# Patient Record
Sex: Male | Born: 1985
Health system: Southern US, Community
[De-identification: ages and names within clinical notes are randomized; demographics above are authoritative.]

## PROBLEM LIST (undated history)

## (undated) DIAGNOSIS — M25552 Pain in left hip: Secondary | ICD-10-CM

## (undated) DIAGNOSIS — G4730 Sleep apnea, unspecified: Secondary | ICD-10-CM

## (undated) DIAGNOSIS — R002 Palpitations: Secondary | ICD-10-CM

## (undated) DIAGNOSIS — E785 Hyperlipidemia, unspecified: Secondary | ICD-10-CM

## (undated) DIAGNOSIS — R079 Chest pain, unspecified: Secondary | ICD-10-CM

## (undated) DIAGNOSIS — R0602 Shortness of breath: Secondary | ICD-10-CM

## (undated) DIAGNOSIS — K219 Gastro-esophageal reflux disease without esophagitis: Secondary | ICD-10-CM

## (undated) DIAGNOSIS — F419 Anxiety disorder, unspecified: Secondary | ICD-10-CM

## (undated) DIAGNOSIS — E538 Deficiency of other specified B group vitamins: Secondary | ICD-10-CM

## (undated) DIAGNOSIS — K760 Fatty (change of) liver, not elsewhere classified: Secondary | ICD-10-CM

## (undated) DIAGNOSIS — T7840XA Allergy, unspecified, initial encounter: Secondary | ICD-10-CM

## (undated) DIAGNOSIS — F329 Major depressive disorder, single episode, unspecified: Secondary | ICD-10-CM

## (undated) DIAGNOSIS — F32A Depression, unspecified: Secondary | ICD-10-CM

## (undated) DIAGNOSIS — E559 Vitamin D deficiency, unspecified: Secondary | ICD-10-CM

## (undated) HISTORY — DX: Pain in left hip: M25.552

## (undated) HISTORY — DX: Hyperlipidemia, unspecified: E78.5

## (undated) HISTORY — DX: Deficiency of other specified B group vitamins: E53.8

## (undated) HISTORY — DX: Palpitations: R00.2

## (undated) HISTORY — DX: Depression, unspecified: F32.A

## (undated) HISTORY — DX: Anxiety disorder, unspecified: F41.9

## (undated) HISTORY — DX: Gastro-esophageal reflux disease without esophagitis: K21.9

## (undated) HISTORY — DX: Fatty (change of) liver, not elsewhere classified: K76.0

## (undated) HISTORY — DX: Allergy, unspecified, initial encounter: T78.40XA

## (undated) HISTORY — DX: Vitamin D deficiency, unspecified: E55.9

## (undated) HISTORY — DX: Sleep apnea, unspecified: G47.30

## (undated) HISTORY — DX: Chest pain, unspecified: R07.9

## (undated) HISTORY — PX: HAIR TRANSPLANT: SHX1719

## (undated) HISTORY — DX: Shortness of breath: R06.02

---
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
## 2017-12-15 DIAGNOSIS — F17220 Nicotine dependence, chewing tobacco, uncomplicated: Secondary | ICD-10-CM

## 2017-12-15 DIAGNOSIS — E669 Obesity, unspecified: Secondary | ICD-10-CM

## 2017-12-15 DIAGNOSIS — G4733 Obstructive sleep apnea (adult) (pediatric): Secondary | ICD-10-CM | POA: Insufficient documentation

## 2017-12-15 HISTORY — DX: Obesity, unspecified: E66.9

## 2017-12-15 HISTORY — DX: Nicotine dependence, chewing tobacco, uncomplicated: F17.220

## 2017-12-15 HISTORY — DX: Obstructive sleep apnea (adult) (pediatric): G47.33

## 2018-02-07 DIAGNOSIS — M898X9 Other specified disorders of bone, unspecified site: Secondary | ICD-10-CM

## 2018-02-07 DIAGNOSIS — M899 Disorder of bone, unspecified: Secondary | ICD-10-CM

## 2018-02-07 HISTORY — DX: Other specified disorders of bone, unspecified site: M89.8X9

## 2018-02-07 HISTORY — DX: Disorder of bone, unspecified: M89.9

## 2018-03-15 DIAGNOSIS — M5432 Sciatica, left side: Secondary | ICD-10-CM

## 2018-03-15 HISTORY — DX: Sciatica, left side: M54.32

## 2019-08-15 ENCOUNTER — Other Ambulatory Visit: Payer: Self-pay

## 2019-08-15 ENCOUNTER — Telehealth: Payer: Self-pay

## 2019-08-15 DIAGNOSIS — Z20822 Contact with and (suspected) exposure to covid-19: Secondary | ICD-10-CM

## 2019-08-15 NOTE — Telephone Encounter (Signed)
Patient called in requesting [place] lab resutls - DOB/Address verified - advised results are still pending - testing was just done today. Reviewed testing protocol and turnaround time with patient, no further questions.

## 2019-08-17 LAB — NOVEL CORONAVIRUS, NAA: SARS-CoV-2, NAA: NOT DETECTED

## 2019-11-12 DIAGNOSIS — G8929 Other chronic pain: Secondary | ICD-10-CM

## 2019-11-12 DIAGNOSIS — M25562 Pain in left knee: Secondary | ICD-10-CM

## 2019-11-12 DIAGNOSIS — M5412 Radiculopathy, cervical region: Secondary | ICD-10-CM | POA: Insufficient documentation

## 2019-11-12 DIAGNOSIS — E669 Obesity, unspecified: Secondary | ICD-10-CM | POA: Insufficient documentation

## 2019-11-12 DIAGNOSIS — M542 Cervicalgia: Secondary | ICD-10-CM

## 2019-11-12 HISTORY — DX: Other chronic pain: G89.29

## 2019-11-12 HISTORY — DX: Pain in left knee: M25.562

## 2019-11-12 HISTORY — DX: Cervicalgia: M54.2

## 2019-11-12 HISTORY — DX: Morbid (severe) obesity due to excess calories: E66.01

## 2019-12-25 DIAGNOSIS — E8881 Metabolic syndrome: Secondary | ICD-10-CM

## 2019-12-25 HISTORY — DX: Metabolic syndrome: E88.81

## 2019-12-25 HISTORY — DX: Metabolic syndrome: E88.810

## 2020-02-15 MED FILL — SAXENDA 18 MG/3 ML PEN: 18 | 30 days supply | Qty: 15 | Fill #0

## 2020-02-15 MED FILL — UNIFINE PENTIPS 32GX5/32: 32G X 4 MM | 90 days supply | Qty: 100 | Fill #0

## 2020-02-21 ENCOUNTER — Encounter (INDEPENDENT_AMBULATORY_CARE_PROVIDER_SITE_OTHER): Payer: Self-pay

## 2020-03-17 ENCOUNTER — Ambulatory Visit (HOSPITAL_BASED_OUTPATIENT_CLINIC_OR_DEPARTMENT_OTHER)
Admission: RE | Admit: 2020-03-17 | Discharge: 2020-03-17 | Disposition: A | Discharge status: Home / Self Care | Payer: No Typology Code available for payment source | Source: Ambulatory Visit | Attending: Medical | Admitting: Medical

## 2020-03-17 ENCOUNTER — Encounter: Payer: Self-pay | Admitting: Medical

## 2020-03-17 ENCOUNTER — Other Ambulatory Visit: Payer: Self-pay

## 2020-03-17 ENCOUNTER — Ambulatory Visit (INDEPENDENT_AMBULATORY_CARE_PROVIDER_SITE_OTHER): Payer: No Typology Code available for payment source | Admitting: Medical

## 2020-03-17 VITALS — BP 121/75 | HR 90 | Temp 98.2°F | Resp 18 | Ht 70.0 in | Wt 246.2 lb

## 2020-03-17 DIAGNOSIS — E785 Hyperlipidemia, unspecified: Secondary | ICD-10-CM

## 2020-03-17 DIAGNOSIS — R0981 Nasal congestion: Secondary | ICD-10-CM

## 2020-03-17 DIAGNOSIS — R0781 Pleurodynia: Secondary | ICD-10-CM | POA: Diagnosis not present

## 2020-03-17 DIAGNOSIS — M542 Cervicalgia: Secondary | ICD-10-CM | POA: Diagnosis not present

## 2020-03-17 DIAGNOSIS — E669 Obesity, unspecified: Secondary | ICD-10-CM

## 2020-03-17 DIAGNOSIS — R0600 Dyspnea, unspecified: Secondary | ICD-10-CM

## 2020-03-17 DIAGNOSIS — M546 Pain in thoracic spine: Secondary | ICD-10-CM

## 2020-03-17 DIAGNOSIS — R0789 Other chest pain: Secondary | ICD-10-CM | POA: Diagnosis not present

## 2020-03-17 DIAGNOSIS — R002 Palpitations: Secondary | ICD-10-CM

## 2020-03-17 MED ORDER — ALBUTEROL SULFATE HFA 108 (90 BASE) MCG/ACT IN AERS: 2.0000 | INHALATION_SPRAY | Freq: Four times a day (QID) | RESPIRATORY_TRACT | 0 refills | Status: DC | PRN

## 2020-03-17 MED ORDER — AZELASTINE HCL 0.1 % NA SOLN: 2.0000 | Freq: Two times a day (BID) | NASAL | 2 refills | Status: DC

## 2020-03-17 NOTE — Addendum Note (Signed)
Addended by: Gwenevere Abbot on: 03/17/2020 10:23 AM   Modules accepted: Orders

## 2020-03-17 NOTE — Patient Instructions (Addendum)
You have history of chronic intermittent atypical chest pain for years per your report with some intermittent palpitations as well.  Your EKG today showed normal sinus rhythm.  Work-up previously done in February with [facility] was negative.  They were in the process of referring you to cardiologist as well as pulmonologist since you note some shortness of breath as well.  I went ahead and placed referral to both cardiologist and pulmonologist today.  Recommend that you avoid any caffeinated beverages or any stimulant type products.  Particularly avoid any Sudafed.  Since you do have recent shortness of breath with occasional wheezing will make albuterol inhaler available to use if needed pending pulmonologist referral.  For thoracic back pain that chronic and intermittent with recent failure of PT, I decided to go ahead and refer you to sports medicine.  For obesity, continue Saxenda and referred you to weight loss management clinic.  For hyperlipidemia, recommend low-cholesterol diet and can continue fish oil.  You mention chronic nasal congestion.  You report being on Nasacort and decided go ahead and prescribe Astelin.  If you have any worsening type chest pain or severe shortness of breath prior to referral to specialist and recommend ED evaluation.  Presently pending both referrals I do not want to do any heavy exercises.  Follow-up in 3 weeks or as needed.

## 2020-03-17 NOTE — Addendum Note (Signed)
Addended by: Gwenevere Abbot on: 03/17/2020 10:19 AM   Modules accepted: Orders

## 2020-03-17 NOTE — Progress Notes (Signed)
Subjective:    Patient ID: Brian Obrien, male    DOB: [birth year], 34 y.o.   MRN: [id number]  HPI  Pt in for first time.   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 Pt does not exercise regularly in past but recently started riding bike 3 times a week. Pt states trying to eat healthy. Admits moderate high carbs. Eats chicken and goat. Likes fish. Single. Pt used to smoke but stopped in 2013. Smoked for about 5 ears. Then chewed tobacco for about 7 years. No alcohol.   Pt born and raised in [place].   Pt has high cholesterol in the past.   Pt in past saw weight loss MD. He was on saxenda.  Pt was with [facility] and he was in process of being referred to cardiologist but pulmonologist but never saw them due to insurance change.  He states he has minimal shortness of breath even with minimal activity(had normal cxr in feb 2021). He does feel some nasal congestion for 2 + years.. Uses nasacort. Pt does occasionally hear him self wheeze. Pt gets random intermittent chest pain that will occur 5-6 seconds then subside. Some pleuritic type description as well if takes deep breath will get pain. Some palpitation. Both atypical chest pain and palpitation since 2013.  February saw pcp.   1. Chest pain, unspecified type XR Chest Pa And Lateral  BNP (B-Type Natruiretic Peptide)  BNP (B-Type Natruiretic Peptide)  CANCELED: D-dimer  2. SOB (shortness of breath) XR Chest Pa And Lateral  BNP (B-Type Natruiretic Peptide)  D-dimer  BNP (B-Type Natruiretic Peptide)  D-dimer  CANCELED: D-dimer  3. Chest pain varying with breathing Troponin T  D-dimer  D-dimer  Troponin T  4. Dizziness CBC, Platelet; No Differential  Comprehensive metabolic panel  Comprehensive metabolic panel  CBC, Platelet; No Differential  5. Tachycardia TSH  TSH   P: Patient urged to go to the ER for further evaluation, however he declined this. He is able to present to the [facility] on Monday AM for an EKG. He was urged to be  evaluated prior should his symptoms worsen or persist Labs obtained and pending as patient declined ER evaluation Patient is aware of the risk of declining urgent evaluation at the ER Will follow as patient allows  I reviewed pt lab d dimer which was negative. bnp was negative. Troponin not done. ekg summar state no st changes. Nsr. But I don't actually see the ekg.    Pt also has back pain and saw PT for 7 weeks and mid thoracic pain not improving.  Pt taking tylenol for pain. Was on muscle relaxer in past and sometime motrin 800 mg.   Review of Systems  Constitutional: Negative for chills, fatigue and fever.  HENT: Negative for congestion, drooling, ear discharge, nosebleeds and sneezing.   Respiratory: Negative for cough, chest tightness, shortness of breath and wheezing.   Cardiovascular: Negative for chest pain and palpitations.  Gastrointestinal: Negative for abdominal pain, blood in stool, diarrhea and rectal pain.  Genitourinary: Negative for flank pain, frequency and urgency.  Musculoskeletal: Negative for back pain.  Skin: Negative for rash.  Neurological: Negative for dizziness, speech difficulty, weakness, numbness and headaches.  Hematological: Negative for adenopathy. Does not bruise/bleed easily.  Psychiatric/Behavioral: Negative for behavioral problems, confusion and suicidal ideas. The patient is not nervous/anxious.     No past medical history on file.   Social History   Socioeconomic History  . Marital status: Single  Spouse name: Not on file  . Number of children: Not on file  . Years of education: Not on file  . Highest education level: Not on file  Occupational History  . Not on file  Tobacco Use  . Smoking status: Not on file  Substance and Sexual Activity  . Alcohol use: Not on file  . Drug use: Not on file  . Sexual activity: Not on file  Other Topics Concern  . Not on file  Social History Narrative  . Not on file   Social Determinants of  Health   Financial Resource Strain:   . Difficulty of Paying Living Expenses:   Food Insecurity:   . Worried About Charity fundraiser in the Last Year:   . Arboriculturist in the Last Year:   Transportation Needs:   . Film/video editor (Medical):   Marland Kitchen Lack of Transportation (Non-Medical):   Physical Activity:   . Days of Exercise per Week:   . Minutes of Exercise per Session:   Stress:   . Feeling of Stress :   Social Connections:   . Frequency of Communication with Friends and Family:   . Frequency of Social Gatherings with Friends and Family:   . Attends Religious Services:   . Active Member of Clubs or Organizations:   . Attends Archivist Meetings:   Marland Kitchen Marital Status:   Intimate Partner Violence:   . Fear of Current or Ex-Partner:   . Emotionally Abused:   Marland Kitchen Physically Abused:   . Sexually Abused:      No family history on file.  Not on File  Current Outpatient Medications on File Prior to Visit  Medication Sig Dispense Refill  . Omega-3 Fatty Acids (FISH OIL) 1000 MG CAPS Take by mouth.    Marland Kitchen SAXENDA 18 MG/3ML SOPN Inject 3 mg into the skin daily.     No current facility-administered medications on file prior to visit.    BP 121/75 (BP Location: Left Arm, Patient Position: Sitting, Cuff Size: Large)   Pulse 90   Temp 98.2 F (36.8 C) (Temporal)   Resp 18   Ht 5\' 10"  (1.778 m)   Wt 246 lb 3.2 oz (111.7 kg)   SpO2 99%   BMI 35.33 kg/m       Objective:   Physical Exam  General Mental Status- Alert. General Appearance- Not in acute distress.   Skin General: Color- Normal Color. Moisture- Normal Moisture.  Neck Carotid Arteries- Normal color. Moisture- Normal Moisture. No carotid bruits. No JVD.  Chest and Lung Exam Auscultation: Breath Sounds:-Normal.  Cardiovascular Auscultation:Rythm- Regular. Murmurs & Other Heart Sounds:Auscultation of the heart reveals- No Murmurs.  Abdomen Inspection:-Inspeection  Normal. Palpation/Percussion:Note:No mass. Palpation and Percussion of the abdomen reveal- Non Tender, Non Distended + BS, no rebound or guarding.   Neurologic Cranial Nerve exam:- CN III-XII intact(No nystagmus), symmetric smile. Strength:- 5/5 equal and symmetric strength both upper and lower extremities.  Lower ext- calfs not swollen. No pedal edema. Negative homans signs.        Assessment & Plan:  You have history of chronic intermittent atypical chest pain for years per your report with some intermittent palpitations as well.  Your EKG today showed normal sinus rhythm.  Work-up previously done in February with [facility] was negative.  They were in the process of referring you to cardiologist as well as pulmonologist since you note some shortness of breath as well.  I went ahead and placed  referral to both cardiologist and pulmonologist today.  Recommend that you avoid any caffeinated beverages or any stimulant type products.  Particularly avoid any Sudafed.  Since you do have recent shortness of breath with occasional wheezing will make albuterol inhaler available to use if needed pending pulmonologist referral.  For thoracic back pain that chronic and intermittent with recent failure of PT, I decided to go ahead and refer you to sports medicine.  For obesity, continue Saxenda and referred you to weight loss management clinic.  For hyperlipidemia, recommend low-cholesterol diet and can continue fish oil.  You mention chronic nasal congestion.  You report being on Nasacort and decided go ahead and prescribe Astelin.  If you have any worsening type chest pain or severe shortness of breath prior to referral to specialist and recommend ED evaluation.  Presently pending both referrals I do not want to do any heavy exercises.  Follow-up in 3 weeks or as needed.  Time spent with new patient today was 45  minutes which consisted of chart rediew, discussing various  diagnosis, referrals,  reviewed ekg, treatments discussed and documentation.

## 2020-03-18 ENCOUNTER — Other Ambulatory Visit: Payer: Self-pay

## 2020-03-19 ENCOUNTER — Encounter: Payer: Self-pay | Admitting: Cardiology

## 2020-03-19 ENCOUNTER — Ambulatory Visit (INDEPENDENT_AMBULATORY_CARE_PROVIDER_SITE_OTHER): Payer: No Typology Code available for payment source | Admitting: Cardiology

## 2020-03-19 ENCOUNTER — Other Ambulatory Visit: Payer: Self-pay

## 2020-03-19 VITALS — BP 100/70 | HR 77 | Ht 70.0 in | Wt 244.0 lb

## 2020-03-19 DIAGNOSIS — R0789 Other chest pain: Secondary | ICD-10-CM

## 2020-03-19 DIAGNOSIS — E669 Obesity, unspecified: Secondary | ICD-10-CM

## 2020-03-19 DIAGNOSIS — R002 Palpitations: Secondary | ICD-10-CM

## 2020-03-19 DIAGNOSIS — E8881 Metabolic syndrome: Secondary | ICD-10-CM | POA: Diagnosis not present

## 2020-03-19 DIAGNOSIS — G4733 Obstructive sleep apnea (adult) (pediatric): Secondary | ICD-10-CM

## 2020-03-19 NOTE — Patient Instructions (Signed)
Medication Instructions:  Your physician recommends that you continue on your current medications as directed. Please refer to the Current Medication list given to you today.  *If you need a refill on your cardiac medications before your next appointment, please call your pharmacy*   Lab Work: Your physician recommends that you return for lab work before next appointment: FASTING bmp, mg, tsh, lipids (no appt needed)   If you have labs (blood work) drawn today and your tests are completely normal, you will receive your results only by: Marland Kitchen MyChart Message (if you have MyChart) OR . A paper copy in the mail If you have any lab test that is abnormal or we need to change your treatment, we will call you to review the results.   Testing/Procedures: A zio monitor was ordered today. It will remain on for 7 days. You will then return monitor and event diary in provided box. It takes 1-2 weeks for report to be downloaded and returned to [place]. We will call you with the results. If monitor falls off or has orange flashing light, please call [employer]io for further instructions.   Your physician has requested that you have an echocardiogram. Echocardiography is a painless test that uses sound waves to create images of your heart. It provides your doctor with information about the size and shape of your heart and how well your heart's chambers and valves are working. This procedure takes approximately one hour. There are no restrictions for this procedure.      Follow-Up: At [facility], you and your health needs are our priority.  As part of our continuing mission to provide you with exceptional heart care, we have created designated Provider Care Teams.  These Care Teams include your primary Cardiologist (physician) and Advanced Practice Providers (APPs -  Physician Assistants and Nurse Practitioners) who all work together to provide you with the care you need, when you need it.  We recommend signing up for  the patient portal called "MyChart".  Sign up information is provided on this After Visit Summary.  MyChart is used to connect with patients for Virtual Visits (Telemedicine).  Patients are able to view lab/test results, encounter notes, upcoming appointments, etc.  Non-urgent messages can be sent to your provider as well.   To learn more about what you can do with MyChart, go to [email or web address].    Your next appointment:   2 month(s)  The format for your next appointment:   In Person  Provider:   Berniece Salines, DO   Other Instructions   Echocardiogram An echocardiogram is a procedure that uses painless sound waves (ultrasound) to produce an image of the heart. Images from an echocardiogram can provide important information about:  Signs of coronary artery disease (CAD).  Aneurysm detection. An aneurysm is a weak or damaged part of an artery wall that bulges out from the normal force of blood pumping through the body.  Heart size and shape. Changes in the size or shape of the heart can be associated with certain conditions, including heart failure, aneurysm, and CAD.  Heart muscle function.  Heart valve function.  Signs of a past heart attack.  Fluid buildup around the heart.  Thickening of the heart muscle.  A tumor or infectious growth around the heart valves. Tell a health care provider about:  Any allergies you have.  All medicines you are taking, including vitamins, herbs, eye drops, creams, and over-the-counter medicines.  Any blood disorders you have.  Any surgeries you  have had.  Any medical conditions you have.  Whether you are pregnant or may be pregnant. What are the risks? Generally, this is a safe procedure. However, problems may occur, including:  Allergic reaction to dye (contrast) that may be used during the procedure. What happens before the procedure? No specific preparation is needed. You may eat and drink normally. What happens during  the procedure?   An IV tube may be inserted into one of your veins.  You may receive contrast through this tube. A contrast is an injection that improves the quality of the pictures from your heart.  A gel will be applied to your chest.  A wand-like tool (transducer) will be moved over your chest. The gel will help to transmit the sound waves from the transducer.  The sound waves will harmlessly bounce off of your heart to allow the heart images to be captured in real-time motion. The images will be recorded on a computer. The procedure may vary among health care providers and hospitals. What happens after the procedure?  You may return to your normal, everyday life, including diet, activities, and medicines, unless your health care provider tells you not to do that. Summary  An echocardiogram is a procedure that uses painless sound waves (ultrasound) to produce an image of the heart.  Images from an echocardiogram can provide important information about the size and shape of your heart, heart muscle function, heart valve function, and fluid buildup around your heart.  You do not need to do anything to prepare before this procedure. You may eat and drink normally.  After the echocardiogram is completed, you may return to your normal, everyday life, unless your health care provider tells you not to do that. This information is not intended to replace advice given to you by your health care provider. Make sure you discuss any questions you have with your health care provider. Document Revised: 03/01/2019 Document Reviewed: 12/11/2016 Elsevier Patient Education  2020 [employer].

## 2020-03-19 NOTE — Progress Notes (Signed)
Cardiology Office Note:    Date:  03/19/2020   ID:  Faysal, Fenoglio 1985/12/02, MRN [id number]  PCP:  Esperanza Richters, PA-C  Cardiologist:  Thomasene Ripple, DO  Electrophysiologist:  None   Referring MD: Marisue Brooklyn   Chief Complaint  Patient presents with  . New Patient (Initial Visit)    History of Present Illness:    Brian Obrien is a 34 y.o. male with a hx of hyperlipidemia, reports many years of intermittent palpitations along with chest pain.  Patient tells me that he has been experiencing intermittent palpitations which he describes inform onset of fast heartbeat which last for few minutes at a time.  Mostly he notes that this is happening at night.  In addition the chest pain he describes as a lateral wall under the breast sharp pain that has been going on since 2015.  He notes that this happens mostly when he is driving it occurs within seconds and resolve.  Sometimes is associated with palpitations but not all.  Was more bothersome is the fact that he notes that he has been experiencing shortness of breath since the fall which has not resolved.  At times he tells me that it can occur at rest and also with exertion.  He tells me he has been more anxious recently as he has just gone through a divorce and things had not been completely settled with him. No other complaints at this time.  Past Medical History:  Diagnosis Date  . Allergy   . 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  . Chronic neck pain 11/12/2019  . Chronic pain of left knee 11/12/2019  . Dependence on nicotine from chewing tobacco 12/15/2017  . Hyperlipidemia   . Left sided sciatica 03/15/2018  . Lytic bone lesions on xray 02/07/2018   Formatting of this note might be different from the original. Of right iliac crest.  Seen on x-ray in 1-19 and CT on 02/05/2018. Unchanged xray in 03/2018. Repeat in 6 months (CT vs Xray)  . Metabolic syndrome 12/25/2019  . Obesity (BMI 30-39.9)  12/15/2017  . OSA (obstructive sleep apnea) 12/15/2017   2015; unable to tolerate CPAP  . Severe obesity (BMI 35.0-39.9) with comorbidity (HCC) 11/12/2019    History reviewed. No pertinent surgical history.  Current Medications: Current Meds  Medication Sig  . albuterol (VENTOLIN HFA) 108 (90 Base) MCG/ACT inhaler Inhale 2 puffs into the lungs every 6 (six) hours as needed.  Marland Kitchen azelastine (ASTELIN) 0.1 % nasal spray Place 2 sprays into both nostrils 2 (two) times daily. Use in each nostril as directed  . Cholecalciferol 125 MCG (5000 UT) capsule Take by mouth.  . cyclobenzaprine (FLEXERIL) 10 MG tablet Take 10 mg by mouth at bedtime.  Marland Kitchen loratadine (CLARITIN) 10 MG tablet Take 10 mg by mouth daily.  . Multiple Vitamin (MULTIVITAMIN) tablet Take 1 tablet by mouth every other day.  . Omega-3 Fatty Acids (FISH OIL) 1000 MG CAPS Take by mouth.  Marland Kitchen SAXENDA 18 MG/3ML SOPN Inject 3 mg into the skin daily.     Allergies:   Patient has no allergy information on record.   Social History   Socioeconomic History  . Marital status: Single    Spouse name: Not on file  . Number of children: Not on file  . Years of education: Not on file  . Highest education level: Not on file  Occupational History  . Occupation: Set designer  Tobacco Use  . Smoking status:  Former Smoker    Packs/day: 0.25    Types: Cigarettes    Quit date: 03/06/2012    Years since quitting: 8.0  . Smokeless tobacco: Former Systems developer    Types: [place] date: 03/17/2016  Substance and Sexual Activity  . Alcohol use: Not Currently  . Drug use: Never  . Sexual activity: Not on file  Other Topics Concern  . Not on file  Social History Narrative  . Not on file   Social Determinants of Health   Financial Resource Strain:   . Difficulty of Paying Living Expenses:   Food Insecurity:   . Worried About Charity fundraiser in the Last Year:   . Arboriculturist in the Last Year:   Transportation Needs:   . Film/video editor  (Medical):   Marland Kitchen Lack of Transportation (Non-Medical):   Physical Activity:   . Days of Exercise per Week:   . Minutes of Exercise per Session:   Stress:   . Feeling of Stress :   Social Connections:   . Frequency of Communication with Friends and Family:   . Frequency of Social Gatherings with Friends and Family:   . Attends Religious Services:   . Active Member of Clubs or Organizations:   . Attends Archivist Meetings:   Marland Kitchen Marital Status:      Family History: The patient's family history is not on file.  ROS:   Review of Systems  Constitution: Negative for decreased appetite, fever and weight gain.  HENT: Negative for congestion, ear discharge, hoarse voice and sore throat.   Eyes: Negative for discharge, redness, vision loss in right eye and visual halos.  Cardiovascular: Negative for chest pain, dyspnea on exertion, leg swelling, orthopnea and palpitations.  Respiratory: Negative for cough, hemoptysis, shortness of breath and snoring.   Endocrine: Negative for heat intolerance and polyphagia.  Hematologic/Lymphatic: Negative for bleeding problem. Does not bruise/bleed easily.  Skin: Negative for flushing, nail changes, rash and suspicious lesions.  Musculoskeletal: Negative for arthritis, joint pain, muscle cramps, myalgias, neck pain and stiffness.  Gastrointestinal: Negative for abdominal pain, bowel incontinence, diarrhea and excessive appetite.  Genitourinary: Negative for decreased libido, genital sores and incomplete emptying.  Neurological: Negative for brief paralysis, focal weakness, headaches and loss of balance.  Psychiatric/Behavioral: Negative for altered mental status, depression and suicidal ideas.  Allergic/Immunologic: Negative for HIV exposure and persistent infections.    EKGs/Labs/Other Studies Reviewed:    The following studies were reviewed today:   EKG:  The ekg ordered today demonstrates   Recent Labs: No results found for requested  labs within last 8760 hours.  Recent Lipid Panel No results found for: CHOL, TRIG, HDL, CHOLHDL, VLDL, LDLCALC, LDLDIRECT  Physical Exam:    VS:  BP 100/70   Pulse 77   Ht 5\' 10"  (1.778 m)   Wt 244 lb (110.7 kg)   SpO2 99%   BMI 35.01 kg/m     Wt Readings from Last 3 Encounters:  03/19/20 244 lb (110.7 kg)  03/17/20 246 lb 3.2 oz (111.7 kg)     GEN: Well nourished, well developed in no acute distress HEENT: Normal NECK: No JVD; No carotid bruits LYMPHATICS: No lymphadenopathy CARDIAC: S1S2 noted,RRR, no murmurs, rubs, gallops RESPIRATORY:  Clear to auscultation without rales, wheezing or rhonchi  ABDOMEN: Soft, non-tender, non-distended, +bowel sounds, no guarding. EXTREMITIES: No edema, No cyanosis, no clubbing MUSCULOSKELETAL:  No deformity  SKIN: Warm and dry NEUROLOGIC:  Alert  and oriented x 3, non-focal PSYCHIATRIC:  Normal affect, good insight  ASSESSMENT:    1. Palpitations   2. Atypical chest pain   3. Metabolic syndrome   4. Obesity (BMI 30-39.9)   5. Severe obesity (BMI 35.0-39.9) with comorbidity (HCC)   6. OSA (obstructive sleep apnea)    PLAN:   1.5 I would like to rule out a cardiovascular etiology of this palpitation, therefore at this time I would like to placed a zio patch for  7 days. In additon a transthoracic echocardiogram will be ordered to assess LV/RV function and any structural abnormalities. Once these testing have been performed amd reviewed further reccomendations will be made. For now, I do reccomend that the patient goes to the nearest ED if  symptoms recur.  His chest pain does sound atypical.  For now we will review the echocardiogram for any wall motion abnormalities.  We will continue to monitor symptoms persist will be assess the need for further evaluation for his atypical chest pain.  Obesity-the patient understands the need to lose weight with diet and exercise. We have discussed specific strategies for this.  OSA-currently not  compliant with CPAP.  Did discuss with the patient importance of using the CPAP.  Lab work will be done today which would include BMP, mag, TSH and lipid profile.  The patient is in agreement with the above plan. The patient left the office in stable condition.  The patient will follow up in 3 months or sooner if needed.   Medication Adjustments/Labs and Tests Ordered: Current medicines are reviewed at length with the patient today.  Concerns regarding medicines are outlined above.  Orders Placed This Encounter  Procedures  . Basic metabolic panel  . Magnesium  . TSH  . Lipid Profile  . LONG TERM MONITOR (3-14 DAYS)  . EKG 12-Lead  . ECHOCARDIOGRAM COMPLETE   No orders of the defined types were placed in this encounter.   Patient Instructions  Medication Instructions:  Your physician recommends that you continue on your current medications as directed. Please refer to the Current Medication list given to you today.  *If you need a refill on your cardiac medications before your next appointment, please call your pharmacy*   Lab Work: Your physician recommends that you return for lab work before next appointment: FASTING bmp, mg, tsh, lipids (no appt needed)   If you have labs (blood work) drawn today and your tests are completely normal, you will receive your results only by: Marland Kitchen. MyChart Message (if you have MyChart) OR . A paper copy in the mail If you have any lab test that is abnormal or we need to change your treatment, we will call you to review the results.   Testing/Procedures: A zio monitor was ordered today. It will remain on for 7 days. You will then return monitor and event diary in provided box. It takes 1-2 weeks for report to be downloaded and returned to us. We will call you with the results. If monitor falls off or has orange flashing light, please call Zio for further instructions.   Your physician has requested that you have an echocardiogram. Echocardiography  is a painless test that uses sound waves to create images of your heart. It provides your doctor with information about the size and shape of your heart and how well your heart's chambers and valves are working. This procedure takes approximately one hour. There are no restrictions for this procedure.      Follow-Up:  At [facility], you and your health needs are our priority.  As part of our continuing mission to provide you with exceptional heart care, we have created designated Provider Care Teams.  These Care Teams include your primary Cardiologist (physician) and Advanced Practice Providers (APPs -  Physician Assistants and Nurse Practitioners) who all work together to provide you with the care you need, when you need it.  We recommend signing up for the patient portal called "MyChart".  Sign up information is provided on this After Visit Summary.  MyChart is used to connect with patients for Virtual Visits (Telemedicine).  Patients are able to view lab/test results, encounter notes, upcoming appointments, etc.  Non-urgent messages can be sent to your provider as well.   To learn more about what you can do with MyChart, go to [email or web address].    Your next appointment:   2 month(s)  The format for your next appointment:   In Person  Provider:   Thomasene Ripple, DO   Other Instructions   Echocardiogram An echocardiogram is a procedure that uses painless sound waves (ultrasound) to produce an image of the heart. Images from an echocardiogram can provide important information about:  Signs of coronary artery disease (CAD).  Aneurysm detection. An aneurysm is a weak or damaged part of an artery wall that bulges out from the normal force of blood pumping through the body.  Heart size and shape. Changes in the size or shape of the heart can be associated with certain conditions, including heart failure, aneurysm, and CAD.  Heart muscle function.  Heart valve function.  Signs  of a past heart attack.  Fluid buildup around the heart.  Thickening of the heart muscle.  A tumor or infectious growth around the heart valves. Tell a health care provider about:  Any allergies you have.  All medicines you are taking, including vitamins, herbs, eye drops, creams, and over-the-counter medicines.  Any blood disorders you have.  Any surgeries you have had.  Any medical conditions you have.  Whether you are pregnant or may be pregnant. What are the risks? Generally, this is a safe procedure. However, problems may occur, including:  Allergic reaction to dye (contrast) that may be used during the procedure. What happens before the procedure? No specific preparation is needed. You may eat and drink normally. What happens during the procedure?   An IV tube may be inserted into one of your veins.  You may receive contrast through this tube. A contrast is an injection that improves the quality of the pictures from your heart.  A gel will be applied to your chest.  A wand-like tool (transducer) will be moved over your chest. The gel will help to transmit the sound waves from the transducer.  The sound waves will harmlessly bounce off of your heart to allow the heart images to be captured in real-time motion. The images will be recorded on a computer. The procedure may vary among health care providers and hospitals. What happens after the procedure?  You may return to your normal, everyday life, including diet, activities, and medicines, unless your health care provider tells you not to do that. Summary  An echocardiogram is a procedure that uses painless sound waves (ultrasound) to produce an image of the heart.  Images from an echocardiogram can provide important information about the size and shape of your heart, heart muscle function, heart valve function, and fluid buildup around your heart.  You do not need to do anything  to prepare before this procedure.  You may eat and drink normally.  After the echocardiogram is completed, you may return to your normal, everyday life, unless your health care provider tells you not to do that. This information is not intended to replace advice given to you by your health care provider. Make sure you discuss any questions you have with your health care provider. Document Revised: 03/01/2019 Document Reviewed: 12/11/2016 Elsevier Patient Education  2020 [employer].      Adopting a Healthy Lifestyle.  Know what a healthy weight is for you (roughly BMI <25) and aim to maintain this   Aim for 7+ servings of fruits and vegetables daily   65-80+ fluid ounces of water or unsweet tea for healthy kidneys   Limit to max 1 drink of alcohol per day; avoid smoking/tobacco   Limit animal fats in diet for cholesterol and heart health - choose grass fed whenever available   Avoid highly processed foods, and foods high in saturated/trans fats   Aim for low stress - take time to unwind and care for your mental health   Aim for 150 min of moderate intensity exercise weekly for heart health, and weights twice weekly for bone health   Aim for 7-9 hours of sleep daily   When it comes to diets, agreement about the perfect plan isnt easy to find, even among the experts. Experts at the [facility] of [employer] developed an idea known as the Healthy Eating Plate. Just imagine a plate divided into logical, healthy portions.   The emphasis is on diet quality:   Load up on vegetables and fruits - one-half of your plate: Aim for color and variety, and remember that potatoes dont count.   Go for whole grains - one-quarter of your plate: Whole wheat, barley, wheat berries, quinoa, oats, brown rice, and foods made with them. If you want pasta, go with whole wheat pasta.   Protein power - one-quarter of your plate: Fish, chicken, beans, and nuts are all healthy, versatile protein sources. Limit red meat.   The  diet, however, does go beyond the plate, offering a few other suggestions.   Use healthy plant oils, such as olive, canola, soy, corn, sunflower and peanut. Check the labels, and avoid partially hydrogenated oil, which have unhealthy trans fats.   If youre thirsty, drink water. Coffee and tea are good in moderation, but skip sugary drinks and limit milk and dairy products to one or two daily servings.   The type of carbohydrate in the diet is more important than the amount. Some sources of carbohydrates, such as vegetables, fruits, whole grains, and beans-are healthier than others.   Finally, stay active  Signed, Thomasene Ripple, DO  03/19/2020 8:04 PM     [facility]

## 2020-03-20 ENCOUNTER — Telehealth: Payer: Self-pay | Admitting: Emergency Medicine

## 2020-03-20 LAB — LIPID PANEL
Chol/HDL Ratio: 4.6 ratio (ref 0.0–5.0)
Cholesterol, Total: 161 mg/dL (ref 100–199)
HDL: 35 mg/dL — ABNORMAL LOW (ref >39)
LDL Chol Calc (NIH): 96 mg/dL (ref 0–99)
Triglycerides: 175 mg/dL — ABNORMAL HIGH (ref 0–149)
VLDL Cholesterol Cal: 30 mg/dL (ref 5–40)

## 2020-03-20 LAB — BASIC METABOLIC PANEL
BUN/Creatinine Ratio: 16 (ref 9–20)
BUN: 14 mg/dL (ref 6–20)
CO2: 26 mmol/L (ref 20–29)
Calcium: 10.1 mg/dL (ref 8.7–10.2)
Chloride: 100 mmol/L (ref 96–106)
Creatinine, Ser: 0.88 mg/dL (ref 0.76–1.27)
GFR calc Af Amer: 132 mL/min/{1.73_m2} (ref >59)
GFR calc non Af Amer: 114 mL/min/{1.73_m2} (ref >59)
Glucose: 77 mg/dL (ref 65–99)
Potassium: 4.1 mmol/L (ref 3.5–5.2)
Sodium: 142 mmol/L (ref 134–144)

## 2020-03-20 LAB — MAGNESIUM: Magnesium: 2.3 mg/dL (ref 1.6–2.3)

## 2020-03-20 LAB — TSH: TSH: 3.22 u[IU]/mL (ref 0.450–4.500)

## 2020-03-20 NOTE — Telephone Encounter (Signed)
Called patient with results of lab work. During the call patient also agreed to have monitor mailed to him and he will wear for a 5 day period at the end eof May 2021 when he will be off work. Will register and have this mailed to him.

## 2020-03-26 ENCOUNTER — Ambulatory Visit (HOSPITAL_BASED_OUTPATIENT_CLINIC_OR_DEPARTMENT_OTHER)
Admission: RE | Admit: 2020-03-26 | Discharge: 2020-03-26 | Disposition: A | Discharge status: Home / Self Care | Payer: No Typology Code available for payment source | Source: Ambulatory Visit | Attending: Cardiology | Admitting: Cardiology

## 2020-03-26 ENCOUNTER — Telehealth: Payer: Self-pay

## 2020-03-26 ENCOUNTER — Other Ambulatory Visit: Payer: Self-pay

## 2020-03-26 DIAGNOSIS — G4733 Obstructive sleep apnea (adult) (pediatric): Secondary | ICD-10-CM | POA: Insufficient documentation

## 2020-03-26 DIAGNOSIS — R002 Palpitations: Secondary | ICD-10-CM | POA: Diagnosis not present

## 2020-03-26 DIAGNOSIS — R0789 Other chest pain: Secondary | ICD-10-CM | POA: Diagnosis not present

## 2020-03-26 NOTE — Telephone Encounter (Signed)
-----   Message from Thomasene Ripple, DO sent at 03/26/2020 11:21 AM EDT ----- Good results-your echo is normal.

## 2020-03-26 NOTE — Progress Notes (Signed)
  Echocardiogram 2D Echocardiogram has been performed.  Sinda Du 03/26/2020, 9:09 AM

## 2020-03-26 NOTE — Telephone Encounter (Signed)
Spoke with patient regarding results.  Patient verbalizes understanding and is agreeable to plan of care. Advised patient to call back with any issues or concerns.  

## 2020-03-31 ENCOUNTER — Encounter: Payer: Self-pay | Admitting: Medical

## 2020-03-31 NOTE — Telephone Encounter (Signed)
Weight loss clinic referral was not dropped last OV

## 2020-04-01 ENCOUNTER — Telehealth: Payer: Self-pay | Admitting: Medical

## 2020-04-02 ENCOUNTER — Telehealth: Payer: Self-pay | Admitting: Medical

## 2020-04-02 DIAGNOSIS — E669 Obesity, unspecified: Secondary | ICD-10-CM

## 2020-04-02 MED ORDER — SAXENDA 18 MG/3ML ~~LOC~~ SOPN: 3.0000 mg | PEN_INJECTOR | Freq: Every day | SUBCUTANEOUS | 1 refills | Status: DC

## 2020-04-02 NOTE — Telephone Encounter (Signed)
Rx saxenda refill sent to pt pharmacy.

## 2020-04-03 ENCOUNTER — Ambulatory Visit: Payer: No Typology Code available for payment source | Admitting: Family Medicine

## 2020-04-03 ENCOUNTER — Other Ambulatory Visit: Payer: Self-pay

## 2020-04-03 VITALS — BP 122/76 | Ht 70.0 in | Wt 245.0 lb

## 2020-04-03 DIAGNOSIS — M542 Cervicalgia: Secondary | ICD-10-CM | POA: Diagnosis not present

## 2020-04-03 DIAGNOSIS — G8929 Other chronic pain: Secondary | ICD-10-CM

## 2020-04-03 DIAGNOSIS — M5442 Lumbago with sciatica, left side: Secondary | ICD-10-CM | POA: Diagnosis not present

## 2020-04-03 DIAGNOSIS — M2559 Pain in other specified joint: Secondary | ICD-10-CM

## 2020-04-03 DIAGNOSIS — M2550 Pain in unspecified joint: Secondary | ICD-10-CM | POA: Insufficient documentation

## 2020-04-03 MED ORDER — GABAPENTIN 300 MG PO CAPS: 300.0000 mg | ORAL_CAPSULE | Freq: Three times a day (TID) | ORAL | 1 refills | Status: DC

## 2020-04-03 NOTE — Assessment & Plan Note (Signed)
Has tried physical therapy and muscle relaxers.  Appears to have loss of his cervical lordosis. -Counseled on home exercise therapy and supportive care. -MRI to evaluate for degenerative changes or nerve impingement.

## 2020-04-03 NOTE — Progress Notes (Signed)
Brian Obrien - 34 y.o. male MRN [id number]  Date of birth: 1986/11/18  SUBJECTIVE:  Including CC & ROS.  No chief complaint on file.   Brian Obrien is a 34 y.o. male that is presenting with acute on chronic neck pain and acute on chronic low back pain.  The pain is been ongoing several years.  He has tried physical therapy and different medications.  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  Denies any radicular symptoms.  The low back pain is occurring mainly on the left side.  It is worse in the morning and he is stiff.  Through the course the day it improves.  He reports a possible hemangioma in one of the lumbar vertebra's.  Has some left-sided sciatica intermittently..  Review of the lumbar spine x-ray from 11/12/2019 shows mild disc narrowing at T12-L1 and L1-L2.  There are mild degenerative changes of the right SI joint. Independent review of the cervical spine x-ray from 4/26 shows loss of the cervical lordosis.  Review of Systems See HPI   HISTORY: Past Medical, Surgical, Social, and Family History Reviewed & Updated per EMR.   Pertinent Historical Findings include:  Past Medical History:  Diagnosis Date  . Allergy   . 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  . Chronic neck pain 11/12/2019  . Chronic pain of left knee 11/12/2019  . Dependence on nicotine from chewing tobacco 12/15/2017  . Hyperlipidemia   . Left sided sciatica 03/15/2018  . Lytic bone lesions on xray 02/07/2018   Formatting of this note might be different from the original. Of right iliac crest.  Seen on x-ray in 1-19 and CT on 02/05/2018. Unchanged xray in 03/2018. Repeat in 6 months (CT vs Xray)  . Metabolic syndrome 12/25/2019  . Obesity (BMI 30-39.9) 12/15/2017  . OSA (obstructive sleep apnea) 12/15/2017   2015; unable to tolerate CPAP  . Severe obesity (BMI 35.0-39.9) with comorbidity (HCC) 11/12/2019    No past surgical  history on file.  No family history on file.  Social History   Socioeconomic History  . Marital status: Single    Spouse name: Not on file  . Number of children: Not on file  . Years of education: Not on file  . Highest education level: Not on file  Occupational History  . Occupation: Set designer  Tobacco Use  . Smoking status: Former Smoker    Packs/day: 0.25    Types: Cigarettes    Quit date: 03/06/2012    Years since quitting: 8.0  . Smokeless tobacco: Former Neurosurgeon    Types: Chew    Quit date: 03/17/2016  Substance and Sexual Activity  . Alcohol use: Not Currently  . Drug use: Never  . Sexual activity: Not on file  Other Topics Concern  . Not on file  Social History Narrative  . Not on file   Social Determinants of Health   Financial Resource Strain:   . Difficulty of Paying Living Expenses:   Food Insecurity:   . Worried About Programme researcher, broadcasting/film/video in the Last Year:   . Barista in the Last Year:   Transportation Needs:   . Freight forwarder (Medical):   Marland Kitchen Lack of Transportation (Non-Medical):   Physical Activity:   . Days of Exercise per Week:   . Minutes of Exercise per Session:   Stress:   . Feeling of Stress :   Social Connections:   .  Frequency of Communication with Friends and Family:   . Frequency of Social Gatherings with Friends and Family:   . Attends Religious Services:   . Active Member of Clubs or Organizations:   . Attends Archivist Meetings:   Marland Kitchen Marital Status:   Intimate Partner Violence:   . Fear of Current or Ex-Partner:   . Emotionally Abused:   Marland Kitchen Physically Abused:   . Sexually Abused:      PHYSICAL EXAM:  VS: BP 122/76   Ht 5\' 10"  (1.778 m)   Wt 245 lb (111.1 kg)   BMI 35.15 kg/m  Physical Exam Gen: NAD, alert, cooperative with exam, well-appearing MSK:  Neck: Normal range of motion in flexion extension. Normal lateral rotation. Near the midline lumbar spine and the L1-L2 region. Tender to palpation  over the trapezius near the lower cervical spine. Normal shoulder range of motion. Normal strength resistance with shrug. Back: Normal flexion and extension. Normal hip flexion. Negative straight leg raise. Neurovascularly intact     ASSESSMENT & PLAN:   Chronic neck pain Has tried physical therapy and muscle relaxers.  Appears to have loss of his cervical lordosis. -Counseled on home exercise therapy and supportive care. -MRI to evaluate for degenerative changes or nerve impingement.  Chronic bilateral low back pain with left-sided sciatica Pain is acute on chronic in nature.  Has been ongoing for several years.  He has tried physical therapy medications. -Counseled on home exercise therapy and supportive care. -Initiate gabapentin. -MRI to evaluate for possible hemangioma and nerve impingement  Joint pain Seems to have different pains through his back and knee.  Has some degenerative changes in the SI joint on imaging. -ANA, sed rate and CRP.

## 2020-04-03 NOTE — Assessment & Plan Note (Signed)
Seems to have different pains through his back and knee.  Has some degenerative changes in the SI joint on imaging. -ANA, sed rate and CRP.

## 2020-04-03 NOTE — Telephone Encounter (Signed)
Rx saxenda sent to pharmacy.

## 2020-04-03 NOTE — Patient Instructions (Signed)
Nice to meet you Please try the exercises  Please try heat  Please try the gabapentin. Start with one pill at night. It may make you drowsy. Then you can increase to 2 pills or three pills as you tolerate.  I will call with the lab results.  Please send me a message in MyChart with any questions or updates.  We will set up a virtual visit once the MRIs are resulted.   --Dr. Jordan Likes

## 2020-04-03 NOTE — Assessment & Plan Note (Signed)
Pain is acute on chronic in nature.  Has been ongoing for several years.  He has tried physical therapy medications. -Counseled on home exercise therapy and supportive care. -Initiate gabapentin. -MRI to evaluate for possible hemangioma and nerve impingement

## 2020-04-03 NOTE — Telephone Encounter (Signed)
Opened to review 

## 2020-04-05 LAB — ANA,IFA RA DIAG PNL W/RFLX TIT/PATN
ANA Titer 1: NEGATIVE
Cyclic Citrullin Peptide Ab: 4 units (ref 0–19)
Rheumatoid fact SerPl-aCnc: <10 IU/mL (ref 0.0–13.9)

## 2020-04-05 LAB — SEDIMENTATION RATE: Sed Rate: 38 mm/hr — ABNORMAL HIGH (ref 0–15)

## 2020-04-05 LAB — C-REACTIVE PROTEIN: CRP: 4 mg/L (ref 0–10)

## 2020-04-07 ENCOUNTER — Telehealth: Payer: Self-pay | Admitting: Family Medicine

## 2020-04-07 NOTE — Telephone Encounter (Signed)
Left VM for patient. If he calls back please have hinm speak with a nurse/CMA and inform that his labs were normal other than his sedimentation rate.  This is a nonspecific inflammatory marker.  It may correlate to something that we see in the MRIs so we will have to wait and see what they show..   If any questions then please take the best time and phone number to call and I will try to call him back.   Myra Rude, MD [facility] 04/07/2020, 10:24 AM

## 2020-04-09 ENCOUNTER — Ambulatory Visit: Payer: No Typology Code available for payment source | Admitting: Medical

## 2020-04-09 DIAGNOSIS — Z0289 Encounter for other administrative examinations: Secondary | ICD-10-CM

## 2020-04-14 ENCOUNTER — Ambulatory Visit (INDEPENDENT_AMBULATORY_CARE_PROVIDER_SITE_OTHER): Payer: No Typology Code available for payment source

## 2020-04-14 DIAGNOSIS — R002 Palpitations: Secondary | ICD-10-CM | POA: Diagnosis not present

## 2020-04-23 ENCOUNTER — Encounter: Payer: Self-pay | Admitting: Cardiology

## 2020-04-25 ENCOUNTER — Ambulatory Visit: Payer: No Typology Code available for payment source | Admitting: Pulmonary Disease

## 2020-04-25 ENCOUNTER — Encounter: Payer: Self-pay | Admitting: Pulmonary Disease

## 2020-04-25 ENCOUNTER — Other Ambulatory Visit: Payer: Self-pay

## 2020-04-25 VITALS — BP 128/82 | HR 115 | Temp 97.6°F | Ht 69.0 in | Wt 245.2 lb

## 2020-04-25 DIAGNOSIS — R0683 Snoring: Secondary | ICD-10-CM

## 2020-04-25 DIAGNOSIS — R0602 Shortness of breath: Secondary | ICD-10-CM

## 2020-04-25 NOTE — Progress Notes (Signed)
Brian Obrien, Critical Care, and Sleep Medicine  Chief Complaint  Patient presents with  . Consult    constant SOB for almost 1 year    Constitutional:  BP 128/82 (BP Location: Left Arm, Cuff Size: Large)   Pulse (!) 115   Temp 97.6 F (36.4 C) (Oral)   Ht 5\' 9"  (1.753 m)   Wt 245 lb 3.2 oz (111.2 kg)   SpO2 95%   BMI 36.21 kg/m   Past Medical History:  HLD, Sciatica, Neck pain, HLD  Summary:  Brian Obrien is a 34 y.o. male former smoker with dyspnea.  Subjective:   His shortness of breath has been getting progressively worse over the past year.  This can happen at rest.  He feels a fullness in his chest that is improved when he leans forward.  He feels worse after he eats a meal.  He is not having cough, wheeze, or sputum.  Has chronic allergies.  No history of pneumonia or TB.  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  Hasn't noticed any problem with muscle strength.  Not having leg swelling.  No skin rashes currently.  Has tried albuterol w/o benefit.  His father has COPD.  He quit smoking several years ago.  He was diagnosed with sleep apnea several years ago and was on CPAP.  He had trouble tolerating the mask and hasn't used in years.  He still snores, and wakes up feeling short of breath.  He does get sleepy during the day.  Chest xray from 01/03/20 was normal.  Labs from 01/03/20 Hb 15.5, BNP 3.2, Creatinine 0.75, CO2 20, LFT normal, TSH 1.48, D dimer < 0.2.  Labs from 04/03/20 ANA and RF negative.  Physical Exam:   Appearance - well kempt  ENMT - no sinus tenderness, no nasal discharge, no oral exudate, Mallampati 4, scalloped tongue  Respiratory - no wheeze, or rales  CV - regular rate and rhythm, no murmurs  GI - soft, non tender  Lymph - no adenopathy noted in neck  Ext - no edema  Skin - no rashes  Neuro - normal strength, oriented x 3  Psych - normal mood and affect  Discussion:  He has persistent shortness of breath with exertion and some at rest.  It is not  clear to me what the cause of this is.  His lab testing, chest xray and echocardiogram have been unrevealing.  He is obese and could have a component of deconditioning.  I did not appreciate any muscle weakness that would indicate a neuromuscular cause of his dyspnea.  He does have snoring, sleep disruption, apnea, and daytime sleepiness.  He has prior history of sleep apnea, but had trouble tolerating CPAP mask.  I am concerned he still has sleep apnea.  Assessment/Plan:   Dyspnea on exertion. - will get Obrien function testing and CT chest with IV contrast - further interventions based on these test results  Snoring with history of obstructive sleep apnea. - will arrange for home sleep study  A total of 47 minutes addressing patient care on the day of the visit.  Follow up:  Patient Instructions  Will schedule Obrien function test, CT chest with IV contrast, and home sleep study  Follow up in 6 weeks   Signature:  04/05/20, MD [facility] Obrien/Critical Care Pager: [phone] 04/25/2020, 5:23 PM  Flow Sheet     Obrien tests:    Sleep tests:    Cardiac tests:  Echo 03/26/20 >> EF 60 to 65%  Medications:   Allergies as of 04/25/2020   No Known Allergies     Medication List       Accurate as of April 25, 2020  5:23 PM. If you have any questions, ask your nurse or doctor.        albuterol 108 (90 Base) MCG/ACT inhaler Commonly known as: VENTOLIN HFA Inhale 2 puffs into the lungs every 6 (six) hours as needed.   azelastine 0.1 % nasal spray Commonly known as: ASTELIN Place 2 sprays into both nostrils 2 (two) times daily. Use in each nostril as directed   Cholecalciferol 125 MCG (5000 UT) capsule Take by mouth.   cyclobenzaprine 10 MG tablet Commonly known as: FLEXERIL Take 10 mg by mouth at bedtime.   Fish Oil 1000 MG Caps Take by mouth.   gabapentin 300 MG capsule Commonly known as: NEURONTIN Take 1 capsule (300 mg total) by mouth 3  (three) times daily.   loratadine 10 MG tablet Commonly known as: CLARITIN Take 10 mg by mouth daily.   multivitamin tablet Take 1 tablet by mouth every other day.   Saxenda 18 MG/3ML Sopn Generic drug: Liraglutide -Weight Management Inject 0.5 mLs (3 mg total) into the skin daily.       Past Surgical History:  He denies prior surgeries.  Family History:  His family history includes COPD in his father.  Social History:  He  reports that he quit smoking about 8 years ago. His smoking use included cigarettes. He smoked 0.25 packs per day. He quit smokeless tobacco use about 4 years ago.  His smokeless tobacco use included chew. He reports previous alcohol use. He reports that he does not use drugs.

## 2020-04-25 NOTE — Patient Instructions (Signed)
Will schedule pulmonary function test, CT chest with IV contrast, and home sleep study  Follow up in 6 weeks

## 2020-04-29 ENCOUNTER — Telehealth: Payer: Self-pay | Admitting: Family Medicine

## 2020-04-29 NOTE — Telephone Encounter (Signed)
Spoke to patient and gave result information as provided by the physician. 

## 2020-04-29 NOTE — Telephone Encounter (Signed)
Patient returning call for lab results. 

## 2020-05-05 ENCOUNTER — Ambulatory Visit
Admission: RE | Admit: 2020-05-05 | Discharge: 2020-05-05 | Disposition: A | Discharge status: Home / Self Care | Payer: No Typology Code available for payment source | Source: Ambulatory Visit | Attending: Family Medicine | Admitting: Family Medicine

## 2020-05-05 ENCOUNTER — Other Ambulatory Visit: Payer: Self-pay

## 2020-05-05 DIAGNOSIS — M5442 Lumbago with sciatica, left side: Secondary | ICD-10-CM

## 2020-05-05 DIAGNOSIS — M542 Cervicalgia: Secondary | ICD-10-CM

## 2020-05-09 ENCOUNTER — Ambulatory Visit (HOSPITAL_BASED_OUTPATIENT_CLINIC_OR_DEPARTMENT_OTHER)
Admission: RE | Admit: 2020-05-09 | Discharge: 2020-05-09 | Disposition: A | Discharge status: Home / Self Care | Payer: No Typology Code available for payment source | Source: Ambulatory Visit | Attending: Pulmonary Disease | Admitting: Pulmonary Disease

## 2020-05-09 ENCOUNTER — Ambulatory Visit (HOSPITAL_BASED_OUTPATIENT_CLINIC_OR_DEPARTMENT_OTHER): Payer: No Typology Code available for payment source

## 2020-05-09 ENCOUNTER — Telehealth (INDEPENDENT_AMBULATORY_CARE_PROVIDER_SITE_OTHER): Payer: No Typology Code available for payment source | Admitting: Family Medicine

## 2020-05-09 ENCOUNTER — Encounter (HOSPITAL_BASED_OUTPATIENT_CLINIC_OR_DEPARTMENT_OTHER): Payer: Self-pay

## 2020-05-09 ENCOUNTER — Other Ambulatory Visit: Payer: Self-pay

## 2020-05-09 DIAGNOSIS — G8929 Other chronic pain: Secondary | ICD-10-CM | POA: Diagnosis not present

## 2020-05-09 DIAGNOSIS — M5442 Lumbago with sciatica, left side: Secondary | ICD-10-CM | POA: Diagnosis not present

## 2020-05-09 DIAGNOSIS — M542 Cervicalgia: Secondary | ICD-10-CM

## 2020-05-09 DIAGNOSIS — R0602 Shortness of breath: Secondary | ICD-10-CM | POA: Diagnosis not present

## 2020-05-09 DIAGNOSIS — M25572 Pain in left ankle and joints of left foot: Secondary | ICD-10-CM | POA: Insufficient documentation

## 2020-05-09 MED ORDER — IOHEXOL 300 MG/ML  SOLN
75.0000 mL | Freq: Once | INTRAMUSCULAR | Status: AC | PRN
  Administered 2020-05-09: 75 mL via INTRAVENOUS

## 2020-05-09 NOTE — Assessment & Plan Note (Signed)
He has gotten improvement of his lower back pain with the gabapentin. -Counseled on home exercise therapy and supportive care. -Referral to physical therapy.

## 2020-05-09 NOTE — Assessment & Plan Note (Addendum)
Pain has gotten worse as of late.  He is having pain over each sternocleidomastoid.  Seems almost torticollis in nature.  MRI was demonstrating a small protrusion of the disc at C6/7.  Unclear if this is the source of his pain.  Has not gotten any improvement with the gabapentin -Counseled on home exercise therapy and supportive care. -Referral to physical therapy. -Could consider epidural injection if no improvement.

## 2020-05-09 NOTE — Progress Notes (Signed)
Virtual Visit via Video Note  I connected with Brian Obrien on 05/09/20 at  8:10 AM EDT by a video enabled telemedicine application and verified that I am speaking with the correct person using two identifiers.   I discussed the limitations of evaluation and management by telemedicine and the availability of in person appointments. The patient expressed understanding and agreed to proceed.  History of Present Illness:  Brian Obrien is a 34 year old male that is following up for his neck pain and low back pain.  MRI of his cervical spine was showing a shallow disc protrusion at C6/7.  He seems to have worsening of his neck pain.  It is posterior nature as well as over the sternocleidomastoid muscles bilaterally.  He has not had any improvement with the gabapentin.  The MRI of his lumbar spine loss of disc height at T12-L1 and mild changes of the disc at L5-S1 but no suggestion of compression or foraminal narrowing.  He did get improvement of the low back pain with the gabapentin.  He is also having some toe pain recently.  His sedimentation rate was elevated but other labs were normal.   Observations/Objective:  Gen: NAD, alert, cooperative with exam, well-appearing  Assessment and Plan:  Pain of the joint of the left foot or ankle. He recently has started having pain in his toes.  He did have a recent elevation of the sedimentation rate.  Potential for gout to be related. -Uric acid.  Chronic bilateral low back pain with left-sided sciatica: He has gotten improvement of his lower back pain with the gabapentin. -Counseled on home exercise therapy and supportive care. -Referral to physical therapy.  Chronic neck pain Pain has gotten worse as of late.  He is having pain over each sternocleidomastoid.  Seems almost torticollis in nature.  MRI was demonstrating a small protrusion of the disc at C6/7.  Unclear if this is the source of his pain.  Has not gotten any improvement with the  gabapentin -Counseled on home exercise therapy and supportive care. -Referral to physical therapy. -Could consider epidural injection if no improvement.  Follow Up Instructions:    I discussed the assessment and treatment plan with the patient. The patient was provided an opportunity to ask questions and all were answered. The patient agreed with the plan and demonstrated an understanding of the instructions.   The patient was advised to call back or seek an in-person evaluation if the symptoms worsen or if the condition fails to improve as anticipated.   Clare Gandy, MD

## 2020-05-09 NOTE — Assessment & Plan Note (Signed)
He recently has started having pain in his toes.  He did have a recent elevation of the sedimentation rate.  Potential for gout to be related. -Uric acid.

## 2020-05-10 LAB — URIC ACID: Uric Acid: 5.6 mg/dL (ref 3.8–8.4)

## 2020-05-12 ENCOUNTER — Telehealth: Payer: Self-pay | Admitting: Family Medicine

## 2020-05-12 NOTE — Telephone Encounter (Signed)
Left VM for patient. If he calls back please have him speak with a nurse/CMA and inform that his uric acid is normal.   If any questions then please take the best time and phone number to call and I will try to call her back.   Myra Rude, MD [facility] 05/12/2020, 8:35 AM

## 2020-05-12 NOTE — Telephone Encounter (Signed)
Patient returning call for results 

## 2020-05-12 NOTE — Telephone Encounter (Signed)
Spoke to patient and gave result information as provided by physician. 

## 2020-05-21 ENCOUNTER — Telehealth: Payer: Self-pay | Admitting: Pulmonary Disease

## 2020-05-21 ENCOUNTER — Other Ambulatory Visit: Payer: Self-pay

## 2020-05-21 ENCOUNTER — Encounter: Payer: Self-pay | Admitting: Physical Therapy

## 2020-05-21 ENCOUNTER — Ambulatory Visit: Payer: No Typology Code available for payment source | Attending: Family Medicine | Admitting: Physical Therapy

## 2020-05-21 DIAGNOSIS — G8929 Other chronic pain: Secondary | ICD-10-CM | POA: Diagnosis present

## 2020-05-21 DIAGNOSIS — M542 Cervicalgia: Secondary | ICD-10-CM | POA: Diagnosis not present

## 2020-05-21 DIAGNOSIS — M5442 Lumbago with sciatica, left side: Secondary | ICD-10-CM | POA: Insufficient documentation

## 2020-05-21 DIAGNOSIS — R293 Abnormal posture: Secondary | ICD-10-CM | POA: Diagnosis present

## 2020-05-21 DIAGNOSIS — R29898 Other symptoms and signs involving the musculoskeletal system: Secondary | ICD-10-CM | POA: Insufficient documentation

## 2020-05-21 NOTE — Telephone Encounter (Signed)
CT chest 05/09/20 >> no acute or chronic findings.   Please let him know his CT chest was normal.

## 2020-05-21 NOTE — Telephone Encounter (Signed)
Called and left message for patient to return call.  

## 2020-05-21 NOTE — Therapy (Signed)
[facility] [address]  [address] [place], [place], [address] Phone: [phone]   Fax:  [phone]  Physical Therapy Evaluation  Patient Details  Name: Brian Obrien MRN: [id number] Date of Birth: 01-02-1986 Referring Provider (PT): Clare Gandy, MD   Encounter Date: 05/21/2020   PT End of Session - 05/21/20 1637    Visit Number 1    Number of Visits 7    Date for PT Re-Evaluation 07/02/20    Authorization Type [facility]    PT Start Time 1358    PT Stop Time 1438    PT Time Calculation (min) 40 min    Activity Tolerance Patient tolerated treatment well    Behavior During Therapy [facility] for tasks assessed/performed           Past Medical History:  Diagnosis Date  . Allergy   . 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  . Chronic neck pain 11/12/2019  . Chronic pain of left knee 11/12/2019  . Dependence on nicotine from chewing tobacco 12/15/2017  . Hyperlipidemia   . Left sided sciatica 03/15/2018  . Lytic bone lesions on xray 02/07/2018   Formatting of this note might be different from the original. Of right iliac crest.  Seen on x-ray in 1-19 and CT on 02/05/2018. Unchanged xray in 03/2018. Repeat in 6 months (CT vs Xray)  . Metabolic syndrome 12/25/2019  . Obesity (BMI 30-39.9) 12/15/2017  . OSA (obstructive sleep apnea) 12/15/2017   2015; unable to tolerate CPAP  . Severe obesity (BMI 35.0-39.9) with comorbidity (HCC) 11/12/2019    History reviewed. No pertinent surgical history.  There were no vitals filed for this visit.    Subjective Assessment - 05/21/20 1359    Subjective Patient reports LBP started in 2012 after lifting a box, but has gotten worse in the last year. Pain starts in the central midback and radiates to the LB and to the L buttock. Denies N/T, radiation, or B&B changes. Worse in the AM, better as the day goes on. Neck pain has gotten worse in the past 7-8 months.  Worse  with head to rotation and feels stiff in the AM. Notices some pain in B lateral neck when swallowing but denies dysphagia. Notes that MD is aware and thyroid labs were normal. Denies radiation or N/T down the UEs.    Pertinent History metabolic syndrome, L sided sciatica, HLD, chronic L knee pain, chronic neck pain    Limitations Sitting;Reading;Lifting;Standing;Walking;House hold activities    Diagnostic tests 05/05/20 cervical MRI: C6-7: Shallow chronic appearing disc herniation with slight caudal down turning in the midline behind C7. At C2-3 and C3-4, there are probably small central disc bulges; 05/05/20 lumbar MRI: T12-L1: Mild chronic disc degeneration with slight loss of disc height. L5-S1: Mild desiccation of the disc but without evidence of bulge or herniation.    Patient Stated Goals "get rid of pain"    Currently in Pain? Yes    Pain Score 1     Pain Location Neck    Pain Orientation Left;Posterior;Right    Pain Descriptors / Indicators Sharp    Pain Type Chronic pain    Multiple Pain Sites Yes    Pain Score 0    Pain Location Back    Pain Orientation Mid;Lower    Pain Descriptors / Indicators Dull    Pain Type Chronic pain    Pain Radiating Towards L  buttock  [facility] PT Assessment - 05/21/20 1405      Assessment   Medical Diagnosis Chronic B LBP with L sided sciatica, Chronic neck pain    Referring Provider (PT) Clare Gandy, MD    Onset Date/Surgical Date --   chronic since 2012   Hand Dominance Right    Prior Therapy yes      Precautions   Precautions None      Balance Screen   Has the patient fallen in the past 6 months No    Has the patient had a decrease in activity level because of a fear of falling?  No    Is the patient reluctant to leave their home because of a fear of falling?  No      Home Environment   Living Environment Private residence    Living Arrangements Other relatives   sister   Available Help at Discharge Friend(s);Family     Type of Home House    Home Access Stairs to enter    Entrance Stairs-Number of Steps 6    Entrance Stairs-Rails Right;Left    Home Layout Two level    Alternate Level Stairs-Number of Steps 9    Alternate Level Stairs-Rails Right      Prior Function   Level of Independence Independent    Vocation Full time employment    Vocation Requirements MRI tech- transferring patients, sitting    Leisure none      Cognition   Overall Cognitive Status Within Functional Limits for tasks assessed      Sensation   Light Touch Appears Intact      Coordination   Gross Motor Movements are Fluid and Coordinated Yes      Posture/Postural Control   Posture/Postural Control Postural limitations    Postural Limitations Rounded Shoulders;Forward head      ROM / Strength   AROM / PROM / Strength AROM;Strength      AROM   AROM Assessment Site Cervical;Lumbar    Cervical Flexion 33    Cervical Extension 57   midrange discomfort   Cervical - Right Side Bend 41    Cervical - Left Side Bend 40    Cervical - Right Rotation 53   mild pain on L   Cervical - Left Rotation 60   mild pain on R   Lumbar Flexion mid shin    Lumbar Extension WFL    Lumbar - Right Side Bend distal thigh    Lumbar - Left Side Bend distal thigh    Lumbar - Right Rotation mildly limited    Lumbar - Left Rotation mildly limited      Strength   Strength Assessment Site Shoulder;Hip;Knee;Ankle    Right/Left Shoulder Right;Left    Right Shoulder Flexion 4+/5    Right Shoulder ABduction 4+/5    Right Shoulder Internal Rotation 4+/5    Right Shoulder External Rotation 4+/5    Left Shoulder Flexion 4+/5    Left Shoulder ABduction 4+/5    Left Shoulder Internal Rotation 4+/5   nonpainful pop in shoulder   Left Shoulder External Rotation 4+/5    Right/Left Hip Right;Left    Right Hip Flexion 4+/5    Right Hip ABduction 4+/5    Right Hip ADduction 4+/5    Left Hip ABduction 4+/5    Left Hip ADduction 4+/5    Right/Left Knee  Right;Left    Right Knee Flexion 4+/5    Right Knee Extension 4+/5    Left Knee Flexion 4/5  Left Knee Extension 4+/5    Right/Left Ankle Right;Left    Right Ankle Dorsiflexion 5/5    Right Ankle Plantar Flexion 4+/5    Left Ankle Dorsiflexion 5/5    Left Ankle Plantar Flexion 4+/5      Flexibility   Soft Tissue Assessment /Muscle Length yes    Hamstrings B severely tight, L>R    Quadriceps L mildly tight, R WFL    Piriformis B moderately tight in fig 4 and piriformis      Palpation   Palpation comment TTP over B scalenes and R proximal biceps tendon; increased soft tissue restriction throughout the neck and shoulders; TTP over midline of T12-L2, increased soft tissue restriction in B QLs      Ambulation/Gait   Assistive device None    Gait Pattern Within Functional Limits    Ambulation Surface Level;Indoor    Gait velocity WNL                      Objective measurements completed on examination: See above findings.               PT Education - 05/21/20 1636    Education Details prognosis, POC, HEP- Access Code: [email or web address] .    Person(s) Educated Patient    Methods Explanation;Demonstration;Tactile cues;Verbal cues;Handout    Comprehension Verbalized understanding;Returned demonstration            PT Short Term Goals - 05/21/20 1644      PT SHORT TERM GOAL #1   Title Patient to be independent with initial HEP.    Time 3    Period Weeks    Status New    Target Date 06/11/20             PT Long Term Goals - 05/21/20 1644      PT LONG TERM GOAL #1   Title Patient to be independent with advanced HEP.    Time 6    Period Weeks    Status New    Target Date 07/02/20      PT LONG TERM GOAL #2   Title Patient to demonstrate cervical AROM WNL and without pain limiting.    Time 6    Period Weeks    Status New    Target Date 07/02/20      PT LONG TERM GOAL #3   Title Patient to demonstrate lumbar AROM WNL and without pain limiting.     Time 6    Period Weeks    Status New    Target Date 07/02/20      PT LONG TERM GOAL #4   Title Patient to demonstrate self-correction of posture at rest and activity to improve postural awareness.    Time 6    Period Weeks    Status New    Target Date 07/02/20      PT LONG TERM GOAL #5   Title Patient to report 75% improvement in pain in the AM.    Time 6    Period Weeks    Status New    Target Date 07/02/20                  Plan - 05/21/20 1639    Clinical Impression Statement Patient is a 34y/o M presenting to OPPT with c/o chronic neck and LBP with recent worsening in the past year. LBP begins in the central midback and radiates to the LB and to the L buttock. Worse in  the AM, better as the day goes on and does not associate it with specific movements. Denies N/T, radiation, or B&B changes. Neck pain occurs over B lateral and posterior aspects of the neck. Worse in AM and when rotating the head or swallowing. Notes that MD is aware and thyroid labs were normal. Denies dysphagia, radiation, or N/T down the UEs. Patient today presenting with considerably rounded shoulders and slight forward head posture, limited cervical and lumbar ROM, L HS weakness, considerable tightness in B LEs, L>R, TTP over B scalenes and R proximal biceps tendon, and TTP over midline of T12-L2. Patient was educated on postural correction and mobility HEP- patient reported understanding. Would benefit from skilled PT services 1x/week for 6 weeks to address aforementioned impairments.    Personal Factors and Comorbidities Age;Sex;Comorbidity 3+;Fitness;Past/Current Experience;Profession;Time since onset of injury/illness/exacerbation    Comorbidities metabolic syndrome, L sided sciatica, HLD, chronic L knee pain, chronic neck pain    Examination-Activity Limitations Sit;Carry;Stand;Lift;Locomotion Level    Examination-Participation Restrictions Church;Cleaning;Shop;Community Activity;Driving;Yard  Work;Laundry;Meal Prep    Stability/Clinical Decision Making Stable/Uncomplicated    Clinical Decision Making Low    Rehab Potential Good    PT Frequency 1x / week    PT Duration 6 weeks    PT Treatment/Interventions ADLs/Self Care Home Management;Cryotherapy;Electrical Stimulation;Moist Heat;Traction;Therapeutic exercise;Therapeutic activities;Functional mobility training;Stair training;Gait training;Ultrasound;Neuromuscular re-education;Patient/family education;Manual techniques;Taping;Energy conservation;Dry needling;Passive range of motion    PT Next Visit Plan reassess HEP; assess jt mobility, postural correction exercises    Consulted and Agree with Plan of Care Patient           Patient will benefit from skilled therapeutic intervention in order to improve the following deficits and impairments:  Hypomobility, Decreased activity tolerance, Decreased strength, Pain, Increased fascial restricitons, Increased muscle spasms, Decreased range of motion, Postural dysfunction, Impaired flexibility  Visit Diagnosis: Cervicalgia  Chronic midline low back pain with left-sided sciatica  Abnormal posture  Other symptoms and signs involving the musculoskeletal system     Problem List Patient Active Problem List   Diagnosis Date Noted  . Pain of joint of left ankle and foot 05/09/2020  . Joint pain 04/03/2020  . Metabolic syndrome 12/25/2019  . 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  . Chronic neck pain 11/12/2019  . Chronic pain of left knee 11/12/2019  . Severe obesity (BMI 35.0-39.9) with comorbidity (HCC) 11/12/2019  . Left sided sciatica 03/15/2018  . Lytic bone lesions on xray 02/07/2018  . Dependence on nicotine from chewing tobacco 12/15/2017  . Obesity (BMI 30-39.9) 12/15/2017  . OSA (obstructive sleep apnea) 12/15/2017     Anette Guarneri, PT, DPT 05/21/20 4:47 PM   [facility] [address]  [address] [place], [place], [address] Phone: [phone]   Fax:  [phone]  Name: Jovante Hammitt MRN: [id number] Date of Birth: 1986/02/18

## 2020-05-22 ENCOUNTER — Ambulatory Visit: Payer: No Typology Code available for payment source | Admitting: Cardiology

## 2020-05-27 ENCOUNTER — Telehealth: Payer: Self-pay

## 2020-05-27 NOTE — Telephone Encounter (Signed)
Spoke with patient regarding results and recommendation.  Patient verbalizes understanding and is agreeable to plan of care. Advised patient to call back with any issues or concerns.  

## 2020-05-27 NOTE — Telephone Encounter (Signed)
-----   Message from Thomasene Ripple, DO sent at 05/23/2020 11:31 PM EDT ----- Normal study.

## 2020-06-02 ENCOUNTER — Other Ambulatory Visit: Payer: Self-pay

## 2020-06-02 ENCOUNTER — Ambulatory Visit: Payer: No Typology Code available for payment source | Attending: Family Medicine

## 2020-06-02 DIAGNOSIS — M542 Cervicalgia: Secondary | ICD-10-CM | POA: Insufficient documentation

## 2020-06-02 DIAGNOSIS — R29898 Other symptoms and signs involving the musculoskeletal system: Secondary | ICD-10-CM | POA: Insufficient documentation

## 2020-06-02 DIAGNOSIS — M5442 Lumbago with sciatica, left side: Secondary | ICD-10-CM | POA: Insufficient documentation

## 2020-06-02 DIAGNOSIS — G8929 Other chronic pain: Secondary | ICD-10-CM | POA: Diagnosis present

## 2020-06-02 DIAGNOSIS — R293 Abnormal posture: Secondary | ICD-10-CM | POA: Diagnosis present

## 2020-06-02 NOTE — Therapy (Signed)
[facility] [address]  [address] [place], [place], [address] Phone: [phone]   Fax:  [phone]  Physical Therapy Treatment  Patient Details  Name: Brian Obrien MRN: [id number] Date of Birth: 1986/05/12 Referring Provider (PT): Clare Gandy, MD   Encounter Date: 06/02/2020   PT End of Session - 06/02/20 1542    Visit Number 2    Number of Visits 7    Date for PT Re-Evaluation 07/02/20    Authorization Type [facility]    PT Start Time 1536    PT Stop Time 1615    PT Time Calculation (min) 39 min    Activity Tolerance Patient tolerated treatment well    Behavior During Therapy [facility] for tasks assessed/performed           Past Medical History:  Diagnosis Date  . Allergy   . 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  . Chronic neck pain 11/12/2019  . Chronic pain of left knee 11/12/2019  . Dependence on nicotine from chewing tobacco 12/15/2017  . Hyperlipidemia   . Left sided sciatica 03/15/2018  . Lytic bone lesions on xray 02/07/2018   Formatting of this note might be different from the original. Of right iliac crest.  Seen on x-ray in 1-19 and CT on 02/05/2018. Unchanged xray in 03/2018. Repeat in 6 months (CT vs Xray)  . Metabolic syndrome 12/25/2019  . Obesity (BMI 30-39.9) 12/15/2017  . OSA (obstructive sleep apnea) 12/15/2017   2015; unable to tolerate CPAP  . Severe obesity (BMI 35.0-39.9) with comorbidity (HCC) 11/12/2019    No past surgical history on file.  There were no vitals filed for this visit.   Subjective Assessment - 06/02/20 1539    Subjective Pt. reporting his L hip pain is bothering him the most today.    Pertinent History metabolic syndrome, L sided sciatica, HLD, chronic L knee pain, chronic neck pain    Diagnostic tests 05/05/20 cervical MRI: C6-7: Shallow chronic appearing disc herniation with slight caudal down turning in the midline behind C7. At C2-3 and C3-4,  there are probably small central disc bulges; 05/05/20 lumbar MRI: T12-L1: Mild chronic disc degeneration with slight loss of disc height. L5-S1: Mild desiccation of the disc but without evidence of bulge or herniation.    Currently in Pain? Yes    Pain Score 5     Pain Location Neck    Pain Orientation Left;Posterior    Pain Descriptors / Indicators --   "stiff"   Pain Type Chronic pain    Aggravating Factors  turning head both ways    Pain Relieving Factors stretches, and home exercises, massage    Multiple Pain Sites Yes    Pain Score 4    Pain Location Back    Pain Orientation Lower;Left    Pain Descriptors / Indicators Dull    Pain Type Chronic pain    Pain Radiating Towards into L buttocks    Pain Onset More than a month ago    Pain Frequency Constant                             [facility] Adult PT Treatment/Exercise - 06/02/20 0001      Lumbar Exercises: Stretches   Passive Hamstring Stretch Right;Left;2 reps;30 seconds    Passive Hamstring Stretch Limitations supine with strap     Single Knee to Chest Stretch Right;Left;1 rep;30 seconds  Press Ups 10 reps;5 seconds    Press Ups Limitations nearly full ROM - pulling in back     Piriformis Stretch Right;Left;1 rep;30 seconds    Piriformis Stretch Limitations sitting     Figure 4 Stretch 1 rep;30 seconds    Figure 4 Stretch Limitations B supine       Lumbar Exercises: Aerobic   Nustep Lvl 4, 6 min (UE/LE)      Lumbar Exercises: Machines for Strengthening   Other Lumbar Machine Exercise low row 15# x 15        Lumbar Exercises: Standing   Row Both;10 reps;Theraband;Strengthening    Theraband Level (Row) Level 2 (Red)    Shoulder Extension Both;10 reps;Theraband;Strengthening    Theraband Level (Shoulder Extension) Level 2 (Red)    Other Standing Lumbar Exercises standing B ER with red TB leaning on doorseal x 10       Lumbar Exercises: Supine   Bridge 10 reps;3 seconds    Bridge Limitations cues for  full ROM       Lumbar Exercises: Quadruped   Madcat/Old Horse 10 reps    Madcat/Old Horse Limitations Min cueing required     Straight Leg Raise 10 reps;3 seconds              Patient Education: HEP update; glute and rhomboids self-ball release on wall  Patient verbalized and demonstrated understanding and issued handout       PT Short Term Goals - 06/02/20 1542      PT SHORT TERM GOAL #1   Title Patient to be independent with initial HEP.    Time 3    Period Weeks    Status Achieved     Target Date 06/11/20             PT Long Term Goals - 06/02/20 1542      PT LONG TERM GOAL #1   Title Patient to be independent with advanced HEP.    Time 6    Period Weeks    Status On-going      PT LONG TERM GOAL #2   Title Patient to demonstrate cervical AROM WNL and without pain limiting.    Time 6    Period Weeks    Status On-going      PT LONG TERM GOAL #3   Title Patient to demonstrate lumbar AROM WNL and without pain limiting.    Time 6    Period Weeks    Status On-going      PT LONG TERM GOAL #4   Title Patient to demonstrate self-correction of posture at rest and activity to improve postural awareness.    Time 6    Period Weeks    Status On-going      PT LONG TERM GOAL #5   Title Patient to report 75% improvement in pain in the AM.    Time 6    Period Weeks    Status On-going                 Plan - 06/02/20 1543    Clinical Impression Statement Patient reporting L hip/lower back pain is primary concern today thus session focused on HEP review to address lower back and scapular strengthening.  Pt. tolerated all activities in session well today without increased pain.  Does verbalized good tolerance and adherence to HEP.  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  Tolerated all posterior chain  strengthening activities today well.  Ended visit with modalities deferred as pt. pain  free.     Comorbidities metabolic syndrome, L sided sciatica, HLD, chronic L knee pain, chronic neck pain    Rehab Potential Good    PT Frequency 1x / week    PT Treatment/Interventions ADLs/Self Care Home Management;Cryotherapy;Electrical Stimulation;Moist Heat;Traction;Therapeutic exercise;Therapeutic activities;Functional mobility training;Stair training;Gait training;Ultrasound;Neuromuscular re-education;Patient/family education;Manual techniques;Taping;Energy conservation;Dry needling;Passive range of motion    PT Next Visit Plan Postural correction exercises    Consulted and Agree with Plan of Care Patient           Patient will benefit from skilled therapeutic intervention in order to improve the following deficits and impairments:  Hypomobility, Decreased activity tolerance, Decreased strength, Pain, Increased fascial restricitons, Increased muscle spasms, Decreased range of motion, Postural dysfunction, Impaired flexibility  Visit Diagnosis: Cervicalgia  Chronic midline low back pain with left-sided sciatica  Abnormal posture  Other symptoms and signs involving the musculoskeletal system     Problem List Patient Active Problem List   Diagnosis Date Noted  . Pain of joint of left ankle and foot 05/09/2020  . Joint pain 04/03/2020  . Metabolic syndrome 12/25/2019  . 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  . Chronic neck pain 11/12/2019  . Chronic pain of left knee 11/12/2019  . Severe obesity (BMI 35.0-39.9) with comorbidity (HCC) 11/12/2019  . Left sided sciatica 03/15/2018  . Lytic bone lesions on xray 02/07/2018  . Dependence on nicotine from chewing tobacco 12/15/2017  . Obesity (BMI 30-39.9) 12/15/2017  . OSA (obstructive sleep apnea) 12/15/2017    Kermit Balo, PTA 06/02/20 6:23 PM   [facility] [address]  [address] [place], [place], [address] Phone: [phone]   Fax:   [phone]  Name: Gerald Kuehl MRN: [id number] Date of Birth: [birth year]

## 2020-06-09 ENCOUNTER — Ambulatory Visit: Payer: No Typology Code available for payment source

## 2020-06-09 ENCOUNTER — Other Ambulatory Visit: Payer: Self-pay

## 2020-06-09 DIAGNOSIS — G4733 Obstructive sleep apnea (adult) (pediatric): Secondary | ICD-10-CM

## 2020-06-09 DIAGNOSIS — M542 Cervicalgia: Secondary | ICD-10-CM

## 2020-06-09 DIAGNOSIS — G8929 Other chronic pain: Secondary | ICD-10-CM

## 2020-06-09 DIAGNOSIS — R29898 Other symptoms and signs involving the musculoskeletal system: Secondary | ICD-10-CM

## 2020-06-09 DIAGNOSIS — R0683 Snoring: Secondary | ICD-10-CM

## 2020-06-09 DIAGNOSIS — R293 Abnormal posture: Secondary | ICD-10-CM

## 2020-06-09 NOTE — Therapy (Signed)
[facility] [address]  [address] [place], [place], [address] Phone: [phone]   Fax:  [phone]  Physical Therapy Treatment  Patient Details  Name: Brian Obrien MRN: [id number] Date of Birth: 1986/02/28 Referring Provider (PT): Clare Gandy, MD   Encounter Date: 06/09/2020   PT End of Session - 06/09/20 1623    Visit Number 3    Number of Visits 7    Date for PT Re-Evaluation 07/02/20    Authorization Type [facility]    PT Start Time 1619    PT Stop Time 1657    PT Time Calculation (min) 38 min    Activity Tolerance Patient tolerated treatment well    Behavior During Therapy [facility] for tasks assessed/performed           Past Medical History:  Diagnosis Date  . Allergy   . 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  . Chronic neck pain 11/12/2019  . Chronic pain of left knee 11/12/2019  . Dependence on nicotine from chewing tobacco 12/15/2017  . Hyperlipidemia   . Left sided sciatica 03/15/2018  . Lytic bone lesions on xray 02/07/2018   Formatting of this note might be different from the original. Of right iliac crest.  Seen on x-ray in 1-19 and CT on 02/05/2018. Unchanged xray in 03/2018. Repeat in 6 months (CT vs Xray)  . Metabolic syndrome 12/25/2019  . Obesity (BMI 30-39.9) 12/15/2017  . OSA (obstructive sleep apnea) 12/15/2017   2015; unable to tolerate CPAP  . Severe obesity (BMI 35.0-39.9) with comorbidity (HCC) 11/12/2019    No past surgical history on file.  There were no vitals filed for this visit.   Subjective Assessment - 06/09/20 1627    Subjective Pt. noting continued LBP at work.    Pertinent History metabolic syndrome, L sided sciatica, HLD, chronic L knee pain, chronic neck pain    Diagnostic tests 05/05/20 cervical MRI: C6-7: Shallow chronic appearing disc herniation with slight caudal down turning in the midline behind C7. At C2-3 and C3-4, there are probably small central  disc bulges; 05/05/20 lumbar MRI: T12-L1: Mild chronic disc degeneration with slight loss of disc height. L5-S1: Mild desiccation of the disc but without evidence of bulge or herniation.    Patient Stated Goals "get rid of pain"    Currently in Pain? No/denies    Pain Score 0-No pain    Pain Location Neck    Pain Orientation Left;Posterior    Pain Descriptors / Indicators Dull    Pain Type Chronic pain    Aggravating Factors  turning head both ways    Multiple Pain Sites Yes    Pain Score 0   back pain up to a 4/10   Pain Location Back    Pain Orientation Lower;Left    Pain Descriptors / Indicators Dull    Pain Type Chronic pain    Pain Radiating Towards into L buttocks    Pain Onset More than a month ago                             [facility] Adult PT Treatment/Exercise - 06/09/20 0001      Lumbar Exercises: Stretches   Lower Trunk Rotation Limitations 5" x 10 reps     Quadruped Mid Back Stretch 3 reps;20 seconds    Quadruped Mid Back Stretch Limitations 3-way prone childs pse stretch     Piriformis  Stretch Right;Left;1 rep;30 seconds    Piriformis Stretch Limitations sitting     Figure 4 Stretch 1 rep;30 seconds    Figure 4 Stretch Limitations B supine       Lumbar Exercises: Aerobic   Nustep Lvl 4, 7 min (UE/LE)      Lumbar Exercises: Standing   Functional Squats 10 reps;3 seconds    Functional Squats Limitations TRX      Lumbar Exercises: Supine   Clam 10 reps;3 seconds    Clam Limitations Alternating clam shell green     Bridge 10 reps;5 seconds    Bridge Limitations cues for full ROM     Bridge with clamshell 10 reps;5 seconds    Bridge with [employer] Limitations + B hip isometrics into green TB at knees       Lumbar Exercises: Quadruped   Opposite Arm/Leg Raise 5 reps;Right arm/Left leg;Left arm/Right leg;3 seconds    Opposite Arm/Leg Raise Limitations cues for positioning       Neck Exercises: Stretches   Levator Stretch Right;Left;30  seconds;2 reps    Levator Stretch Limitations seated                   PT Education - 06/09/20 1703    Education Details HEP update; levator scap. stretch, bridge with green TB at knees    Person(s) Educated Patient    Methods Explanation;Demonstration;Verbal cues;Handout    Comprehension Returned demonstration;Verbalized understanding;Verbal cues required            PT Short Term Goals - 06/02/20 1542      PT SHORT TERM GOAL #1   Title Patient to be independent with initial HEP.    Time 3    Period Weeks    Status Achieved    Target Date 06/11/20             PT Long Term Goals - 06/02/20 1542      PT LONG TERM GOAL #1   Title Patient to be independent with advanced HEP.    Time 6    Period Weeks    Status On-going      PT LONG TERM GOAL #2   Title Patient to demonstrate cervical AROM WNL and without pain limiting.    Time 6    Period Weeks    Status On-going      PT LONG TERM GOAL #3   Title Patient to demonstrate lumbar AROM WNL and without pain limiting.    Time 6    Period Weeks    Status On-going      PT LONG TERM GOAL #4   Title Patient to demonstrate self-correction of posture at rest and activity to improve postural awareness.    Time 6    Period Weeks    Status On-going      PT LONG TERM GOAL #5   Title Patient to report 75% improvement in pain in the AM.    Time 6    Period Weeks    Status On-going                 Plan - 06/09/20 1623    Clinical Impression Statement Pt. 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  Noted HEP stretches have provided him some relief.  Does feel that his pain levels are improving with therapy.  Has not yet attempted tennis ball self-glute massage on wall.  Encouraged  pt. to use tennis ball for self-glute massage at home as pt. with complaint of L muscular buttocks pain.  Progressed hip strengthening with HEP updated accordingly as pt.  tolerated all activities in session well.  Pt. pain free to end session thus modalities deferred.    Comorbidities metabolic syndrome, L sided sciatica, HLD, chronic L knee pain, chronic neck pain    Rehab Potential Good    PT Frequency 1x / week    PT Treatment/Interventions ADLs/Self Care Home Management;Cryotherapy;Electrical Stimulation;Moist Heat;Traction;Therapeutic exercise;Therapeutic activities;Functional mobility training;Stair training;Gait training;Ultrasound;Neuromuscular re-education;Patient/family education;Manual techniques;Taping;Energy conservation;Dry needling;Passive range of motion    PT Next Visit Plan Postural correction exercises    Consulted and Agree with Plan of Care Patient           Patient will benefit from skilled therapeutic intervention in order to improve the following deficits and impairments:  Hypomobility, Decreased activity tolerance, Decreased strength, Pain, Increased fascial restricitons, Increased muscle spasms, Decreased range of motion, Postural dysfunction, Impaired flexibility  Visit Diagnosis: Cervicalgia  Chronic midline low back pain with left-sided sciatica  Abnormal posture  Other symptoms and signs involving the musculoskeletal system     Problem List Patient Active Problem List   Diagnosis Date Noted  . Pain of joint of left ankle and foot 05/09/2020  . Joint pain 04/03/2020  . Metabolic syndrome 12/25/2019  . 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  . Chronic neck pain 11/12/2019  . Chronic pain of left knee 11/12/2019  . Severe obesity (BMI 35.0-39.9) with comorbidity (HCC) 11/12/2019  . Left sided sciatica 03/15/2018  . Lytic bone lesions on xray 02/07/2018  . Dependence on nicotine from chewing tobacco 12/15/2017  . Obesity (BMI 30-39.9) 12/15/2017  . OSA (obstructive sleep apnea) 12/15/2017    Kermit Balo, PTA 06/09/20 5:51 PM   [facility] [address]  [address] [place], [place], [address] Phone: [phone]   Fax:  [phone]  Name: Brian Obrien MRN: [id number] Date of Birth: 08/13/1986

## 2020-06-10 ENCOUNTER — Ambulatory Visit (INDEPENDENT_AMBULATORY_CARE_PROVIDER_SITE_OTHER): Payer: No Typology Code available for payment source | Admitting: Pulmonary Disease

## 2020-06-10 ENCOUNTER — Other Ambulatory Visit: Payer: Self-pay

## 2020-06-10 DIAGNOSIS — R0602 Shortness of breath: Secondary | ICD-10-CM | POA: Diagnosis not present

## 2020-06-10 LAB — PULMONARY FUNCTION TEST
DL/VA % pred: 102 %
DL/VA: 4.71 ml/min/mmHg/L
DLCO cor % pred: 103 %
DLCO cor: 31.4 ml/min/mmHg
DLCO unc % pred: 103 %
DLCO unc: 31.4 ml/min/mmHg
FEF 25-75 Post: 3.44 L/sec
FEF 25-75 Pre: 3.16 L/sec
FEF2575-%Change-Post: 8 %
FEF2575-%Pred-Post: 84 %
FEF2575-%Pred-Pre: 77 %
FEV1-%Change-Post: 2 %
FEV1-%Pred-Post: 96 %
FEV1-%Pred-Pre: 93 %
FEV1-Post: 3.79 L
FEV1-Pre: 3.7 L
FEV1FVC-%Change-Post: 0 %
FEV1FVC-%Pred-Pre: 92 %
FEV6-%Change-Post: 1 %
FEV6-Post: 4.86 L
FEV6-Pre: 4.77 L
FVC-%Change-Post: 1 %
FVC-%Pred-Post: 102 %
FVC-%Pred-Pre: 100 %
FVC-Post: 4.86 L
FVC-Pre: 4.77 L
Post FEV1/FVC ratio: 78 %
Post FEV6/FVC ratio: 100 %
Pre FEV1/FVC ratio: 77 %
Pre FEV6/FVC Ratio: 100 %
RV % pred: 116 %
RV: 1.85 L
TLC % pred: 108 %
TLC: 7.13 L

## 2020-06-10 NOTE — Progress Notes (Signed)
Full PFT performed today. °

## 2020-06-11 ENCOUNTER — Encounter (INDEPENDENT_AMBULATORY_CARE_PROVIDER_SITE_OTHER): Payer: No Typology Code available for payment source | Admitting: Bariatrics

## 2020-06-11 ENCOUNTER — Telehealth: Payer: Self-pay | Admitting: Pulmonary Disease

## 2020-06-11 ENCOUNTER — Encounter (INDEPENDENT_AMBULATORY_CARE_PROVIDER_SITE_OTHER): Payer: Self-pay | Admitting: Bariatrics

## 2020-06-11 ENCOUNTER — Ambulatory Visit: Payer: No Typology Code available for payment source | Admitting: Cardiology

## 2020-06-11 ENCOUNTER — Encounter (INDEPENDENT_AMBULATORY_CARE_PROVIDER_SITE_OTHER): Payer: Self-pay

## 2020-06-11 DIAGNOSIS — G4733 Obstructive sleep apnea (adult) (pediatric): Secondary | ICD-10-CM | POA: Diagnosis not present

## 2020-06-11 NOTE — Telephone Encounter (Signed)
HST 06/10/20 >> AHI 6.1, SpO2 low 82%   Please inform him that his sleep study shows mild obstructive sleep apnea.  Please arrange for ROV with me or NP to discuss treatment options.  Please also tell him his PFT was normal.

## 2020-06-11 NOTE — Telephone Encounter (Signed)
Patient aware of results of : HST/PFT. Patient already had f/u scheduled with [facility] in Sept. Offered earlier with NP patient declined Nothing further needed.

## 2020-06-12 NOTE — Progress Notes (Signed)
This encounter was created in error - please disregard.  This encounter was created in error - please disregard.  This encounter was created in error - please disregard.  This encounter was created in error - please disregard.  This encounter was created in error - please disregard.  This encounter was created in error - please disregard.  This encounter was created in error - please disregard.  This encounter was created in error - please disregard.

## 2020-06-13 ENCOUNTER — Ambulatory Visit: Payer: No Typology Code available for payment source | Admitting: Pulmonary Disease

## 2020-06-13 ENCOUNTER — Ambulatory Visit: Payer: No Typology Code available for payment source | Admitting: Cardiology

## 2020-06-16 ENCOUNTER — Other Ambulatory Visit: Payer: Self-pay

## 2020-06-16 ENCOUNTER — Ambulatory Visit: Payer: No Typology Code available for payment source | Admitting: Physical Therapy

## 2020-06-16 ENCOUNTER — Encounter: Payer: Self-pay | Admitting: Physical Therapy

## 2020-06-16 DIAGNOSIS — R293 Abnormal posture: Secondary | ICD-10-CM

## 2020-06-16 DIAGNOSIS — M5442 Lumbago with sciatica, left side: Secondary | ICD-10-CM

## 2020-06-16 DIAGNOSIS — M542 Cervicalgia: Secondary | ICD-10-CM | POA: Diagnosis not present

## 2020-06-16 DIAGNOSIS — R29898 Other symptoms and signs involving the musculoskeletal system: Secondary | ICD-10-CM

## 2020-06-16 NOTE — Therapy (Signed)
[facility] [address]  [address] [place], [place], [address] Phone: [phone]   Fax:  [phone]  Physical Therapy Treatment  Patient Details  Name: Brian Obrien MRN: [id number] Date of Birth: Feb 12, 1986 Referring Provider (PT): Clare Gandy, MD   Encounter Date: 06/16/2020   PT End of Session - 06/16/20 1659    Visit Number 4    Number of Visits 7    Date for PT Re-Evaluation 07/02/20    Authorization Type [facility]    PT Start Time 1620    PT Stop Time 1658    PT Time Calculation (min) 38 min    Activity Tolerance Patient tolerated treatment well    Behavior During Therapy [facility] for tasks assessed/performed           Past Medical History:  Diagnosis Date  . Allergy   . 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  . Chronic neck pain 11/12/2019  . Chronic pain of left knee 11/12/2019  . Dependence on nicotine from chewing tobacco 12/15/2017  . Hyperlipidemia   . Left sided sciatica 03/15/2018  . Lytic bone lesions on xray 02/07/2018   Formatting of this note might be different from the original. Of right iliac crest.  Seen on x-ray in 1-19 and CT on 02/05/2018. Unchanged xray in 03/2018. Repeat in 6 months (CT vs Xray)  . Metabolic syndrome 12/25/2019  . Obesity (BMI 30-39.9) 12/15/2017  . OSA (obstructive sleep apnea) 12/15/2017   2015; unable to tolerate CPAP  . Severe obesity (BMI 35.0-39.9) with comorbidity (HCC) 11/12/2019    History reviewed. No pertinent surgical history.  There were no vitals filed for this visit.   Subjective Assessment - 06/16/20 1621    Subjective Has been feeling better. Denies questions on HEP.    Pertinent History metabolic syndrome, L sided sciatica, HLD, chronic L knee pain, chronic neck pain    Diagnostic tests 05/05/20 cervical MRI: C6-7: Shallow chronic appearing disc herniation with slight caudal down turning in the midline behind C7. At C2-3 and C3-4,  there are probably small central disc bulges; 05/05/20 lumbar MRI: T12-L1: Mild chronic disc degeneration with slight loss of disc height. L5-S1: Mild desiccation of the disc but without evidence of bulge or herniation.    Patient Stated Goals "get rid of pain"    Currently in Pain? No/denies                             [facility] Adult PT Treatment/Exercise - 06/16/20 0001      Exercises   Exercises Neck      Neck Exercises: Seated   Cervical Rotation Right;Left;10 reps    Cervical Rotation Limitations SNAG to tolerance    Other Seated Exercise cervical extension SNAG to tolerance x10      Lumbar Exercises: Aerobic   UBE (Upper Arm Bike) L1.7 x forward/3 min back      Manual Therapy   Manual Therapy Joint mobilization;Soft tissue mobilization;Myofascial release;Manual Traction;Passive ROM    Manual therapy comments supine    Joint Mobilization cervical central PAs grade III/IV, side glides grade III to tolerance    Myofascial Release B suboccipital release    Passive ROM R and L cervical rotation and sidebending PROM to tolerance 30" each    Manual Traction to tolerance with towel assist 3x30"  PT Education - 06/16/20 1659    Education Details update to HEP; edu on [name] home traction unit    Person(s) Educated Patient    Methods Explanation;Demonstration;Tactile cues;Verbal cues;Handout    Comprehension Verbalized understanding;Returned demonstration            PT Short Term Goals - 06/02/20 1542      PT SHORT TERM GOAL #1   Title Patient to be independent with initial HEP.    Time 3    Period Weeks    Status Achieved    Target Date 06/11/20             PT Long Term Goals - 06/02/20 1542      PT LONG TERM GOAL #1   Title Patient to be independent with advanced HEP.    Time 6    Period Weeks    Status On-going      PT LONG TERM GOAL #2   Title Patient to demonstrate cervical AROM WNL and without pain limiting.     Time 6    Period Weeks    Status On-going      PT LONG TERM GOAL #3   Title Patient to demonstrate lumbar AROM WNL and without pain limiting.    Time 6    Period Weeks    Status On-going      PT LONG TERM GOAL #4   Title Patient to demonstrate self-correction of posture at rest and activity to improve postural awareness.    Time 6    Period Weeks    Status On-going      PT LONG TERM GOAL #5   Title Patient to report 75% improvement in pain in the AM.    Time 6    Period Weeks    Status On-going                 Plan - 06/16/20 1700    Clinical Impression Statement Patient reporting improvement in pain levels and notes compliance with HEP. Denies questions on any of his exercises. Began session working on manual traction, suboccipital release, and joint mobilizations for improvement in cervical ROM. Patient reported improvement in cervical tension after MT, particularly noting relief with suboccipital release and manual traction. Educated patient on use of [employer] home traction unit and use of tennis ball release for self-STM to the suboccipitals for similar relief at home. Followed MT with cervical SNAGs for improvement in ROM. Updated these exercises into HEP as they were well-tolerated today. Patient reported understanding and without complaints at end of session.    Comorbidities metabolic syndrome, L sided sciatica, HLD, chronic L knee pain, chronic neck pain    Rehab Potential Good    PT Frequency 1x / week    PT Treatment/Interventions ADLs/Self Care Home Management;Cryotherapy;Electrical Stimulation;Moist Heat;Traction;Therapeutic exercise;Therapeutic activities;Functional mobility training;Stair training;Gait training;Ultrasound;Neuromuscular re-education;Patient/family education;Manual techniques;Taping;Energy conservation;Dry needling;Passive range of motion    PT Next Visit Plan trial mechanical cervical traction to assess potential benefit of [employer] unit;   Postural correction exercises    Consulted and Agree with Plan of Care Patient           Patient will benefit from skilled therapeutic intervention in order to improve the following deficits and impairments:  Hypomobility, Decreased activity tolerance, Decreased strength, Pain, Increased fascial restricitons, Increased muscle spasms, Decreased range of motion, Postural dysfunction, Impaired flexibility  Visit Diagnosis: Cervicalgia  Chronic midline low back pain with left-sided sciatica  Abnormal posture  Other symptoms and signs involving  the musculoskeletal system     Problem List Patient Active Problem List   Diagnosis Date Noted  . Pain of joint of left ankle and foot 05/09/2020  . Joint pain 04/03/2020  . Metabolic syndrome 12/25/2019  . 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  . Chronic neck pain 11/12/2019  . Chronic pain of left knee 11/12/2019  . Severe obesity (BMI 35.0-39.9) with comorbidity (HCC) 11/12/2019  . Left sided sciatica 03/15/2018  . Lytic bone lesions on xray 02/07/2018  . Dependence on nicotine from chewing tobacco 12/15/2017  . Obesity (BMI 30-39.9) 12/15/2017  . OSA (obstructive sleep apnea) 12/15/2017     Anette Guarneri, PT, DPT 06/16/20 6:03 PM   [facility] [address]  [address] [place], [place], [address] Phone: [phone]   Fax:  [phone]  Name: Brian Obrien MRN: [id number] Date of Birth: 05/07/1986

## 2020-06-23 ENCOUNTER — Other Ambulatory Visit: Payer: Self-pay

## 2020-06-23 ENCOUNTER — Ambulatory Visit: Payer: No Typology Code available for payment source | Attending: Family Medicine

## 2020-06-23 DIAGNOSIS — M542 Cervicalgia: Secondary | ICD-10-CM | POA: Insufficient documentation

## 2020-06-23 DIAGNOSIS — G8929 Other chronic pain: Secondary | ICD-10-CM | POA: Diagnosis present

## 2020-06-23 DIAGNOSIS — M5442 Lumbago with sciatica, left side: Secondary | ICD-10-CM | POA: Insufficient documentation

## 2020-06-23 DIAGNOSIS — R29898 Other symptoms and signs involving the musculoskeletal system: Secondary | ICD-10-CM | POA: Insufficient documentation

## 2020-06-23 DIAGNOSIS — R293 Abnormal posture: Secondary | ICD-10-CM | POA: Diagnosis present

## 2020-06-23 NOTE — Therapy (Addendum)
[facility] [address]  [address] [place], [place], [address] Phone: [phone]   Fax:  [phone]  Physical Therapy Treatment  Patient Details  Name: Brian Obrien MRN: [id number] Date of Birth: [birth year] Referring Provider (PT): Clare Gandy, MD   Encounter Date: 06/23/2020   PT End of Session - 06/23/20 1648    Visit Number 5    Number of Visits 7    Date for PT Re-Evaluation 07/02/20    Authorization Type [facility]    PT Start Time 1625    PT Stop Time 1715    PT Time Calculation (min) 50 min    Activity Tolerance Patient tolerated treatment well    Behavior During Therapy [facility] for tasks assessed/performed           Past Medical History:  Diagnosis Date   Allergy    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   Chronic neck pain 11/12/2019   Chronic pain of left knee 11/12/2019   Dependence on nicotine from chewing tobacco 12/15/2017   Hyperlipidemia    Left sided sciatica 03/15/2018   Lytic bone lesions on xray 02/07/2018   Formatting of this note might be different from the original. Of right iliac crest.  Seen on x-ray in 1-19 and CT on 02/05/2018. Unchanged xray in 03/2018. Repeat in 6 months (CT vs Xray)   Metabolic syndrome 12/25/2019   Obesity (BMI 30-39.9) 12/15/2017   OSA (obstructive sleep apnea) 12/15/2017   2015; unable to tolerate CPAP   Severe obesity (BMI 35.0-39.9) with comorbidity (HCC) 11/12/2019    No past surgical history on file.  There were no vitals filed for this visit.   Subjective Assessment - 06/23/20 1637    Subjective Pt. noting neck pain is his main conern today.    Pertinent History metabolic syndrome, L sided sciatica, HLD, chronic L knee pain, chronic neck pain    Diagnostic tests 05/05/20 cervical MRI: C6-7: Shallow chronic appearing disc herniation with slight caudal down turning in the midline behind C7. At C2-3 and C3-4, there are probably  small central disc bulges; 05/05/20 lumbar MRI: T12-L1: Mild chronic disc degeneration with slight loss of disc height. L5-S1: Mild desiccation of the disc but without evidence of bulge or herniation.    Patient Stated Goals "get rid of pain"    Currently in Pain? Yes    Pain Score 4     Pain Location Neck    Pain Orientation Left;Posterior    Pain Descriptors / Indicators Dull    Pain Type Chronic pain    Pain Frequency Intermittent    Aggravating Factors  turning head, looking down, mornings                     Modalities: Cervical traction: Neutral, hooklying, 20dg cervical flexion pull; 20#/25#, 10 min           [facility] Adult PT Treatment/Exercise - 06/23/20 0001      Self-Care   Self-Care Other Self-Care Comments    Other Self-Care Comments  discussion of mechanical cervical traction along with benefit from home cervical traction units; patients questions answered       Lumbar Exercises: Aerobic   Nustep Lvl 4, 7 min (UE/LE)      Manual Therapy   Manual Therapy Soft tissue mobilization;Myofascial release    Manual therapy comments sitting     Soft tissue mobilization STM to B UT, LS, cervical paraspinals, rhomboids  Myofascial Release TPR to L LS      Neck Exercises: Stretches   Upper Trapezius Stretch Right;Left;2 reps;30 seconds    Upper Trapezius Stretch Limitations seated     Levator Stretch Right;Left;30 seconds;2 reps    Levator Stretch Limitations seated                     PT Short Term Goals - 06/02/20 1542      PT SHORT TERM GOAL #1   Title Patient to be independent with initial HEP.    Time 3    Period Weeks    Status Achieved    Target Date 06/11/20             PT Long Term Goals - 06/02/20 1542      PT LONG TERM GOAL #1   Title Patient to be independent with advanced HEP.    Time 6    Period Weeks    Status On-going      PT LONG TERM GOAL #2   Title Patient to demonstrate cervical AROM WNL and without pain  limiting.    Time 6    Period Weeks    Status On-going      PT LONG TERM GOAL #3   Title Patient to demonstrate lumbar AROM WNL and without pain limiting.    Time 6    Period Weeks    Status On-going      PT LONG TERM GOAL #4   Title Patient to demonstrate self-correction of posture at rest and activity to improve postural awareness.    Time 6    Period Weeks    Status On-going      PT LONG TERM GOAL #5   Title Patient to report 75% improvement in pain in the AM.    Time 6    Period Weeks    Status On-going                 Plan - 06/23/20 1649    Clinical Impression Statement Patient with primary complaint of L-sided neck pain to start session which was reproduced with cervical flexion and R side bending.  performed gentle cervical ROM and shoulder/cervical stretching along with instruction in benefits from cervical mechanical traction due to positive response to manual traction last session.  Trialed cervical mechanical traction at 20dg flexion pull 25#/20# today with good response.  Pt. reporting he may be interested in purchase of [employer] home cervical traction unit however wishes to wait till next session to decide.  pt. requesting to return to therapy at 1x/2 wk frequency.    Comorbidities metabolic syndrome, L sided sciatica, HLD, chronic L knee pain, chronic neck pain    Rehab Potential Good    PT Frequency 1x / week    PT Treatment/Interventions ADLs/Self Care Home Management;Cryotherapy;Electrical Stimulation;Moist Heat;Traction;Therapeutic exercise;Therapeutic activities;Functional mobility training;Stair training;Gait training;Ultrasound;Neuromuscular re-education;Patient/family education;Manual techniques;Taping;Energy conservation;Dry needling;Passive range of motion    PT Next Visit Plan Monitor response of mechanical cervical traction to assess potential benefit of Saunder's unit;  Postural correction exercises    Consulted and Agree with Plan of Care Patient            Patient will benefit from skilled therapeutic intervention in order to improve the following deficits and impairments:  Hypomobility, Decreased activity tolerance, Decreased strength, Pain, Increased fascial restricitons, Increased muscle spasms, Decreased range of motion, Postural dysfunction, Impaired flexibility  Visit Diagnosis: Cervicalgia  Chronic midline low back pain with left-sided sciatica  Abnormal posture  Other symptoms and signs involving the musculoskeletal system     Problem List Patient Active Problem List   Diagnosis Date Noted   Pain of joint of left ankle and foot 05/09/2020   Joint pain 04/03/2020   Metabolic syndrome 12/25/2019   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   Chronic neck pain 11/12/2019   Chronic pain of left knee 11/12/2019   Severe obesity (BMI 35.0-39.9) with comorbidity (HCC) 11/12/2019   Left sided sciatica 03/15/2018   Lytic bone lesions on xray 02/07/2018   Dependence on nicotine from chewing tobacco 12/15/2017   Obesity (BMI 30-39.9) 12/15/2017   OSA (obstructive sleep apnea) 12/15/2017    Kermit Balo, PTA 06/23/20 5:38 PM   [facility] [address]  [address] [place], [place], [address] Phone: [phone]   Fax:  [phone]  Name: Brian Obrien MRN: [id number] Date of Birth: 16-Mar-1986

## 2020-06-25 ENCOUNTER — Ambulatory Visit (INDEPENDENT_AMBULATORY_CARE_PROVIDER_SITE_OTHER): Payer: No Typology Code available for payment source | Admitting: Bariatrics

## 2020-06-25 ENCOUNTER — Other Ambulatory Visit: Payer: Self-pay

## 2020-06-25 ENCOUNTER — Encounter (INDEPENDENT_AMBULATORY_CARE_PROVIDER_SITE_OTHER): Payer: Self-pay | Admitting: Bariatrics

## 2020-06-25 VITALS — BP 120/80 | HR 67 | Temp 98.1°F | Ht 69.0 in | Wt 247.0 lb

## 2020-06-25 DIAGNOSIS — F3289 Other specified depressive episodes: Secondary | ICD-10-CM

## 2020-06-25 DIAGNOSIS — R0602 Shortness of breath: Secondary | ICD-10-CM | POA: Diagnosis not present

## 2020-06-25 DIAGNOSIS — Z6836 Body mass index (BMI) 36.0-36.9, adult: Secondary | ICD-10-CM

## 2020-06-25 DIAGNOSIS — E559 Vitamin D deficiency, unspecified: Secondary | ICD-10-CM | POA: Diagnosis not present

## 2020-06-25 DIAGNOSIS — E8881 Metabolic syndrome: Secondary | ICD-10-CM

## 2020-06-25 DIAGNOSIS — M5442 Lumbago with sciatica, left side: Secondary | ICD-10-CM | POA: Diagnosis not present

## 2020-06-25 DIAGNOSIS — R5383 Other fatigue: Secondary | ICD-10-CM

## 2020-06-25 DIAGNOSIS — G8929 Other chronic pain: Secondary | ICD-10-CM

## 2020-06-25 DIAGNOSIS — Z0289 Encounter for other administrative examinations: Secondary | ICD-10-CM

## 2020-06-25 DIAGNOSIS — G4733 Obstructive sleep apnea (adult) (pediatric): Secondary | ICD-10-CM | POA: Diagnosis not present

## 2020-06-25 DIAGNOSIS — Z9189 Other specified personal risk factors, not elsewhere classified: Secondary | ICD-10-CM | POA: Diagnosis not present

## 2020-06-26 ENCOUNTER — Encounter (INDEPENDENT_AMBULATORY_CARE_PROVIDER_SITE_OTHER): Payer: Self-pay | Admitting: Bariatrics

## 2020-06-26 LAB — VITAMIN D 25 HYDROXY (VIT D DEFICIENCY, FRACTURES): Vit D, 25-Hydroxy: 31.1 ng/mL (ref 30.0–100.0)

## 2020-06-26 LAB — HEMOGLOBIN A1C
Est. average glucose Bld gHb Est-mCnc: 108 mg/dL
Hgb A1c MFr Bld: 5.4 % (ref 4.8–5.6)

## 2020-06-26 LAB — INSULIN, RANDOM: INSULIN: 29.3 u[IU]/mL — ABNORMAL HIGH (ref 2.6–24.9)

## 2020-06-26 LAB — VITAMIN B12: Vitamin B-12: 459 pg/mL (ref 232–1245)

## 2020-06-26 NOTE — Progress Notes (Signed)
Dear Brian Richters, PA-C,   Thank you for referring Brian Obrien to our clinic. The following note includes my evaluation and treatment recommendations.  Chief Complaint:   OBESITY Brian Obrien (MR# [id number]) is a 34 y.o. male who presents for evaluation and treatment of obesity and related comorbidities. Current BMI is Body mass index is 36.48 kg/m.Marland Kitchen Brian Obrien has been struggling with his weight for many years and has been unsuccessful in either losing weight, maintaining weight loss, or reaching his healthy weight goal.  Brian Obrien is currently in the action stage of change and ready to dedicate time achieving and maintaining a healthier weight. Brian Obrien is interested in becoming our patient and working on intensive lifestyle modifications including (but not limited to) diet and exercise for weight loss.  Brian Obrien likes to cook sometimes and states that he craves sweets and fried food. He had been on Saxenda in the past, but is not currently.  Brian Obrien's habits were reviewed today and are as follows: His family eats meals together, his desired weight loss is 42-52 lbs, he has been heavy most of his life, he started gaining weight at the age of 62, he craves fried food and sweets, he snacks frequently in the evenings, he frequently makes poor food choices, he has problems with excessive hunger, he frequently eats larger portions than normal, he has binge eating behaviors and he struggles with emotional eating.  Depression Screen Brian Obrien's Food and Mood (modified PHQ-9) score was 14.  Depression screen PHQ 2/9 06/25/2020  Decreased Interest 2  Down, Depressed, Hopeless 3  PHQ - 2 Score 5  Altered sleeping 1  Tired, decreased energy 3  Change in appetite 2  Feeling bad or failure about yourself  1  Trouble concentrating 1  Moving slowly or fidgety/restless 1  Suicidal thoughts 0  PHQ-9 Score 14  Difficult doing work/chores Not difficult at all   Subjective:   Other fatigue.  Brian Obrien denies daytime somnolence and admits to waking up still tired. Patent has a history of symptoms of morning headache. Brian Obrien generally gets 6 hours of sleep per night, and states that he has generally restful sleep. Snoring is present. Apneic episodes are not present. Epworth Sleepiness Score is 5.  Shortness of breath on exertion. Brian Obrien notes increasing shortness of breath with certain activities and seems to be worsening over time with weight gain. He notes getting out of breath sooner with activity than he used to. This has gotten worse recently. Brian Obrien denies shortness of breath at rest or orthopnea.  OSA (obstructive sleep apnea). Brian Obrien is not using CPAP. He is considering a dental appliance.  Chronic bilateral low back pain with left-sided sciatica. Brian Obrien reports intermittent pain with certain activities. He is seeing PT.  Metabolic syndrome. Brian Obrien reports a normal appetite.  Vitamin D deficiency. Brian Obrien is taking Vitamin D 5,000 IU.  Other depression, with emotional eating. Brian Obrien is struggling with emotional eating and using food for comfort to the extent that it is negatively impacting his health. He has been working on behavior modification techniques to help reduce his emotional eating and has been somewhat successful. He shows no sign of suicidal or homicidal ideations. Brian Obrien endorses stress and emotional eating.  At risk for activity intolerance. Brian Obrien is at risk of exercise intolerance due to back pain.  Assessment/Plan:   Other fatigue. Brian Obrien does feel that his weight is causing his energy to be lower than it should be. Fatigue may be related to obesity, depression or many other causes.  Labs will be ordered, and in the meanwhile, Brian Obrien will focus on self care including making healthy food choices, increasing physical activity and focusing on stress reduction. EKG 12-Lead performed today.  Shortness of breath on exertion. Brian Obrien does feel that he gets out  of breath more easily that he used to when he exercises. Brian Obrien's shortness of breath appears to be obesity related and exercise induced. He has agreed to work on weight loss and gradually increase exercise to treat his exercise induced shortness of breath. Will continue to monitor closely.  OSA (obstructive sleep apnea). Intensive lifestyle modifications are the first line treatment for this issue. We discussed several lifestyle modifications today and he will continue to work on diet, exercise and weight loss efforts. We will continue to monitor. Orders and follow up as documented in patient record. Brian Obrien will check on dental appliance for sleep apnea. He has an appointment with a pulmonologist.  Counseling  Sleep apnea is a condition in which breathing pauses or becomes shallow during sleep. This happens over and over during the night. This disrupts your sleep and keeps your body from getting the rest that it needs, which can cause tiredness and lack of energy (fatigue) during the day.  Sleep apnea treatment: If you were given a device to open your airway while you sleep, USE IT!  Sleep hygiene:   Limit or avoid alcohol, caffeinated beverages, and cigarettes, especially close to bedtime.   Do not eat a large meal or eat spicy foods right before bedtime. This can lead to digestive discomfort that can make it hard for you to sleep.  Keep a sleep diary to help you and your health care provider figure out what could be causing your insomnia.  . Make your bedroom a dark, comfortable place where it is easy to fall asleep. ? Put up shades or blackout curtains to block light from outside. ? Use a white noise machine to block noise. ? Keep the temperature cool. . Limit screen use before bedtime. This includes: ? Watching TV. ? Using your smartphone, tablet, or computer. . Stick to a routine that includes going to bed and waking up at the same times every day and night. This can help you fall  asleep faster. Consider making a quiet activity, such as reading, part of your nighttime routine. . Try to avoid taking naps during the day so that you sleep better at night. . Get out of bed if you are still awake after 15 minutes of trying to sleep. Keep the lights down, but try reading or doing a quiet activity. When you feel sleepy, go back to bed.  Chronic bilateral low back pain with left-sided sciatica. [place] will continue to see PT as scheduled and as directed.   Metabolic syndrome. Hemoglobin A1c, Insulin, random levels will be checked today.  Vitamin D deficiency. Low Vitamin D level contributes to fatigue and are associated with obesity, breast, and colon cancer. He agrees to continue to take Vitamin D @ 5,000 IU as directed and VITAMIN D 25 Hydroxy (Vit-D Deficiency, Fractures) level will be checked today.  Other depression, with emotional eating. Behavior modification techniques were discussed today to help Traeger deal with his emotional/non-hunger eating behaviors.  Orders and follow up as documented in patient record. He will be referred to Dr. Dewaine Conger, our bariatric psychologist, for evaluation.  At risk for activity intolerance. Yusuke was given approximately 15 minutes of exercise intolerance counseling today. He is 34 y.o. male and has risk factors  exercise intolerance including obesity. We discussed intensive lifestyle modifications today with an emphasis on specific weight loss instructions and strategies. Manual will slowly increase activity as tolerated.  Repetitive spaced learning was employed today to elicit superior memory formation and behavioral change.  Class 2 severe obesity with serious comorbidity and body mass index (BMI) of 36.0 to 36.9 in adult, unspecified obesity type (HCC).  Corran is currently in the action stage of change and his goal is to continue with weight loss efforts. I recommend Rachid begin the structured treatment plan as follows:  He has  agreed to the Category 3 Plan.  He will work on meal planning and will stop all sugary drinks.  We reviewed with the patient labs from 03/19/2020 including CMP, lipids, glucose, and TSH.  Exercise goals: All adults should avoid inactivity. Some physical activity is better than none, and adults who participate in any amount of physical activity gain some health benefits.   Behavioral modification strategies: increasing lean protein intake, decreasing simple carbohydrates, increasing vegetables, increasing water intake, decreasing eating out, no skipping meals, meal planning and cooking strategies, keeping healthy foods in the home and planning for success.  He was informed of the importance of frequent follow-up visits to maximize his success with intensive lifestyle modifications for his multiple health conditions. He was informed we would discuss his lab results at his next visit unless there is a critical issue that needs to be addressed sooner. Dickey agreed to keep his next visit at the agreed upon time to discuss these results.  Objective:   Blood pressure 120/80, pulse 67, temperature 98.1 F (36.7 C), height 5\' 9"  (1.753 m), weight 247 lb (112 kg), SpO2 98 %. Body mass index is 36.48 kg/m.  EKG: Normal sinus rhythm, rate 74. Within normal limits.  Indirect Calorimeter completed today shows a VO2 of 276 and a REE of 1919.  His calculated basal metabolic rate is thus his basal metabolic rate is worse than expected.  General: Cooperative, alert, well developed, in no acute distress. HEENT: Conjunctivae and lids unremarkable. Cardiovascular: Regular rhythm.  Lungs: Normal work of breathing. Neurologic: No focal deficits.   Lab Results  Component Value Date   CREATININE 0.88 03/19/2020   BUN 14 03/19/2020   NA 142 03/19/2020   K 4.1 03/19/2020   CL 100 03/19/2020   CO2 26 03/19/2020   No results found for: ALT, AST, GGT, ALKPHOS, BILITOT Lab Results  Component Value  Date   HGBA1C 5.4 06/25/2020   Lab Results  Component Value Date   INSULIN 29.3 (H) 06/25/2020   Lab Results  Component Value Date   TSH 3.220 03/19/2020   Lab Results  Component Value Date   CHOL 161 03/19/2020   HDL 35 (L) 03/19/2020   LDLCALC 96 03/19/2020   TRIG 175 (H) 03/19/2020   CHOLHDL 4.6 03/19/2020   No results found for: WBC, HGB, HCT, MCV, PLT No results found for: IRON, TIBC, FERRITIN  Attestation Statements:   Reviewed by clinician on day of visit: allergies, medications, problem list, medical history, surgical history, family history, social history, and previous encounter notes.  03/21/2020, am acting as Fernanda Drum for Energy manager, DO   I have reviewed the above documentation for accuracy and completeness, and I agree with the above. [employer], DO
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
## 2020-07-07 ENCOUNTER — Ambulatory Visit: Payer: No Typology Code available for payment source

## 2020-07-07 ENCOUNTER — Encounter (INDEPENDENT_AMBULATORY_CARE_PROVIDER_SITE_OTHER): Payer: Self-pay | Admitting: Bariatrics

## 2020-07-07 DIAGNOSIS — E8881 Metabolic syndrome: Secondary | ICD-10-CM | POA: Insufficient documentation

## 2020-07-08 ENCOUNTER — Other Ambulatory Visit: Payer: Self-pay

## 2020-07-08 ENCOUNTER — Ambulatory Visit: Payer: No Typology Code available for payment source

## 2020-07-08 DIAGNOSIS — R29898 Other symptoms and signs involving the musculoskeletal system: Secondary | ICD-10-CM

## 2020-07-08 DIAGNOSIS — M542 Cervicalgia: Secondary | ICD-10-CM

## 2020-07-08 DIAGNOSIS — G8929 Other chronic pain: Secondary | ICD-10-CM

## 2020-07-08 DIAGNOSIS — M5442 Lumbago with sciatica, left side: Secondary | ICD-10-CM

## 2020-07-08 DIAGNOSIS — R293 Abnormal posture: Secondary | ICD-10-CM

## 2020-07-08 NOTE — Therapy (Signed)
[facility] [address]  [place], [place], [address] Phone: [phone]   Fax:  [phone]  Physical Therapy Treatment  Patient Details  Name: Brian Obrien MRN: [id number] Date of Birth: 18-Mar-1986 Referring Provider (PT): Clearance Coots, MD   Encounter Date: 07/08/2020   PT End of Session - 07/08/20 1708    Visit Number 6    Number of Visits 7    Date for PT Re-Evaluation 07/02/20    Authorization Type [facility]    PT Start Time 1704    PT Stop Time 1755    PT Time Calculation (min) 51 min    Activity Tolerance Patient tolerated treatment well    Behavior During Therapy [facility] for tasks assessed/performed           Past Medical History:  Diagnosis Date   Allergy    Anxiety    Chest pain    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   Chronic neck pain 11/12/2019   Chronic pain of left knee 11/12/2019   Dependence on nicotine from chewing tobacco 12/15/2017   Depression    Fatty liver    GERD (gastroesophageal reflux disease)    Hyperlipidemia    Left hip pain    Left sided sciatica 03/15/2018   Lytic bone lesions on xray 02/07/2018   Formatting of this note might be different from the original. Of right iliac crest.  Seen on x-ray in 1-19 and CT on 02/05/2018. Unchanged xray in 03/2018. Repeat in 6 months (CT vs Xray)   Metabolic syndrome 04/28/2093   Obesity (BMI 30-39.9) 12/15/2017   OSA (obstructive sleep apnea) 12/15/2017   2015; unable to tolerate CPAP   Palpitations    Severe obesity (BMI 35.0-39.9) with comorbidity ([place]) 11/12/2019   Sleep apnea    SOB (shortness of breath)    Vitamin B 12 deficiency    Vitamin D deficiency     Past Surgical History:  Procedure Laterality Date   HAIR TRANSPLANT      There were no vitals filed for this visit.   Subjective Assessment - 07/08/20 1712    Subjective Doing better.  notes 75% improvement in pain since  starting therapy.    Pertinent History metabolic syndrome, L sided sciatica, HLD, chronic L knee pain, chronic neck pain    Diagnostic tests 05/05/20 cervical MRI: C6-7: Shallow chronic appearing disc herniation with slight caudal down turning in the midline behind C7. At C2-3 and C3-4, there are probably small central disc bulges; 05/05/20 lumbar MRI: T12-L1: Mild chronic disc degeneration with slight loss of disc height. L5-S1: Mild desiccation of the disc but without evidence of bulge or herniation.    Patient Stated Goals "get rid of pain"    Currently in Pain? No/denies    Pain Score 0-No pain    Multiple Pain Sites No              [facility] PT Assessment - 07/08/20 0001      AROM   AROM Assessment Site Cervical    Cervical Flexion 42    Cervical Extension 60    Cervical - Right Side Bend 35    Cervical - Left Side Bend 34    Cervical - Right Rotation 57    Cervical - Left Rotation 75    Lumbar Flexion mid shin    Lumbar Extension WFL    Lumbar - Right Side Bend knee joint    Lumbar -  Left Side Bend knee joint    Lumbar - Right Rotation WFL    Lumbar - Left Rotation [facility]                         [facility] Adult PT Treatment/Exercise - 07/08/20 1728      Lumbar Exercises: Aerobic   Nustep Lvl 4, 7 min (UE/LE)      Lumbar Exercises: Supine   Bridge 15 reps;5 seconds    Bridge Limitations cues for proper hold time       Lumbar Exercises: Sidelying   Other Sidelying Lumbar Exercises B open book stretch x 10      Traction   Type of Traction Cervical    Min (lbs) 25    Max (lbs) 30    Hold Time 60    Rest Time 20    Time 10      Neck Exercises: Stretches   Upper Trapezius Stretch Right;Left;2 reps;30 seconds    Upper Trapezius Stretch Limitations seated     Levator Stretch Right;Left;30 seconds;2 reps    Levator Stretch Limitations seated                     PT Short Term Goals - 06/02/20 1542      PT SHORT TERM GOAL #1   Title Patient to be  independent with initial HEP.    Time 3    Period Weeks    Status Achieved    Target Date 06/11/20             PT Long Term Goals - 07/08/20 1709      PT LONG TERM GOAL #1   Title Patient to be independent with advanced HEP.    Time 6    Period Weeks    Status On-going      PT LONG TERM GOAL #2   Title Patient to demonstrate cervical AROM WNL and without pain limiting.    Time 6    Period Weeks    Status Partially Met   07/08/20: met for L rotation, cervical flexion, extension; limited with B side bending and R rotation     PT LONG TERM GOAL #3   Title Patient to demonstrate lumbar AROM WNL and without pain limiting.    Time 6    Period Weeks    Status Partially Met   07/08/20: met for all except lumbar AROM flexion - likely due to HS tightness     PT LONG TERM GOAL #4   Title Patient to demonstrate self-correction of posture at rest and activity to improve postural awareness.    Time 6    Period Weeks    Status On-going      PT LONG TERM GOAL #5   Title Patient to report 75% improvement in pain in the AM.    Time 6    Period Weeks    Status Achieved   07/08/20                Plan - 07/08/20 1710    Clinical Impression Statement Pt. reporting unsure of benefit from mechanical cervical traction last visit however would like to progress today.  Pt. reporting he has seen 75% overall improvement in his pain since starting therapy.  LTG #5 met.  Able to partially achieve cervical AROM goal demonstrating improved cervical flexion, extension, L rotation.  LTG #2 partially achieved.  Able to nearly meet LTG #3 demonstrating improved  lumbar AROM rotation with some remaining limitation with lumbar flexion ROM likely due to ongoing HS tightness.  Pt. reporting he feels he will be ready to transition to the home program after next session.    Comorbidities metabolic syndrome, L sided sciatica, HLD, chronic L knee pain, chronic neck pain    Rehab Potential Good    PT  Frequency 1x / week    PT Duration 6 weeks    PT Treatment/Interventions ADLs/Self Care Home Management;Cryotherapy;Electrical Stimulation;Moist Heat;Traction;Therapeutic exercise;Therapeutic activities;Functional mobility training;Stair training;Gait training;Ultrasound;Neuromuscular re-education;Patient/family education;Manual techniques;Taping;Energy conservation;Dry needling;Passive range of motion    PT Next Visit Plan Expect hold after next session; Monitor response of mechanical cervical traction to assess potential benefit of Saunder's unit;  Postural correction exercises    Consulted and Agree with Plan of Care Patient           Patient will benefit from skilled therapeutic intervention in order to improve the following deficits and impairments:  Hypomobility, Decreased activity tolerance, Decreased strength, Pain, Increased fascial restricitons, Increased muscle spasms, Decreased range of motion, Postural dysfunction, Impaired flexibility  Visit Diagnosis: Cervicalgia  Chronic midline low back pain with left-sided sciatica  Abnormal posture  Other symptoms and signs involving the musculoskeletal system     Problem List Patient Active Problem List   Diagnosis Date Noted   Insulin resistance 07/07/2020   Pain of joint of left ankle and foot 05/09/2020   Joint pain 35/45/6256   Metabolic syndrome 38/93/7342   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   Chronic neck pain 11/12/2019   Chronic pain of left knee 11/12/2019   Severe obesity (BMI 35.0-39.9) with comorbidity ([place]) 11/12/2019   Left sided sciatica 03/15/2018   Lytic bone lesions on xray 02/07/2018   Dependence on nicotine from chewing tobacco 12/15/2017   Obesity (BMI 30-39.9) 12/15/2017   OSA (obstructive sleep apnea) 12/15/2017    Bess Harvest, PTA 07/08/20 5:55 PM   [facility] [address]  [place], [place], [address] Phone: [phone]   Fax:  [phone]  Name: Idrissa Beville MRN: [id number] Date of Birth: 22-Aug-1986

## 2020-07-09 ENCOUNTER — Other Ambulatory Visit: Payer: Self-pay

## 2020-07-09 ENCOUNTER — Encounter (INDEPENDENT_AMBULATORY_CARE_PROVIDER_SITE_OTHER): Payer: Self-pay | Admitting: Bariatrics

## 2020-07-09 ENCOUNTER — Ambulatory Visit (INDEPENDENT_AMBULATORY_CARE_PROVIDER_SITE_OTHER): Payer: No Typology Code available for payment source | Admitting: Bariatrics

## 2020-07-09 VITALS — BP 120/80 | HR 82 | Temp 98.3°F | Ht 69.0 in | Wt 247.0 lb

## 2020-07-09 DIAGNOSIS — E8881 Metabolic syndrome: Secondary | ICD-10-CM | POA: Diagnosis not present

## 2020-07-09 DIAGNOSIS — Z9189 Other specified personal risk factors, not elsewhere classified: Secondary | ICD-10-CM

## 2020-07-09 DIAGNOSIS — Z6836 Body mass index (BMI) 36.0-36.9, adult: Secondary | ICD-10-CM

## 2020-07-09 DIAGNOSIS — E559 Vitamin D deficiency, unspecified: Secondary | ICD-10-CM

## 2020-07-09 MED ORDER — METFORMIN HCL 500 MG PO TABS: 500.0000 mg | ORAL_TABLET | Freq: Every day | ORAL | 0 refills | Status: DC

## 2020-07-09 MED ORDER — VITAMIN D3 1.25 MG (50000 UT) PO CAPS: 50000.0000 [IU] | ORAL_CAPSULE | ORAL | 0 refills | Status: DC

## 2020-07-10 NOTE — Progress Notes (Signed)
Chief Complaint:   OBESITY Brian Obrien is here to discuss his progress with his obesity treatment plan along with follow-up of his obesity related diagnoses. Brian Obrien is on the Category 3 Plan and states he is following his eating plan approximately 60% of the time. Brian Obrien states he is walking 3 miles 4-5 times per week and cycling 30 minutes 2 times per week.  Today's visit was #: 2 Starting weight: 247 lbs Starting date: 06/25/2020 Today's weight: 247 lbs Today's date: 07/09/2020 Total lbs lost to date: 0 Total lbs lost since last in-office visit: 0  Interim History: Brian Obrien's weight remains the same. He followed the plan about 60%, but he gets hungry a lot. He works 7 p.m. to 7 a.m. and is snacking more. He reports he is good with his water intake.  Subjective:   Vitamin D deficiency. [name] is on an OTC supplement.   Ref. Range 06/25/2020 13:06  Vitamin D, 25-Hydroxy Latest Ref Range: 30.0 - 100.0 ng/mL 31.1   Insulin resistance. Brian Obrien has a diagnosis of insulin resistance based on his elevated fasting insulin level >5. He continues to work on diet and exercise to decrease his risk of diabetes. Brian Obrien is on metformin and denies absolute and relative contraindications.  Lab Results  Component Value Date   INSULIN 29.3 (H) 06/25/2020   Lab Results  Component Value Date   HGBA1C 5.4 06/25/2020   At risk for diabetes mellitus. [name] is at higher than average risk for developing diabetes due to insulin resistance.  Assessment/Plan:   Vitamin D deficiency. Low Vitamin D level contributes to fatigue and are associated with obesity, breast, and colon cancer. He was given a prescription for Cholecalciferol (VITAMIN D3) 1.25 MG (50000 UT) CAPS every week #4 with 0 refills and will follow-up for routine testing of Vitamin D, at least 2-3 times per year to avoid over-replacement.   Insulin resistance. Brian Obrien will continue to work on weight loss, exercise, and decreasing  simple carbohydrates to help decrease the risk of diabetes. Brian Obrien agreed to follow-up with [place] as directed to closely monitor his progress. Prescription was given for metFORMIN (GLUCOPHAGE) 500 MG tablet 1 in the a.m. with food #30 with 0 refills.  At risk for diabetes mellitus. Brian Obrien was given approximately 15 minutes of diabetes education and counseling today. We discussed intensive lifestyle modifications today with an emphasis on weight loss as well as increasing exercise and decreasing simple carbohydrates in his diet. We also reviewed medication options with an emphasis on risk versus benefit of those discussed.   Repetitive spaced learning was employed today to elicit superior memory formation and behavioral change.  Class 2 severe obesity with serious comorbidity and body mass index (BMI) of 36.0 to 36.9 in adult, unspecified obesity type (HCC).  Brian Obrien is currently in the action stage of change. As such, his goal is to continue with weight loss efforts. He has agreed to the Category 3 Plan.   He will work on meal planning and intentional eating.   We reviewed with the patient labs from 06/25/2020 including Vitamin D, B12, A1c, and insulin.  Exercise goals: Brian Obrien will continue walking 3-4 times a week for exercise.  Behavioral modification strategies: increasing lean protein intake, decreasing simple carbohydrates, increasing vegetables, increasing water intake, decreasing eating out, no skipping meals, meal planning and cooking strategies, keeping healthy foods in the home and planning for success.  Brian Obrien has agreed to follow-up with our clinic in 2 weeks. He was informed of  the importance of frequent follow-up visits to maximize his success with intensive lifestyle modifications for his multiple health conditions.   Objective:   Blood pressure 120/80, pulse 82, temperature 98.3 F (36.8 C), height 5\' 9"  (1.753 m), weight 247 lb (112 kg), SpO2 96 %. Body mass index is 36.48  kg/m.  General: Cooperative, alert, well developed, in no acute distress. HEENT: Conjunctivae and lids unremarkable. Cardiovascular: Regular rhythm.  Lungs: Normal work of breathing. Neurologic: No focal deficits.   Lab Results  Component Value Date   CREATININE 0.88 03/19/2020   BUN 14 03/19/2020   NA 142 03/19/2020   K 4.1 03/19/2020   CL 100 03/19/2020   CO2 26 03/19/2020   No results found for: ALT, AST, GGT, ALKPHOS, BILITOT Lab Results  Component Value Date   HGBA1C 5.4 06/25/2020   Lab Results  Component Value Date   INSULIN 29.3 (H) 06/25/2020   Lab Results  Component Value Date   TSH 3.220 03/19/2020   Lab Results  Component Value Date   CHOL 161 03/19/2020   HDL 35 (L) 03/19/2020   LDLCALC 96 03/19/2020   TRIG 175 (H) 03/19/2020   CHOLHDL 4.6 03/19/2020   No results found for: WBC, HGB, HCT, MCV, PLT No results found for: IRON, TIBC, FERRITIN  Attestation Statements:   Reviewed by clinician on day of visit: allergies, medications, problem list, medical history, surgical history, family history, social history, and previous encounter notes.  03/21/2020, am acting as Fernanda Drum for [employer] manager, DO   I have reviewed the above documentation for accuracy and completeness, and I agree with the above. [employer], DO

## 2020-07-14 ENCOUNTER — Encounter (INDEPENDENT_AMBULATORY_CARE_PROVIDER_SITE_OTHER): Payer: Self-pay | Admitting: Bariatrics

## 2020-07-15 ENCOUNTER — Ambulatory Visit (INDEPENDENT_AMBULATORY_CARE_PROVIDER_SITE_OTHER): Payer: No Typology Code available for payment source | Admitting: Psychology

## 2020-07-15 ENCOUNTER — Encounter (INDEPENDENT_AMBULATORY_CARE_PROVIDER_SITE_OTHER): Payer: Self-pay
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
## 2020-07-17 ENCOUNTER — Telehealth (INDEPENDENT_AMBULATORY_CARE_PROVIDER_SITE_OTHER): Payer: Self-pay | Admitting: Psychology

## 2020-07-17 ENCOUNTER — Telehealth (INDEPENDENT_AMBULATORY_CARE_PROVIDER_SITE_OTHER): Payer: No Typology Code available for payment source | Admitting: Psychology

## 2020-07-17 NOTE — Progress Notes (Signed)
Office: [phone]  /  Fax: [phone]    Date: July 21, 2020   Appointment Start Time: 3:01pm Duration: 29 minutes Provider: Lawerance CruelGaytri Elisheva Fallas, Psy.D. Type of Session: Intake for Individual Therapy  Location of Patient: Home Location of Provider: [facility] Type of Contact: Telepsychological Visit via MyChart Video Visit  Informed Consent: Prior to proceeding with today's appointment, two pieces of identifying information were obtained. In addition, Brian Obrien's physical location at the time of this appointment was obtained as well a phone number he could be reached at in the event of technical difficulties. MVHQIONTayyiab and this provider participated in today's telepsychological service.   The provider's role was explained to [employer]ge. The provider reviewed and discussed issues of confidentiality, privacy, and limits therein (e.g., reporting obligations). In addition to verbal informed consent, written informed consent for psychological services was obtained prior to the initial appointment. Since the clinic is not a 24/7 crisis center, mental health emergency resources were shared and this  provider explained MyChart, e-mail, voicemail, and/or other messaging systems should be utilized only for non-emergency reasons. This provider also explained that information obtained during appointments will be placed in Finley's medical record and relevant information will be shared with other providers at [facility] for coordination of care. Moreover, Brian Obrien agreed information may be shared with other [facility] providers as needed for coordination of care. By signing the service agreement document, Brian Obrien provided written consent for coordination of care. Prior to initiating telepsychological services, Brian Obrien completed an informed consent document, which included the development of a safety plan (i.e., an emergency contact, nearest emergency room,  and emergency resources) in the event of an emergency/crisis. Brian Obrien expressed understanding of the rationale of the safety plan. Brian Obrien verbally acknowledged understanding he is ultimately responsible for understanding his insurance benefits for telepsychological and in-person services. This provider also reviewed confidentiality, as it relates to telepsychological services, as well as the rationale for telepsychological services (i.e., to reduce exposure risk to COVID-19). Brian Obrien  acknowledged understanding that appointments cannot be recorded without both party consent and he is aware he is responsible for securing confidentiality on his end of the session. Brian Obrien verbally consented to proceed.  Chief Complaint/HPI: Brian Obrien was referred by Dr. Corinna CapraAngel Brown due to other depression, with emotional eating. Per the note for the initial visit with Dr. Corinna CapraAngel Brown on June 25, 2020, "Brian Obrien is struggling with emotional eating and using food for comfort to the extent that it is negatively impacting his health. He has been working on behavior modification techniques to help reduce his emotional eating and has been somewhat successful. He shows no sign of suicidal or homicidal ideations.  Brian Obrien endorses stress and emotional eating." The note for the initial appointment with Dr. Corinna CapraAngel Brown indicated the following: "Brian Obrien's habits were reviewed today and are as follows: His family eats meals together, his desired weight loss is 42-52 lbs, he has been heavy most of his life, he started gaining weight at the age of 34, he craves fried food and sweets, he snacks frequently in the evenings, he frequently makes poor food choices, he has problems with excessive hunger, he frequently eats larger portions than normal, he has binge eating behaviors and he struggles with emotional eating." Brian Obrien's Food and Mood (modified PHQ-9) score on June 25, 2020 was 14.  During today's appointment, Brian Obrien was verbally  administered a questionnaire assessing various behaviors related to emotional eating. Brian Obrien endorsed the following: overeat when you are  celebrating, experience food cravings on a regular basis, eat certain foods when you are anxious, stressed, depressed, or your feelings are hurt, use food to help you cope with emotional situations, find food is comforting to you, overeat when you are worried about something, overeat frequently when you are bored or lonely, not worry about what you eat when you are in a good mood, eat to help you stay awake and eat as a reward. He shared he craves "mostly fried foods" and anything that is "easy to make." Graceson believes the onset of emotional eating was likely last year when he divorced and described the current frequency of emotional eating as "a few times a month for a couple days." In addition, Brian Obrien endorsed a history of engaging in binge eating behaviors. Brian Obrien reported sometimes he will fast for a couple days to lose weight and then eat whatever he can, noting the frequency of the aforementioned as 2-3 times a year. Brian Obrien denied a history of engagement in compensatory strategies for weight loss. He denied a history of treatment of emotional/binge eating treatment and diagnos(es). Currently, Brian Obrien indicated stress triggers emotional/binge eating, whereas giving himself time makes emotional/binge eating better. Furthermore, HQPRFFM denied other problems of concern.    Mental Status Examination:  Appearance: well groomed and appropriate hygiene  Behavior: appropriate to circumstances Mood: euthymic Affect: mood congruent Speech: normal in rate, volume, and tone Eye Contact: appropriate Psychomotor Activity: appropriate Gait: unable to assess Thought Process: linear, logical, and goal directed  Thought Content/Perception: denies suicidal and homicidal ideation, plan, and intent and no hallucinations, delusions, bizarre thinking or behavior reported or  observed Orientation: time, person, place, and purpose of appointment Memory/Concentration: memory, attention, language, and fund of knowledge intact  Insight/Judgment: good  Family & Psychosocial History: Conrad reported he divorced last year and he does not have any children. 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 He shared he also works for another company resulting in changes in schedule. Additionally, Shermar shared his highest level of education obtained is an associate's degree. Currently, Irving's social support system consists of his siblings and parents. Moreover, Locke stated he resides with his sister.   Medical History:  Past Medical History:  Diagnosis Date  . Allergy   . Anxiety   . Chest pain   . 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  . Chronic neck pain 11/12/2019  . Chronic pain of left knee 11/12/2019  . Dependence on nicotine from chewing tobacco 12/15/2017  . Depression   . Fatty liver   . GERD (gastroesophageal reflux disease)   . Hyperlipidemia   . Left hip pain   . Left sided sciatica 03/15/2018  . Lytic bone lesions on xray 02/07/2018   Formatting of this note might be different from the original. Of right iliac crest.  Seen on x-ray in 1-19 and CT on 02/05/2018. Unchanged xray in 03/2018. Repeat in 6 months (CT vs Xray)  . Metabolic syndrome 12/25/2019  . Obesity (BMI 30-39.9) 12/15/2017  . OSA (obstructive sleep apnea) 12/15/2017   2015; unable to tolerate CPAP  . Palpitations   . Severe obesity (BMI 35.0-39.9) with comorbidity (HCC) 11/12/2019  . Sleep apnea   . SOB (shortness of breath)   . Vitamin B 12 deficiency   . Vitamin D deficiency    Past Surgical History:  Procedure Laterality Date  . HAIR TRANSPLANT     Current Outpatient Medications  on File Prior to Visit  Medication Sig Dispense Refill  . albuterol (VENTOLIN HFA) 108 (90 Base) MCG/ACT inhaler Inhale  2 puffs into the lungs every 6 (six) hours as needed. 18 g 0  . azelastine (ASTELIN) 0.1 % nasal spray Place 2 sprays into both nostrils 2 (two) times daily. Use in each nostril as directed 30 mL 2  . Biotin 54650 MCG TABS Take by mouth.    . Cholecalciferol (VITAMIN D3) 1.25 MG (50000 UT) CAPS Take 50,000 Int'l Units by mouth once a week. 4 capsule 0  . cyclobenzaprine (FLEXERIL) 10 MG tablet Take 10 mg by mouth at bedtime.    . gabapentin (NEURONTIN) 300 MG capsule Take 1 capsule (300 mg total) by mouth 3 (three) times daily. 90 capsule 1  . loratadine (CLARITIN) 10 MG tablet Take 10 mg by mouth daily.    . metFORMIN (GLUCOPHAGE) 500 MG tablet Take 1 tablet (500 mg total) by mouth daily with breakfast. 30 tablet 0  . Multiple Vitamin (MULTIVITAMIN) tablet Take 1 tablet by mouth every other day.    . Omega-3 Fatty Acids (FISH OIL) 1000 MG CAPS Take by mouth.     No current facility-administered medications on file prior to visit.  Adelfo denied a history of head injuries and loss of consciousness.    Mental Health History: Alandis denied a history of therapeutic and psychiatric services. Taysen reported there is no history of hospitalizations for psychiatric concerns. Jadrian reported his maternal uncle is diagnosed with depression. Demorio reported there is no history of trauma including psychological, physical  and sexual abuse, as well as neglect.   Bairon described his typical mood lately as "okay," noting there are "days" where he is "not too happy." He explained some days he may feel down due to the recent divorce resulting in a change of productivity. He stated "it goes back to normal in a few days." Aside from concerns noted above and endorsed on the PHQ-9 and GAD-7, Holdyn reported experiencing worry thoughts about his future (e.g., feeling as though he is "not settled"). Jahiem denied a history of alcohol use. He denied current tobacco use. He denied illicit/recreational substance  use. Regarding caffeine intake, Micajah reported consuming chai and coffee daily. Furthermore, PTWSFKC indicated he is not experiencing the following: hallucinations and delusions, paranoia, symptoms of mania , social withdrawal, crying spells and panic attacks. He also denied history of and current suicidal ideation, plan, and intent; history of and current homicidal ideation, plan, and intent; and history of and current engagement in self-harm.  The following strength was reported by Shlomo: good family support. The following strengths were observed by this provider: ability to express thoughts and feelings during the therapeutic session, ability to establish and benefit from a therapeutic relationship, willingness to work toward established goal(s) with the clinic and ability to engage in reciprocal conversation.   Legal History: Keita reported there is no history of legal involvement.   Structured Assessments Results: The Patient Health Questionnaire-9 (PHQ-9) is a self-report measure that assesses symptoms and severity of depression over the course of the last two weeks. LEXNTZG obtained a score of 6 suggesting mild depression. Suhaib finds the endorsed symptoms to be not difficult at all. [0= Not at all; 1= Several days; 2= More than half the days; 3= Nearly every day] Little interest or pleasure in doing things 1  Feeling down, depressed, or hopeless 1  Trouble falling or staying asleep, or sleeping too much 1  Feeling tired or  having little energy 3  Poor appetite or overeating 0  Feeling bad about yourself --- or that you are a failure or have let yourself or your family down 0  Trouble concentrating on things, such as reading the newspaper or watching television 0  Moving or speaking so slowly that other people could have noticed? Or the opposite --- being so fidgety or restless that you have been moving around a lot more than usual 0  Thoughts that you would be better off dead or  hurting yourself in some way 0  PHQ-9 Score 6    The Generalized Anxiety Disorder-7 (GAD-7) is a brief self-report measure that assesses symptoms of anxiety over the course of the last two weeks. YTKPTWS obtained a score of 2 suggesting minimal anxiety. Reon finds the endorsed symptoms to be not difficult at all. [0= Not at all; 1= Several days; 2= Over half the days; 3= Nearly every day] Feeling nervous, anxious, on edge 0  Not being able to stop or control worrying 0  Worrying too much about different things 1  Trouble relaxing 0  Being so restless that it's hard to sit still 0  Becoming easily annoyed or irritable 1  Feeling afraid as if something awful might happen 0  GAD-7 Score 2   Interventions:  Conducted a chart review Focused on rapport building Verbally administered PHQ-9 and GAD-7 for symptom monitoring Verbally administered Food & Mood questionnaire to assess various behaviors related to emotional eating Provided emphatic reflections and validation Collaborated with patient on a treatment goal  Psychoeducation provided regarding physical versus emotional hunger  Recommended longer-term therapeutic services  Provisional DSM-5 Diagnosis(es): 311 (F32.8) Other Specified Depressive Disorder, Emotional Eating Behaviors  Plan: Stevens appears able and willing to participate as evidenced by collaboration on a treatment goal, engagement in reciprocal conversation, and asking questions as needed for clarification. Kacey was receptive to a referral for longer-term therapeutic services with [facility] to address ongoing stressors, including his recent divorce. He was also receptive to meeting with this provider to address eating concerns. As such, the next appointment will be scheduled in 2-3 weeks, which will be via MyChart Video Visit. The following treatment goal was established: increase coping skills. This provider will regularly review the treatment plan and  medical chart to keep informed of status changes. Arda expressed understanding and agreement with the initial treatment plan of care. Rondy will be sent a handout via e-mail to utilize between now and the next appointment to increase awareness of hunger patterns and subsequent eating. FKCLEXN provided verbal consent during today's appointment for this provider to send the handout via e-mail.

## 2020-07-17 NOTE — Telephone Encounter (Signed)
°  Office: [phone]  /  Fax: [phone]  Date of Call: July 17, 2020  Time of Call: 3:02pm Duration of Call: ~1.5 minutes Provider: Lawerance Cruel, PsyD  CONTENT: This provider called Brian Obrien to check-in as he did not present for today's MyChart Video Visit appointment at 3:00pm. Brian Obrien appeared to have forgotten about today's appointment, noting he was driving. He was unsure when he would be able to pull over and join today's appointment. As such, it was recommended he reschedule. He agreed. Brian Obrien denied experiencing suicidal and homicidal ideation, plan, or intent. He did not endorse any concerns.   PLAN: Research officer, trade union contacted Brian Obrien and he was scheduled for an appointment on July 21, 2020 at 3:00pm via MyChart Video Visit.

## 2020-07-21 ENCOUNTER — Telehealth (INDEPENDENT_AMBULATORY_CARE_PROVIDER_SITE_OTHER): Payer: No Typology Code available for payment source | Admitting: Psychology

## 2020-07-21 DIAGNOSIS — F3289 Other specified depressive episodes: Secondary | ICD-10-CM

## 2020-07-23 ENCOUNTER — Ambulatory Visit: Payer: No Typology Code available for payment source | Attending: Family Medicine | Admitting: Physical Therapy

## 2020-07-23 DIAGNOSIS — G8929 Other chronic pain: Secondary | ICD-10-CM | POA: Insufficient documentation

## 2020-07-23 DIAGNOSIS — R29898 Other symptoms and signs involving the musculoskeletal system: Secondary | ICD-10-CM | POA: Insufficient documentation

## 2020-07-23 DIAGNOSIS — M542 Cervicalgia: Secondary | ICD-10-CM | POA: Insufficient documentation

## 2020-07-23 DIAGNOSIS — M5442 Lumbago with sciatica, left side: Secondary | ICD-10-CM | POA: Insufficient documentation

## 2020-07-23 DIAGNOSIS — R293 Abnormal posture: Secondary | ICD-10-CM | POA: Insufficient documentation

## 2020-07-29 NOTE — Progress Notes (Signed)
  Office: [phone]  /  Fax: [phone]    Date: August 07, 2020   Appointment Start Time: 1:57pm Duration: 31 minutes Provider: Lawerance Cruel, Psy.D. Type of Session: Individual Therapy  Location of Patient: Home Location of Provider: Provider's Home Type of Contact: Telepsychological Visit via MyChart Video Visit  Session Content: Brian Obrien is a 34 y.o. male presenting for a follow-up appointment to address the previously established treatment goal of increasing coping skills. Today's appointment was a telepsychological visit due to COVID-19. Brian Obrien provided verbal consent for today's telepsychological appointment and he is aware he is responsible for securing confidentiality on his end of the session. Prior to proceeding with today's appointment, Brian Obrien's physical location at the time of this appointment was obtained as well a phone number he could be reached at in the event of technical difficulties. Brian Obrien and this provider participated in today's telepsychological service. Of note, today's appointment was switched to a regular telephone call at 2:00pm with Brian Obrien's verbal consent due to technical issues.   This provider conducted a brief check-in. Brian Obrien discussed his routine has not changed. Reviewed emotional and physical hunger. He believes he engages in emotional eating "20% of the time" and some times eats out of convenience. His eating habits were explored. Brian Obrien reported he typically eats breakfast on plan; however, lunch and dinner are not always on plan. He was engaged in problem solving to help eat more congruent to the plan (e.g., planning ahead of time; doubling recipes). Psychoeducation regarding triggers for emotional eating was provided. Brian Obrien was provided a handout, and encouraged to utilize the handout between now and the next appointment to increase awareness of triggers and frequency. Brian Obrien agreed. This provider also discussed behavioral strategies for  specific triggers, such as placing the utensil down when conversing to avoid mindless eating. Brian Obrien provided verbal consent during today's appointment for this provider to send a handout about triggers via e-mail. Brian Obrien was receptive to today's appointment as evidenced by openness to sharing, responsiveness to feedback, and willingness to explore triggers for emotional eating.  Mental Status Examination:  Appearance: well groomed and appropriate hygiene  Behavior: appropriate to circumstances Mood: euthymic Affect: mood congruent Speech: normal in rate, volume, and tone Eye Contact: appropriate Psychomotor Activity: appropriate Gait: unable to assess Thought Process: linear, logical, and goal directed  Thought Content/Perception: no hallucinations, delusions, bizarre thinking or behavior reported or observed and no evidence of suicidal and homicidal ideation, plan, and intent Orientation: time, person, place, and purpose of appointment Memory/Concentration: memory, attention, language, and fund of knowledge intact  Insight/Judgment: fair  Interventions:  Conducted a brief chart review Provided empathic reflections and validation Reviewed content from the previous session Employed supportive psychotherapy interventions to facilitate reduced distress and to improve coping skills with identified stressors Psychoeducation provided regarding triggers for emotional eating  DSM-5 Diagnosis(es): 311 (F32.8) Other Specified Depressive Disorder, Emotional Eating Behaviors  Treatment Goal & Progress: During the initial appointment with this provider, the following treatment goal was established: increase coping skills. Brian Obrien has demonstrated progress in his goal as evidenced by increased awareness of hunger patterns.   Plan: The next appointment will be scheduled in 2-3 weeks, which will be via MyChart Video Visit. The next session will focus on working towards the established treatment goal.  Additionally, Brian Obrien stated he will call [facility] again to schedule an initial appointment per the referral placed.

## 2020-07-30 ENCOUNTER — Encounter (INDEPENDENT_AMBULATORY_CARE_PROVIDER_SITE_OTHER): Payer: Self-pay | Admitting: Bariatrics

## 2020-07-30 ENCOUNTER — Other Ambulatory Visit: Payer: Self-pay

## 2020-07-30 ENCOUNTER — Ambulatory Visit (INDEPENDENT_AMBULATORY_CARE_PROVIDER_SITE_OTHER): Payer: No Typology Code available for payment source | Admitting: Bariatrics

## 2020-07-30 VITALS — BP 113/78 | HR 84 | Temp 97.9°F | Ht 69.0 in | Wt 247.0 lb

## 2020-07-30 DIAGNOSIS — Z6836 Body mass index (BMI) 36.0-36.9, adult: Secondary | ICD-10-CM

## 2020-07-30 DIAGNOSIS — E8881 Metabolic syndrome: Secondary | ICD-10-CM

## 2020-07-30 DIAGNOSIS — Z9189 Other specified personal risk factors, not elsewhere classified: Secondary | ICD-10-CM | POA: Diagnosis not present

## 2020-07-30 DIAGNOSIS — E559 Vitamin D deficiency, unspecified: Secondary | ICD-10-CM | POA: Diagnosis not present

## 2020-07-30 MED ORDER — METFORMIN HCL 500 MG PO TABS: 500.0000 mg | ORAL_TABLET | Freq: Two times a day (BID) | ORAL | 0 refills | Status: DC

## 2020-07-30 MED ORDER — VITAMIN D3 1.25 MG (50000 UT) PO CAPS: 50000.0000 [IU] | ORAL_CAPSULE | ORAL | 0 refills | Status: DC

## 2020-08-01 ENCOUNTER — Encounter: Payer: Self-pay | Admitting: Cardiology

## 2020-08-01 ENCOUNTER — Ambulatory Visit: Payer: No Typology Code available for payment source | Admitting: Cardiology

## 2020-08-01 ENCOUNTER — Other Ambulatory Visit: Payer: Self-pay

## 2020-08-01 VITALS — BP 119/78 | HR 88 | Ht 69.0 in | Wt 252.0 lb

## 2020-08-01 DIAGNOSIS — E8881 Metabolic syndrome: Secondary | ICD-10-CM | POA: Diagnosis not present

## 2020-08-01 DIAGNOSIS — R002 Palpitations: Secondary | ICD-10-CM | POA: Diagnosis not present

## 2020-08-01 DIAGNOSIS — G4733 Obstructive sleep apnea (adult) (pediatric): Secondary | ICD-10-CM | POA: Diagnosis not present

## 2020-08-01 DIAGNOSIS — E669 Obesity, unspecified: Secondary | ICD-10-CM

## 2020-08-01 NOTE — Patient Instructions (Signed)
Medication Instructions:  Your physician recommends that you continue on your current medications as directed. Please refer to the Current Medication list given to you today.  *If you need a refill on your cardiac medications before your next appointment, please call your pharmacy*   Lab Work: None ordered  If you have labs (blood work) drawn today and your tests are completely normal, you will receive your results only by: . MyChart Message (if you have MyChart) OR . A paper copy in the mail If you have any lab test that is abnormal or we need to change your treatment, we will call you to review the results.   Testing/Procedures: None ordered   Follow-Up: At [facility], you and your health needs are our priority.  As part of our continuing mission to provide you with exceptional heart care, we have created designated Provider Care Teams.  These Care Teams include your primary Cardiologist (physician) and Advanced Practice Providers (APPs -  Physician Assistants and Nurse Practitioners) who all work together to provide you with the care you need, when you need it.  We recommend signing up for the patient portal called "MyChart".  Sign up information is provided on this After Visit Summary.  MyChart is used to connect with patients for Virtual Visits (Telemedicine).  Patients are able to view lab/test results, encounter notes, upcoming appointments, etc.  Non-urgent messages can be sent to your provider as well.   To learn more about what you can do with MyChart, go to [email or web address].    Your next appointment:   12 month(s)  The format for your next appointment:   In Person  Provider:   Kardie Tobb, DO   Other Instructions  

## 2020-08-01 NOTE — Progress Notes (Signed)
Cardiology Office Note:    Date:  08/01/2020   ID:  Brian Obrien, DOB September 29, 1986, MRN [id number]  PCP:  Esperanza RichtersSaguier, Edward, PA-C  Cardiologist:  Thomasene RippleKardie Jeralynn Vaquera, DO  Electrophysiologist:  None   Referring MD: Esperanza RichtersSaguier, Edward, PA-C   " I am doing fine*  History of Present Illness:    Brian Larssonayyiab Caissie is a 34 y.o. male with a hx of hyperlipidemia, obesity, mild sleep apnea, presented in April 21 to be evaluated for palpitations and atypical chest pain.  At that time an echocardiogram as well as a ZIO monitor was ordered.  The patient was able to wear his ZIO monitor as well as get his echocardiogram does test were reported to be normal.   In the interim he has been doing well from a cardiovascular standpoint.  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  He noticed that today he had not had anything to eat or drink.  After that episode he got some orange juice and he felt a lot better.  He had not had any recurrent lightheadedness.  Today he denies any chest pain, shortness of breath.  [facility] they have been helping him with options for weight loss.  Past Medical History:  Diagnosis Date  . Allergy   . Anxiety   . Chest pain   . 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  . Chronic neck pain 11/12/2019  . Chronic pain of left knee 11/12/2019  . Dependence on nicotine from chewing tobacco 12/15/2017  . Depression   . Fatty liver   . GERD (gastroesophageal reflux disease)   . Hyperlipidemia   . Left hip pain   . Left sided sciatica 03/15/2018  . Lytic bone lesions on xray 02/07/2018   Formatting of this note might be different from the original. Of right iliac crest.  Seen on x-ray in 1-19 and CT on 02/05/2018. Unchanged xray in 03/2018. Repeat in 6 months (CT vs Xray)  . Metabolic syndrome 12/25/2019  . Obesity (BMI  30-39.9) 12/15/2017  . OSA (obstructive sleep apnea) 12/15/2017   2015; unable to tolerate CPAP  . Palpitations   . Severe obesity (BMI 35.0-39.9) with comorbidity (HCC) 11/12/2019  . Sleep apnea   . SOB (shortness of breath)   . Vitamin B 12 deficiency   . Vitamin D deficiency     Past Surgical History:  Procedure Laterality Date  . HAIR TRANSPLANT      Current Medications: Current Meds  Medication Sig  . albuterol (VENTOLIN HFA) 108 (90 Base) MCG/ACT inhaler Inhale 2 puffs into the lungs every 6 (six) hours as needed.  Marland Kitchen. azelastine (ASTELIN) 0.1 % nasal spray Place 2 sprays into both nostrils 2 (two) times daily. Use in each nostril as directed  . Biotin 3086510000 MCG TABS Take by mouth.  . Cholecalciferol (VITAMIN D3) 1.25 MG (50000 UT) CAPS Take 50,000 Int'l Units by mouth once a week.  . cyclobenzaprine (FLEXERIL) 10 MG tablet Take 10 mg by mouth at bedtime.  . gabapentin (NEURONTIN) 300 MG capsule Take 1 capsule (300 mg total) by mouth 3 (three) times daily.  Marland Kitchen. loratadine (CLARITIN) 10 MG tablet Take 10 mg by mouth daily.  . metFORMIN (GLUCOPHAGE) 500 MG tablet Take 1 tablet (500 mg total) by mouth 2 (two) times daily with a  meal.  . Multiple Vitamin (MULTIVITAMIN) tablet Take 1 tablet by mouth every other day.  . Omega-3 Fatty Acids (FISH OIL) 1000 MG CAPS Take by mouth.     Allergies:   Patient has no known allergies.   Social History   Socioeconomic History  . Marital status: Single    Spouse name: Not on file  . Number of children: Not on file  . Years of education: Not on file  . Highest education level: Not on file  Occupational History  . Occupation: Set designer  Tobacco Use  . Smoking status: Former Smoker    Packs/day: 0.25    Types: Cigarettes    Quit date: 03/06/2012    Years since quitting: 8.4  . Smokeless tobacco: Former Neurosurgeon    Types: Chew    Quit date: 03/17/2016  Substance and Sexual Activity  . Alcohol use: Not Currently  . Drug use: Never  . Sexual  activity: Not on file  Other Topics Concern  . Not on file  Social History Narrative  . Not on file   Social Determinants of Health   Financial Resource Strain:   . Difficulty of Paying Living Expenses: Not on file  Food Insecurity:   . Worried About Programme researcher, broadcasting/film/video in the Last Year: Not on file  . Ran Out of Food in the Last Year: Not on file  Transportation Needs:   . Lack of Transportation (Medical): Not on file  . Lack of Transportation (Non-Medical): Not on file  Physical Activity:   . Days of Exercise per Week: Not on file  . Minutes of Exercise per Session: Not on file  Stress:   . Feeling of Stress : Not on file  Social Connections:   . Frequency of Communication with Friends and Family: Not on file  . Frequency of Social Gatherings with Friends and Family: Not on file  . Attends Religious Services: Not on file  . Active Member of Clubs or Organizations: Not on file  . Attends Banker Meetings: Not on file  . Marital Status: Not on file     Family History: The patient's family history includes COPD in his father; Depression in his father; Diabetes in his mother; Heart disease in his father; High Cholesterol in his mother; High blood pressure in his mother; Kidney disease in his mother; Liver disease in his father; Obesity in his mother; Sleep apnea in his mother; Thyroid disease in his mother.  ROS:   Review of Systems  Constitution: Negative for decreased appetite, fever and weight gain.  HENT: Negative for congestion, ear discharge, hoarse voice and sore throat.   Eyes: Negative for discharge, redness, vision loss in right eye and visual halos.  Cardiovascular: Negative for chest pain, dyspnea on exertion, leg swelling, orthopnea and palpitations.  Respiratory: Negative for cough, hemoptysis, shortness of breath and snoring.   Endocrine: Negative for heat intolerance and polyphagia.  Hematologic/Lymphatic: Negative for bleeding problem. Does not  bruise/bleed easily.  Skin: Negative for flushing, nail changes, rash and suspicious lesions.  Musculoskeletal: Negative for arthritis, joint pain, muscle cramps, myalgias, neck pain and stiffness.  Gastrointestinal: Negative for abdominal pain, bowel incontinence, diarrhea and excessive appetite.  Genitourinary: Negative for decreased libido, genital sores and incomplete emptying.  Neurological: Negative for brief paralysis, focal weakness, headaches and loss of balance.  Psychiatric/Behavioral: Negative for altered mental status, depression and suicidal ideas.  Allergic/Immunologic: Negative for HIV exposure and persistent infections.    EKGs/Labs/Other Studies Reviewed:  The following studies were reviewed today:   EKG: None today, however EKG done June 22, 2020 showed normal sinus rhythm  Echo IMPRESSIONS  1. Left ventricular ejection fraction, by estimation, is 60 to 65%. The left ventricle has normal function. The left ventricle has no regional wall motion abnormalities. Left ventricular diastolic parameters were normal.  2. Right ventricular systolic function is normal. The right ventricular size is normal.  3. The mitral valve is normal in structure. No evidence of mitral valve regurgitation. No evidence of mitral stenosis.  4. The aortic valve is normal in structure. Aortic valve regurgitation is not visualized. No aortic stenosis is present.  5. The inferior vena cava is normal in size with greater than 50% respiratory variability, suggesting right atrial pressure of 3 mmHg.   Zio monitor  The patient wore the monitor for 4 days 5 hours starting 04/14/2020. Indication: Palpitations The minimum heart rate was xx  bpm, maximum heart rate was xxx  bpm, and average heart rate was xx  bpm. Predominant underlying rhythm was Sinus Rhythm.  Premature atrial complexes were rare (<1.0%). Premature Ventricular complexes were rare (<1.0%).  No ventricular tachycardia, no  pauses, No AV block, no supraventricular tachycardia and no atrial fibrillation present. 15 triggered events and 8 diary events all associated with sinus rhythm. Conclusion: Normal/unremarkable study.   Recent Labs: 03/19/2020: BUN 14; Creatinine, Ser 0.88; Magnesium 2.3; Potassium 4.1; Sodium 142; TSH 3.220  Recent Lipid Panel    Component Value Date/Time   CHOL 161 03/19/2020 1608   TRIG 175 (H) 03/19/2020 1608   HDL 35 (L) 03/19/2020 1608   CHOLHDL 4.6 03/19/2020 1608   LDLCALC 96 03/19/2020 1608    Physical Exam:    VS:  BP 119/78 (BP Location: Right Arm, Patient Position: Standing)   Pulse 88   Ht 5\' 9"  (1.753 m)   Wt 252 lb (114.3 kg)   SpO2 98%   BMI 37.21 kg/m     Wt Readings from Last 3 Encounters:  08/01/20 252 lb (114.3 kg)  07/30/20 247 lb (112 kg)  07/09/20 247 lb (112 kg)     GEN: Well nourished, well developed in no acute distress HEENT: Normal NECK: No JVD; No carotid bruits LYMPHATICS: No lymphadenopathy CARDIAC: S1S2 noted,RRR, no murmurs, rubs, gallops RESPIRATORY:  Clear to auscultation without rales, wheezing or rhonchi  ABDOMEN: Soft, non-tender, non-distended, +bowel sounds, no guarding. EXTREMITIES: No edema, No cyanosis, no clubbing MUSCULOSKELETAL:  No deformity  SKIN: Warm and dry NEUROLOGIC:  Alert and oriented x 3, non-focal PSYCHIATRIC:  Normal affect, good insight  ASSESSMENT:    1. OSA (obstructive sleep apnea)   2. Insulin resistance   3. Obesity (BMI 30-39.9)   4. Palpitations    PLAN:     1.  From a cardiovascular standpoint he is doing well.  We did orthostatic vitals in the office today this was negative.  He is continuing to adopt a healthy lifestyle and he is getting great help from the wellness center.  2.  I was again able to review his testing results with the patient in the office today. 3.  The patient understands the need to lose weight with diet and exercise. We have discussed specific strategies for this. 4.   OSA he has not been able to tolerate CPAP. 5.  Insulin resistance/metabolic syndrome he is on Metformin does  The patient is in agreement with the above plan. The patient left the office in stable condition.  The patient will  follow up in a year as needed.   Medication Adjustments/Labs and Tests Ordered: Current medicines are reviewed at length with the patient today.  Concerns regarding medicines are outlined above.  No orders of the defined types were placed in this encounter.  No orders of the defined types were placed in this encounter.   Patient Instructions  Medication Instructions:  Your physician recommends that you continue on your current medications as directed. Please refer to the Current Medication list given to you today.  *If you need a refill on your cardiac medications before your next appointment, please call your pharmacy*   Lab Work: None ordered   If you have labs (blood work) drawn today and your tests are completely normal, you will receive your results only by: Marland Kitchen MyChart Message (if you have MyChart) OR . A paper copy in the mail If you have any lab test that is abnormal or we need to change your treatment, we will call you to review the results.   Testing/Procedures: None ordered   Follow-Up: At [facility], you and your health needs are our priority.  As part of our continuing mission to provide you with exceptional heart care, we have created designated Provider Care Teams.  These Care Teams include your primary Cardiologist (physician) and Advanced Practice Providers (APPs -  Physician Assistants and Nurse Practitioners) who all work together to provide you with the care you need, when you need it.  We recommend signing up for the patient portal called "MyChart".  Sign up information is provided on this After Visit Summary.  MyChart is used to connect with patients for Virtual Visits (Telemedicine).  Patients are able to view lab/test results,  encounter notes, upcoming appointments, etc.  Non-urgent messages can be sent to your provider as well.   To learn more about what you can do with MyChart, go to [email or web address].    Your next appointment:   12 month(s)  The format for your next appointment:   In Person  Provider:   Thomasene Ripple, DO   Other Instructions      Adopting a Healthy Lifestyle.  Know what a healthy weight is for you (roughly BMI <25) and aim to maintain this   Aim for 7+ servings of fruits and vegetables daily   65-80+ fluid ounces of water or unsweet tea for healthy kidneys   Limit to max 1 drink of alcohol per day; avoid smoking/tobacco   Limit animal fats in diet for cholesterol and heart health - choose grass fed whenever available   Avoid highly processed foods, and foods high in saturated/trans fats   Aim for low stress - take time to unwind and care for your mental health   Aim for 150 min of moderate intensity exercise weekly for heart health, and weights twice weekly for bone health   Aim for 7-9 hours of sleep daily   When it comes to diets, agreement about the perfect plan isnt easy to find, even among the experts. Experts at the [facility] of [employer] developed an idea known as the Healthy Eating Plate. Just imagine a plate divided into logical, healthy portions.   The emphasis is on diet quality:   Load up on vegetables and fruits - one-half of your plate: Aim for color and variety, and remember that potatoes dont count.   Go for whole grains - one-quarter of your plate: Whole wheat, barley, wheat berries, quinoa, oats, brown rice, and foods made with them. If you  want pasta, go with whole wheat pasta.   Protein power - one-quarter of your plate: Fish, chicken, beans, and nuts are all healthy, versatile protein sources. Limit red meat.   The diet, however, does go beyond the plate, offering a few other suggestions.   Use healthy plant oils, such as olive,  canola, soy, corn, sunflower and peanut. Check the labels, and avoid partially hydrogenated oil, which have unhealthy trans fats.   If youre thirsty, drink water. Coffee and tea are good in moderation, but skip sugary drinks and limit milk and dairy products to one or two daily servings.   The type of carbohydrate in the diet is more important than the amount. Some sources of carbohydrates, such as vegetables, fruits, whole grains, and beans-are healthier than others.   Finally, stay active  Signed, Thomasene Ripple, DO  08/01/2020 3:35 PM    [facility]

## 2020-08-04 ENCOUNTER — Ambulatory Visit: Payer: No Typology Code available for payment source | Admitting: Physical Therapy

## 2020-08-04 ENCOUNTER — Other Ambulatory Visit: Payer: Self-pay

## 2020-08-04 ENCOUNTER — Encounter: Payer: Self-pay | Admitting: Physical Therapy

## 2020-08-04 DIAGNOSIS — R293 Abnormal posture: Secondary | ICD-10-CM | POA: Diagnosis present

## 2020-08-04 DIAGNOSIS — G8929 Other chronic pain: Secondary | ICD-10-CM | POA: Diagnosis present

## 2020-08-04 DIAGNOSIS — R29898 Other symptoms and signs involving the musculoskeletal system: Secondary | ICD-10-CM

## 2020-08-04 DIAGNOSIS — M5442 Lumbago with sciatica, left side: Secondary | ICD-10-CM | POA: Diagnosis present

## 2020-08-04 DIAGNOSIS — M542 Cervicalgia: Secondary | ICD-10-CM | POA: Diagnosis not present

## 2020-08-04 NOTE — Progress Notes (Signed)
Chief Complaint:   OBESITY Brian Obrien is here to discuss his progress with his obesity treatment plan along with follow-up of his obesity related diagnoses. Brian Obrien is on the Category 3 Plan and states he is following his eating plan approximately 75% of the time. Brian Obrien states he is getting 10,000 steps 3 times per week.  Today's visit was #: 3 Starting weight: 247 lbs Starting date: 06/25/2020 Today's weight: 247 lbs Today's date: 07/30/2020   Total lbs lost to date: 0 Total lbs lost since last in-office visit: 0  Interim History: Brian Obrien works the night shift.  He says he "cheats" at times.  He saw the psychologist and does not think that he is doing any emotional eating.   Subjective:   1. Insulin resistance Brian Obrien has a diagnosis of insulin resistance based on his elevated fasting insulin level >5. He continues to work on diet and exercise to decrease his risk of diabetes.    Lab Results  Component Value Date   INSULIN 29.3 (H) 06/25/2020   Lab Results  Component Value Date   HGBA1C 5.4 06/25/2020   2. Vitamin D deficiency Brian Obrien's Vitamin D level was 31.1 on 06/25/2020. He is currently taking prescription vitamin D 50,000 IU each week. He denies nausea, vomiting or muscle weakness.  3. At risk for hypoglycemia Brian Obrien is at increased risk for hypoglycemia due to changes in diet, diagnosis of IR, and/or insulin use. Brian Obrien is not currently taking insulin.     Assessment/Plan:   1. Insulin resistance Brian Obrien will continue to work on weight loss, exercise, and decreasing simple carbohydrates to help decrease the risk of diabetes. Brian Obrien agreed to follow-up with [place] as directed to closely monitor his progress.  -Increase metFORMIN (GLUCOPHAGE) 500 MG tablet; Take 1 tablet (500 mg total) by mouth 2 (two) times daily with a meal.  Dispense: 60 tablet; Refill: 0  2. Vitamin D deficiency Low Vitamin D level contributes to fatigue and are associated with obesity, breast, and  colon cancer. He agrees to continue to take prescription Vitamin D @50 ,000 IU every week and will follow-up for routine testing of Vitamin D, at least 2-3 times per year to avoid over-replacement.  -Refill Cholecalciferol (VITAMIN D3) 1.25 MG (50000 UT) CAPS; Take 50,000 Int'l Units by mouth once a week.  Dispense: 4 capsule; Refill: 0  3. At risk for hypoglycemia Brian Obrien was given approximately 15 minutes of counseling today regarding prevention of hypoglycemia. He was advised of symptoms of hypoglycemia. Brian Obrien was instructed to avoid skipping meals, eat regular protein rich meals and schedule low calorie snacks as needed.   Repetitive spaced learning was employed today to elicit superior memory formation and behavioral change  4. Class 2 severe obesity with serious comorbidity and body mass index (BMI) of 36.0 to 36.9 in adult, unspecified obesity type (HCC) Brian Obrien is currently in the action stage of change. As such, his goal is to continue with weight loss efforts. He has agreed to following a lower carbohydrate, vegetable and lean protein rich diet plan.   He will work on meal planning.  Exercise goals: Walking 10,000 steps per day.  Behavioral modification strategies: increasing lean protein intake, decreasing simple carbohydrates, increasing vegetables, increasing water intake, decreasing eating out, no skipping meals, meal planning and cooking strategies, keeping healthy foods in the home and planning for success.  Brian Obrien has agreed to follow-up with our clinic in 2 weeks with [facility] or [place]. He was informed of the importance of frequent follow-up  visits to maximize his success with intensive lifestyle modifications for his multiple health conditions.   Objective:   Blood pressure 113/78, pulse 84, temperature 97.9 F (36.6 C), height 5\' 9"  (1.753 m), weight 247 lb (112 kg), SpO2 98 %. Body mass index is 36.48 kg/m.  General: Cooperative, alert, well developed, in no acute  distress. HEENT: Conjunctivae and lids unremarkable. Cardiovascular: Regular rhythm.  Lungs: Normal work of breathing. Neurologic: No focal deficits.   Lab Results  Component Value Date   CREATININE 0.88 03/19/2020   BUN 14 03/19/2020   NA 142 03/19/2020   K 4.1 03/19/2020   CL 100 03/19/2020   CO2 26 03/19/2020   Lab Results  Component Value Date   HGBA1C 5.4 06/25/2020   Lab Results  Component Value Date   INSULIN 29.3 (H) 06/25/2020   Lab Results  Component Value Date   TSH 3.220 03/19/2020   Lab Results  Component Value Date   CHOL 161 03/19/2020   HDL 35 (L) 03/19/2020   LDLCALC 96 03/19/2020   TRIG 175 (H) 03/19/2020   CHOLHDL 4.6 03/19/2020   Attestation Statements:   Reviewed by clinician on day of visit: allergies, medications, problem list, medical history, surgical history, family history, social history, and previous encounter notes.  I, 03/21/2020, CMA, am acting as Insurance claims handler for Energy manager, DO  I have reviewed the above documentation for accuracy and completeness, and I agree with the above. [employer], DO

## 2020-08-04 NOTE — Therapy (Addendum)
[facility] [address]  [place], [place], [address] Phone: [phone]   Fax:  [phone]  Physical Therapy Discharge Summary  Patient Details  Name: Brian Obrien MRN: [id number] Date of Birth: 1985/12/23 Referring Provider (PT): Clearance Coots, MD   Encounter Date: 08/04/2020   PT End of Session - 08/04/20 1657    Visit Number 7    Number of Visits 7    Date for PT Re-Evaluation 07/02/20    Authorization Type [facility]    PT Start Time 6962    PT Stop Time 1650    PT Time Calculation (min) 33 min    Activity Tolerance Patient tolerated treatment well    Behavior During Therapy [facility] for tasks assessed/performed           Past Medical History:  Diagnosis Date  . Allergy   . Anxiety   . Chest pain   . 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  . Chronic neck pain 11/12/2019  . Chronic pain of left knee 11/12/2019  . Dependence on nicotine from chewing tobacco 12/15/2017  . Depression   . Fatty liver   . GERD (gastroesophageal reflux disease)   . Hyperlipidemia   . Left hip pain   . Left sided sciatica 03/15/2018  . Lytic bone lesions on xray 02/07/2018   Formatting of this note might be different from the original. Of right iliac crest.  Seen on x-ray in 1-19 and CT on 02/05/2018. Unchanged xray in 03/2018. Repeat in 6 months (CT vs Xray)  . Metabolic syndrome 07/27/2840  . Obesity (BMI 30-39.9) 12/15/2017  . OSA (obstructive sleep apnea) 12/15/2017   2015; unable to tolerate CPAP  . Palpitations   . Severe obesity (BMI 35.0-39.9) with comorbidity ([place]) 11/12/2019  . Sleep apnea   . SOB (shortness of breath)   . Vitamin B 12 deficiency   . Vitamin D deficiency     Past Surgical History:  Procedure Laterality Date  . HAIR TRANSPLANT      There were no vitals filed for this visit.   Subjective Assessment - 08/04/20 1618    Subjective Neck and back are feeling much better. B  hips still bother him once in a while but improved with stretching. Interested in a personal traction unit for neck pain as he notes good benefit from mechanical traction. Reports 80-85% improvement since initial eval.    Pertinent History metabolic syndrome, L sided sciatica, HLD, chronic L knee pain, chronic neck pain    Diagnostic tests 05/05/20 cervical MRI: C6-7: Shallow chronic appearing disc herniation with slight caudal down turning in the midline behind C7. At C2-3 and C3-4, there are probably small central disc bulges; 05/05/20 lumbar MRI: T12-L1: Mild chronic disc degeneration with slight loss of disc height. L5-S1: Mild desiccation of the disc but without evidence of bulge or herniation.    Patient Stated Goals "get rid of pain"    Currently in Pain? No/denies              [facility] PT Assessment - 08/04/20 0001      AROM   Cervical Flexion 54    Cervical Extension 52    Cervical - Right Side Bend 45    Cervical - Left Side Bend 48    Cervical - Right Rotation 60    Cervical - Left Rotation 65    Lumbar Flexion distal shin    Lumbar Extension [facility]  Lumbar - Right Side Bend jt line    Lumbar - Left Side Bend jt line    Lumbar - Right Rotation WFL    Lumbar - Left Rotation [facility]                         [facility] Adult PT Treatment/Exercise - 08/04/20 0001      Neck Exercises: Machines for Strengthening   UBE (Upper Arm Bike) L2 x 3 min forward/57mn back                  PT Education - 08/04/20 1656    Education Details update/consolidation of HEP; discussion on objective progress and remaining impairments    Person(s) Educated Patient    Methods Explanation;Demonstration;Tactile cues;Verbal cues;Handout    Comprehension Verbalized understanding;Returned demonstration            PT Short Term Goals - 08/04/20 1625      PT SHORT TERM GOAL #1   Title Patient to be independent with initial HEP.    Time 3    Period Weeks    Status Achieved     Target Date 06/11/20             PT Long Term Goals - 08/04/20 1625      PT LONG TERM GOAL #1   Title Patient to be independent with advanced HEP.    Time 6    Period Weeks    Status Achieved      PT LONG TERM GOAL #2   Title Patient to demonstrate cervical AROM WNL and without pain limiting.    Time 6    Period Weeks    Status Achieved      PT LONG TERM GOAL #3   Title Patient to demonstrate lumbar AROM WNL and without pain limiting.    Time 6    Period Weeks    Status Achieved      PT LONG TERM GOAL #4   Title Patient to demonstrate self-correction of posture at rest and activity to improve postural awareness.    Time 6    Period Weeks    Status Partially Met   still demonstrating considerably rounded posture but reporting improved posture while at home- admits to poor postural awareness at work     PT LNewton#5   Title Patient to report 75% improvement in pain in the AM.    Time 6    Period Weeks    Status Achieved   07/08/20                Plan - 08/04/20 1702    Clinical Impression Statement Patient reporting 80-85% improvement in neck and LB since initial eval. Reporting that his neck and back are feeling much better. B hips still bother him once in a while but improved with stretching. Patient reports that he is interested in a personal traction unit for neck pain as he notes good benefit from mechanical traction. Patient has demonstrated good improvement in cervical flexion, B sidebending, and R rotation AROM and now pain-free with cervical ROM. Lumbar AROM has improved in flexion and now also pain-free. Patient today still demonstrated a considerably rounded posture at rest but reporting improved posture while at home. Does admit to poor postural awareness at work d/t being busy. Updated and consolidated HEP for max benefit- patient reported understanding. Patient has now met or partially met all goals and is independent with HEP.  Patient now ready  for DC with transition to home program. Do recommend home traction unit for continued pain management at home d/t patient's hx of neck pain, stiffness, and poor posture.    Comorbidities metabolic syndrome, L sided sciatica, HLD, chronic L knee pain, chronic neck pain    Rehab Potential Good    PT Frequency 1x / week    PT Duration 6 weeks    PT Treatment/Interventions ADLs/Self Care Home Management;Cryotherapy;Electrical Stimulation;Moist Heat;Traction;Therapeutic exercise;Therapeutic activities;Functional mobility training;Stair training;Gait training;Ultrasound;Neuromuscular re-education;Patient/family education;Manual techniques;Taping;Energy conservation;Dry needling;Passive range of motion    PT Next Visit Plan DC at this time    Consulted and Agree with Plan of Care Patient           Patient will benefit from skilled therapeutic intervention in order to improve the following deficits and impairments:  Hypomobility, Decreased activity tolerance, Decreased strength, Pain, Increased fascial restricitons, Increased muscle spasms, Decreased range of motion, Postural dysfunction, Impaired flexibility  Visit Diagnosis: Cervicalgia  Chronic midline low back pain with left-sided sciatica  Abnormal posture  Other symptoms and signs involving the musculoskeletal system     Problem List Patient Active Problem List   Diagnosis Date Noted  . Insulin resistance 07/07/2020  . Pain of joint of left ankle and foot 05/09/2020  . Joint pain 04/03/2020  . Metabolic syndrome 14/43/1540  . 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  . Chronic neck pain 11/12/2019  . Chronic pain of left knee 11/12/2019  . Severe obesity (BMI 35.0-39.9) with comorbidity ([place]) 11/12/2019  . Left sided sciatica 03/15/2018  . Lytic bone lesions on xray 02/07/2018  . Dependence on nicotine from chewing tobacco 12/15/2017  . Obesity (BMI 30-39.9) 12/15/2017  . OSA (obstructive sleep  apnea) 12/15/2017    PHYSICAL THERAPY DISCHARGE SUMMARY  Visits from Start of Care: 7  Current functional level related to goals / functional outcomes: See above clinical impression   Remaining deficits: Decreased postural awareness   Education / Equipment: HEP  Plan: Patient agrees to discharge.  Patient goals were partially met. Patient is being discharged due to meeting the stated rehab goals.  ?????     Janene Harvey, PT, DPT 08/04/20 6:13 PM    [facility] [address]  [place], [place], [address] Phone: [phone]   Fax:  [phone]  Name: Brian Obrien MRN: [id number] Date of Birth: 05-Sep-1986

## 2020-08-05 ENCOUNTER — Encounter (INDEPENDENT_AMBULATORY_CARE_PROVIDER_SITE_OTHER): Payer: Self-pay | Admitting: Bariatrics

## 2020-08-07 ENCOUNTER — Telehealth (INDEPENDENT_AMBULATORY_CARE_PROVIDER_SITE_OTHER): Payer: No Typology Code available for payment source | Admitting: Psychology

## 2020-08-07 DIAGNOSIS — F3289 Other specified depressive episodes: Secondary | ICD-10-CM

## 2020-08-07 MED FILL — METFORMIN HCL 500 MG TABS: 500 | 30 days supply | Qty: 60 | Fill #0

## 2020-08-11 ENCOUNTER — Other Ambulatory Visit: Payer: Self-pay | Admitting: Acute Care

## 2020-08-11 ENCOUNTER — Ambulatory Visit: Payer: No Typology Code available for payment source | Admitting: Pulmonary Disease

## 2020-08-11 ENCOUNTER — Encounter: Payer: Self-pay | Admitting: Acute Care

## 2020-08-11 ENCOUNTER — Other Ambulatory Visit: Payer: Self-pay

## 2020-08-11 ENCOUNTER — Ambulatory Visit: Payer: No Typology Code available for payment source | Admitting: Acute Care

## 2020-08-11 VITALS — BP 122/74 | HR 82 | Temp 97.6°F | Ht 70.0 in | Wt 249.6 lb

## 2020-08-11 DIAGNOSIS — G4733 Obstructive sleep apnea (adult) (pediatric): Secondary | ICD-10-CM | POA: Diagnosis not present

## 2020-08-11 DIAGNOSIS — R0981 Nasal congestion: Secondary | ICD-10-CM | POA: Diagnosis not present

## 2020-08-11 DIAGNOSIS — Z23 Encounter for immunization: Secondary | ICD-10-CM | POA: Diagnosis not present

## 2020-08-11 MED ORDER — AZELASTINE HCL 0.1 % NA SOLN: 2.0000 | Freq: Two times a day (BID) | NASAL | 2 refills | Status: DC

## 2020-08-11 NOTE — Progress Notes (Signed)
History of Present Illness Brian Obrien is a 34 y.o. male former smoker with dyspnea, hyperlipidemia, obesity and Mild OSA ( Unable to tolerate CPAP therapy in the past.)  . He is followed by Dr. Craige Cotta.    08/11/2020  Pt. Presents for follow up. He was seen by Dr. Craige Cotta 04/25/2020 for complaints of dyspnea x 1 year. He states this  can happen at rest.  He feels a fullness in his chest that is improved when he leans forward.  He feels worse after he eats a meal.  He is not having cough, wheeze, or sputum.  Has chronic allergies.  No history of pneumonia or TB.  Works as an Set designer.  Hasn't noticed any problem with muscle strength.  Not having leg swelling.  No skin rashes currently.  Has tried albuterol w/o benefit.  His father has COPD.  He quit smoking several years ago.  He was diagnosed with sleep apnea several years ago and was on CPAP.  He had trouble tolerating the mask and hasn't used in years.  He still snores, and wakes up feeling short of breath.  He does get sleepy during the day.Dr. Evlyn Courier plan of care included pulmonary function test, CT chest with IV contrast, and home sleep study. Pt is here today to discuss results of these tests, all of which are WNL Pt. Presents for follow up. He states his dyspnea is about the same. He has not had to use his rescue inhaler . We have reviewed the results of his home sleep study , which shows moderate OSA ( AHI of 6.1 with SaO2 Nadir of 82%). He states he has a CPAP machine, but he is unable to tolerate it. He has tried multiple types of masks. He is open to trying an oral appliance. We discussed that once the oral appliance is made we will need to do an overnight oximetry study to ensure the oral appliance has controlled both his apnea and his oxygen desaturations. .He has been seen by cardiology, and has been cleared with no significant issues. He was referred to health weight and wellness by his PCP. He is on diet which limiting carb intake. He has  started to work out.  He denies any fever, chest pain, orthopnea or hemoptysis     Test Results: CT Chest 05/09/2020 Cardiovascular: Heart is normal size. Thoracic aorta is normal in caliber without aneurysm or dissection. Pulmonary arterial system is unremarkable. Remaining vascular structures are normal.  Mediastinum/Nodes: No mediastinal or hilar adenopathy. Remaining mediastinal structures are normal.  Lungs/Pleura: Lungs are adequately inflated and otherwise clear. Airways are normal.  Upper Abdomen: No acute findings.  Musculoskeletal: No focal abnormality.  IMPRESSION: No acute or chronic cardiopulmonary findings  Home Sleep Study 06/10/2020 Mild OSA with AHI of 6.1, SaO2 low of 82% Recommendation>> Weight loss, CPAP, oral appliance, surgical assessment            CXR 01/03/20 was normal.   Labs from 01/03/20 Hb 15.5, BNP 3.2, Creatinine 0.75, CO2 20, LFT normal, TSH 1.48, D dimer < 0.2.  Labs from 04/03/20 ANA and RF negative. Echo 03/26/20 >> EF 60 to 65% No flowsheet data found.  BMP Latest Ref Rng & Units 03/19/2020  Glucose 65 - 99 mg/dL 77  BUN 6 - 20 mg/dL 14  Creatinine 7.12 - 4.58 mg/dL 0.99  BUN/Creat Ratio 9 - 20 16  Sodium 134 - 144 mmol/L 142  Potassium 3.5 - 5.2 mmol/L 4.1  Chloride 96 -  106 mmol/L 100  CO2 20 - 29 mmol/L 26  Calcium 8.7 - 10.2 mg/dL 41.7    BNP No results found for: BNP  ProBNP No results found for: PROBNP  PFT    Component Value Date/Time   FEV1PRE 3.70 06/10/2020 1603   FEV1POST 3.79 06/10/2020 1603   FVCPRE 4.77 06/10/2020 1603   FVCPOST 4.86 06/10/2020 1603   TLC 7.13 06/10/2020 1603   DLCOUNC 31.40 06/10/2020 1603   PREFEV1FVCRT 77 06/10/2020 1603   PSTFEV1FVCRT 78 06/10/2020 1603    No results found.   Past medical hx Past Medical History:  Diagnosis Date  . Allergy   . Anxiety   . Chest pain   . 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  . Chronic neck pain  11/12/2019  . Chronic pain of left knee 11/12/2019  . Dependence on nicotine from chewing tobacco 12/15/2017  . Depression   . Fatty liver   . GERD (gastroesophageal reflux disease)   . Hyperlipidemia   . Left hip pain   . Left sided sciatica 03/15/2018  . Lytic bone lesions on xray 02/07/2018   Formatting of this note might be different from the original. Of right iliac crest.  Seen on x-ray in 1-19 and CT on 02/05/2018. Unchanged xray in 03/2018. Repeat in 6 months (CT vs Xray)  . Metabolic syndrome 12/25/2019  . Obesity (BMI 30-39.9) 12/15/2017  . OSA (obstructive sleep apnea) 12/15/2017   2015; unable to tolerate CPAP  . Palpitations   . Severe obesity (BMI 35.0-39.9) with comorbidity ([facility]) 11/12/2019  . Sleep apnea   . SOB (shortness of breath)   . Vitamin B 12 deficiency   . Vitamin D deficiency      Social History   Tobacco Use  . Smoking status: Former Smoker    Packs/day: 0.25    Types: Cigarettes    Quit date: 03/06/2012    Years since quitting: 8.4  . Smokeless tobacco: Former Neurosurgeon    Types: Chew    Quit date: 03/17/2016  Substance Use Topics  . Alcohol use: Not Currently  . Drug use: Never    Brian Obrien reports that he quit smoking about 8 years ago. His smoking use included cigarettes. He smoked 0.25 packs per day. He quit smokeless tobacco use about 4 years ago.  His smokeless tobacco use included chew. He reports previous alcohol use. He reports that he does not use drugs.  Tobacco Cessation: Former Smoker , Quit 2013  Past surgical hx, Family hx, Social hx all reviewed.  Current Outpatient Medications on File Prior to Visit  Medication Sig  . albuterol (VENTOLIN HFA) 108 (90 Base) MCG/ACT inhaler Inhale 2 puffs into the lungs every 6 (six) hours as needed.  . Biotin 40814 MCG TABS Take by mouth.  . Cholecalciferol (VITAMIN D3) 1.25 MG (50000 UT) CAPS Take 50,000 Int'l Units by mouth once a week.  . cyclobenzaprine (FLEXERIL) 10 MG tablet Take 10 mg by mouth at  bedtime.  . gabapentin (NEURONTIN) 300 MG capsule Take 1 capsule (300 mg total) by mouth 3 (three) times daily.  Marland Kitchen loratadine (CLARITIN) 10 MG tablet Take 10 mg by mouth daily.  . metFORMIN (GLUCOPHAGE) 500 MG tablet Take 1 tablet (500 mg total) by mouth 2 (two) times daily with a meal.  . Multiple Vitamin (MULTIVITAMIN) tablet Take 1 tablet by mouth every other day.  . Omega-3 Fatty Acids (FISH OIL) 1000 MG CAPS Take by mouth.   No current facility-administered  medications on file prior to visit.     No Known Allergies  Review Of Systems:  Constitutional:   No  weight loss, night sweats,  Fevers, chills, fatigue, or  lassitude.  HEENT:   No headaches,  Difficulty swallowing,  Tooth/dental problems, or  Sore throat,                No sneezing, itching, ear ache, nasal congestion, post nasal drip,   CV:  No chest pain,  Orthopnea, PND, swelling in lower extremities, anasarca, dizziness, palpitations, syncope.   GI  No heartburn, indigestion, abdominal pain, nausea, vomiting, diarrhea, change in bowel habits, loss of appetite, bloody stools.   Resp: + shortness of breath with exertion less at rest.  No excess mucus, no productive cough,  No non-productive cough,  No coughing up of blood.  No change in color of mucus.  No wheezing.  No chest wall deformity  Skin: no rash or lesions.  GU: no dysuria, change in color of urine, no urgency or frequency.  No flank pain, no hematuria   MS:  No joint pain or swelling.  No decreased range of motion.  No back pain.  Psych:  No change in mood or affect. No depression or anxiety.  No memory loss.   Vital Signs BP 122/74 (BP Location: Left Arm, Cuff Size: Normal)   Pulse 82   Temp 97.6 F (36.4 C) (Oral)   Ht 5\' 10"  (1.778 m)   Wt 249 lb 9.6 oz (113.2 kg)   SpO2 98%   BMI 35.81 kg/m    Physical Exam:  General- No distress,  A&Ox3, pleasant ENT: No sinus tenderness, TM clear, pale nasal mucosa, no oral exudate,no post nasal drip, no  LAN Cardiac: S1, S2, regular rate and rhythm, no murmur Chest: No wheeze/ rales/ dullness; no accessory muscle use, no nasal flaring, no sternal retractions Abd.: Soft Non-tender, ND, BS +, Body mass index is 35.81 kg/m. Ext: No clubbing cyanosis, edema Neuro:  normal strength, MAE x 4, A&O x 3 Skin: No rashes,No lesions,  warm and dry, intact Psych: normal mood and behavior   Assessment/Plan  Dyspnea on exertion. -  pulmonary function testing and CT chest with IV contrast all normal - Cardiology work up negative - Suspect Physical/ Obesity Deconditioning Plan Continue to work out and exercise Continue working with healthy weight and wellness to achieve a healthy weight  Snoring with history of obstructive sleep apnea. - Home sleep study showed mild OSA with AHI of 6.1, Saturation nadir of 82% - Unable to tolerate CPAP Plan We will refer you to Dr. for an oral appliance for your OSA You should get a call to get this scheduled  We will do a O&O once you have your oral appliance to make sure this has resolved your oxygen desaturations. If not, we will order night time oxygen therapy Follow up after sleep appliance has been made  So we can schedule O&O. Please call the office and let Brian Obrien know when you have your oral appliance , so we can schedule O&O  Health care Maintenance Pfhizer vaccine 11/18/2019 and 12/09/2019 Plan Flu vaccine  today   12/11/2019, NP 08/11/2020  5:26 PM

## 2020-08-11 NOTE — Patient Instructions (Addendum)
It is good to see you today.   We will place an order for an oral appliance for your sleep apnea.  Once you have your oral appliance, we will need to do an overnight oximetry test to ensure the mouth piece is managing the oxygen desaturations noted on your sleep study. Work on exercise and diet >> Health weight and wellness clinic as you have been doing. We will refill your Astelin nasal spray 2 sprays once daily. Flu vaccine today Follow up with Dr. Craige Cotta or Maralyn Sago NP after mouth appliance has been made  Please contact office for sooner follow up if symptoms do not improve or worsen or seek emergency care.

## 2020-08-12 NOTE — Progress Notes (Signed)
Office: [phone]  /  Fax: [phone]    Date: August 26, 2020   Appointment Start Time: 3:26pm Duration: 23 minutes Provider: Lawerance Cruel, Psy.D. Type of Session: Individual Therapy  Location of Patient: Home Location of Provider: Provider's Home Type of Contact: Telepsychological Visit via MyChart Video Visit  Session Content: Brian Obrien is a 34 y.o. male presenting for a follow-up appointment to address the previously established treatment goal of increasing coping skills. Today's appointment was a telepsychological visit due to COVID-19. OVZCHYI provided verbal consent for today's telepsychological appointment and he is aware he is responsible for securing confidentiality on his end of the session. Prior to proceeding with today's appointment, Brian Obrien physical location at the time of this appointment was obtained as well a phone number he could be reached at in the event of technical difficulties. Brian Obrien and this provider participated in today's telepsychological service.   This provider conducted a brief check-in and administered PHQ-9 and GAD-7 for symptom monitoring. Brian Obrien acknowledged he was previously eating "out of convenience," noting he started to meal prep on days off. He also shared he is exercising regularly. Positive reinforcement was provided. Reviewed triggers for emotional eating. He reported a reduction in emotional eating. Psychoeducation regarding mindfulness was provided. A handout was provided to [facility] with further information regarding mindfulness, including exercises. This provider also explained the benefit of mindfulness as it relates to emotional eating. Brian Obrien was encouraged to engage in the provided exercises between now and the next appointment with this provider. Brian Obrien agreed. During today's appointment, Brian Obrien was led through a mindfulness exercise involving his senses. Brian Obrien provided verbal consent during today's appointment for this provider to  send a handout about mindfulness via e-mail. Notably, this provider discussed her upcoming maternity leave toward the end of November. Brian Obrien acknowledged understanding given the uncertain nature of the circumstances, this provider may be out of the office sooner. This provider and Brian Obrien discussed referral options and verbal consent was provided for this provider to send a list of referral options via e-mail. All questions/concerns were addressed. Brian Obrien denied any concerns. Furthermore, termination planning was discussed. Brian Obrien was receptive to a follow-up appointment in 3-4 weeks and an additional follow-up/termination appointment in 3-4 weeks after that. Brian Obrien was receptive to today's appointment as evidenced by openness to sharing, responsiveness to feedback, and willingness to engage in mindfulness exercises.  Mental Status Examination:  Appearance: well groomed and appropriate hygiene  Behavior: appropriate to circumstances Mood: euthymic Affect: mood congruent Speech: normal in rate, volume, and tone Eye Contact: appropriate Psychomotor Activity: appropriate Gait: unable to assess Thought Process: linear, logical, and goal directed  Thought Content/Perception: no hallucinations, delusions, bizarre thinking or behavior reported or observed and no evidence of suicidal and homicidal ideation, plan, and intent Orientation: time, person, place, and purpose of appointment Memory/Concentration: memory, attention, language, and fund of knowledge intact  Insight/Judgment: good  Structured Assessments Results: The Patient Health Questionnaire-9 (PHQ-9) is a self-report measure that assesses symptoms and severity of depression over the course of the last two weeks. Brian Obrien obtained a score of 0. [0= Not at all; 1= Several days; 2= More than half the days; 3= Nearly every day] Little interest or pleasure in doing things 0  Feeling down, depressed, or hopeless 0  Trouble falling or staying  asleep, or sleeping too much 0  Feeling tired or having little energy 0  Poor appetite or overeating 0  Feeling bad about yourself --- or that you are a failure or have let yourself  or your family down 0  Trouble concentrating on things, such as reading the newspaper or watching television 0  Moving or speaking so slowly that other people could have noticed? Or the opposite --- being so fidgety or restless that you have been moving around a lot more than usual 0  Thoughts that you would be better off dead or hurting yourself in some way 0  PHQ-9 Score 0    The Generalized Anxiety Disorder-7 (GAD-7) is a brief self-report measure that assesses symptoms of anxiety over the course of the last two weeks. Brian Obrien obtained a score of 0. [0= Not at all; 1= Several days; 2= Over half the days; 3= Nearly every day] Feeling nervous, anxious, on edge 0  Not being able to stop or control worrying 0  Worrying too much about different things 0  Trouble relaxing 0  Being so restless that it's hard to sit still 0  Becoming easily annoyed or irritable 0  Feeling afraid as if something awful might happen 0  GAD-7 Score 0   Interventions:  Conducted a brief chart review Verbally administered PHQ-9 and GAD-7 for symptom monitoring Provided empathic reflections and validation Reviewed content from the previous session Employed motivational interviewing skills to assess patient's willingness/desire to adhere to recommended medical treatments and assignments Psychoeducation provided regarding mindfulness Engaged patient in mindfulness exercise(s) Employed acceptance and commitment interventions to emphasize mindfulness and acceptance without struggle Discussed termination planning  Provided positive reinforcement   DSM-5 Diagnosis(es): 311 (F32.8) Other Specified Depressive Disorder, Emotional Eating Behaviors  Treatment Goal & Progress: During the initial appointment with this provider, the following  treatment goal was established: increase coping skills. Brian Obrien has demonstrated progress in his goal as evidenced by increased awareness of hunger patterns, increased awareness of triggers for emotional eating and reduction in emotional eating. Brian Obrien also continues to demonstrate willingness to engage in learned skill(s).  Plan: The next appointment will be scheduled in three weeks, which will be via MyChart Video Visit. The next session will focus on working towards the established treatment goal. Notably, Brian Obrien declined longer-term therapeutic services at this time due to improved mood and overall functioning. He was receptive to reaching out to [facility] in the future if needed.

## 2020-08-13 NOTE — Progress Notes (Signed)
Reviewed and agree with assessment/plan.   Linna Thebeau, MD [facility] 08/13/2020, 9:12 AM Pager:  [phone]  

## 2020-08-14 ENCOUNTER — Other Ambulatory Visit: Payer: Self-pay

## 2020-08-14 ENCOUNTER — Encounter (INDEPENDENT_AMBULATORY_CARE_PROVIDER_SITE_OTHER): Payer: Self-pay | Admitting: Family Medicine

## 2020-08-14 ENCOUNTER — Ambulatory Visit (INDEPENDENT_AMBULATORY_CARE_PROVIDER_SITE_OTHER): Payer: No Typology Code available for payment source | Admitting: Family Medicine

## 2020-08-14 VITALS — BP 105/69 | HR 68 | Temp 97.9°F | Ht 69.0 in | Wt 242.0 lb

## 2020-08-14 DIAGNOSIS — Z9189 Other specified personal risk factors, not elsewhere classified: Secondary | ICD-10-CM | POA: Diagnosis not present

## 2020-08-14 DIAGNOSIS — E8881 Metabolic syndrome: Secondary | ICD-10-CM

## 2020-08-14 DIAGNOSIS — E559 Vitamin D deficiency, unspecified: Secondary | ICD-10-CM | POA: Insufficient documentation

## 2020-08-14 DIAGNOSIS — E88819 Insulin resistance, unspecified: Secondary | ICD-10-CM

## 2020-08-14 MED ORDER — VITAMIN D3 1.25 MG (50000 UT) PO CAPS: 50000.0000 [IU] | ORAL_CAPSULE | ORAL | 0 refills | Status: DC

## 2020-08-15 NOTE — Progress Notes (Signed)
Chief Complaint:   OBESITY Brian Obrien is here to discuss his progress with his obesity treatment plan along with follow-up of his obesity related diagnoses. [name] is on following a lower carbohydrate, vegetable and lean protein rich diet plan and states he is following his eating plan approximately 85% of the time. [name] states he is doing strengthening for 60 minutes 3 times per week.  Today's visit was #: 4 Starting weight: 242 lbs Starting date: 06/25/2020 Today's weight: 247 lbs Today's date: 08/14/2020 Total lbs lost to date: 0 Total lbs lost since last in-office visit: 5  Interim History: Brian Obrien likes the Low carbohydrate plan better than cat 3 plan and he is down 5 lbs today. He is eating protein at 3 meals per day. He has started working out with a trainer 3 times per week. He would like to add protein powder 3 times per week with workouts. He denies polyphagia. His vegetable and water intake is good.  Subjective:   1. Vitamin D deficiency Brian Obrien's Vit D level is low at 31. He is on prescription Vit D weekly.  2. Insulin resistance Brian Obrien is on metformin 500 mg BID, and he denies nausea or diarrhea as long as he takes it with a meal. He denies polyphagia.  Lab Results  Component Value Date   INSULIN 29.3 (H) 06/25/2020   Lab Results  Component Value Date   HGBA1C 5.4 06/25/2020   3. At risk for constipation Brian Obrien is at increased risk for constipation due to Low carbohydrate plan.  Assessment/Plan:   1. Vitamin D deficiency  We will refill prescription Vitamin D for 1 month. - Cholecalciferol (VITAMIN D3) 1.25 MG (50000 UT) CAPS; Take 50,000 Int'l Units by mouth once a week.  Dispense: 4 capsule; Refill: 0  2. Insulin resistance [name] will continue his meal plan and metformin.  3. At risk for constipation Brian Obrien was given approximately 15 minutes of counseling today regarding prevention of constipation. He was encouraged to increase water and fiber  intake.   4. Class 2 severe obesity with body mass index (BMI) of 35 to 39.9 with serious comorbidity (HCC) Brian Obrien is currently in the action stage of change. As such, his goal is to continue with weight loss efforts. He has agreed to following a lower carbohydrate, vegetable and lean protein rich diet plan.   Brian Obrien may have one servings of protein powder (pea or whey) 3 times per week.  Exercise goals: As is.  Behavioral modification strategies: planning for success.  [name] has agreed to follow-up with our clinic in 2 weeks.  Objective:   Blood pressure 105/69, pulse 68, temperature 97.9 F (36.6 C), temperature source Oral, height 5\' 9"  (1.753 m), weight 242 lb (109.8 kg), SpO2 96 %. Body mass index is 35.74 kg/m.  General: Cooperative, alert, well developed, in no acute distress. HEENT: Conjunctivae and lids unremarkable. Cardiovascular: Regular rhythm.  Lungs: Normal work of breathing. Neurologic: No focal deficits.   Lab Results  Component Value Date   CREATININE 0.88 03/19/2020   BUN 14 03/19/2020   NA 142 03/19/2020   K 4.1 03/19/2020   CL 100 03/19/2020   CO2 26 03/19/2020   No results found for: ALT, AST, GGT, ALKPHOS, BILITOT Lab Results  Component Value Date   HGBA1C 5.4 06/25/2020   Lab Results  Component Value Date   INSULIN 29.3 (H) 06/25/2020   Lab Results  Component Value Date   TSH 3.220 03/19/2020   Lab Results  Component  Value Date   CHOL 161 03/19/2020   HDL 35 (L) 03/19/2020   LDLCALC 96 03/19/2020   TRIG 175 (H) 03/19/2020   CHOLHDL 4.6 03/19/2020   No results found for: WBC, HGB, HCT, MCV, PLT No results found for: IRON, TIBC, FERRITIN  Attestation Statements:   Reviewed by clinician on day of visit: allergies, medications, problem list, medical history, surgical history, family history, social history, and previous encounter notes.   Trude Mcburney, am acting as Energy manager for [place], FNP-C.  I have reviewed  the above documentation for accuracy and completeness, and I agree with the above. -  Jesse Sans, FNP

## 2020-08-18 ENCOUNTER — Encounter (INDEPENDENT_AMBULATORY_CARE_PROVIDER_SITE_OTHER): Payer: Self-pay | Admitting: Family Medicine

## 2020-08-18 MED FILL — AZELASTINE HCL 137 MCG SPRY: 0.1 | 25 days supply | Qty: 30 | Fill #0

## 2020-08-21 MED FILL — VIT D3-50 50,000 UNITS CAPS: 1.25 MG | 28 days supply | Qty: 4 | Fill #0

## 2020-08-25 ENCOUNTER — Encounter (INDEPENDENT_AMBULATORY_CARE_PROVIDER_SITE_OTHER): Payer: Self-pay

## 2020-08-26 ENCOUNTER — Telehealth (INDEPENDENT_AMBULATORY_CARE_PROVIDER_SITE_OTHER): Payer: No Typology Code available for payment source | Admitting: Psychology

## 2020-08-26 DIAGNOSIS — F3289 Other specified depressive episodes: Secondary | ICD-10-CM | POA: Diagnosis not present

## 2020-08-28 ENCOUNTER — Other Ambulatory Visit: Payer: Self-pay

## 2020-08-28 ENCOUNTER — Encounter (INDEPENDENT_AMBULATORY_CARE_PROVIDER_SITE_OTHER): Payer: Self-pay | Admitting: Family Medicine

## 2020-08-28 ENCOUNTER — Ambulatory Visit (INDEPENDENT_AMBULATORY_CARE_PROVIDER_SITE_OTHER): Payer: No Typology Code available for payment source | Admitting: Family Medicine

## 2020-08-28 VITALS — BP 112/70 | HR 61 | Temp 97.8°F | Ht 69.0 in | Wt 238.0 lb

## 2020-08-28 DIAGNOSIS — E8881 Metabolic syndrome: Secondary | ICD-10-CM

## 2020-08-28 DIAGNOSIS — E88819 Insulin resistance, unspecified: Secondary | ICD-10-CM

## 2020-08-28 DIAGNOSIS — F3289 Other specified depressive episodes: Secondary | ICD-10-CM | POA: Diagnosis not present

## 2020-08-28 MED ORDER — METFORMIN HCL 500 MG PO TABS: 500.0000 mg | ORAL_TABLET | Freq: Every day | ORAL | 0 refills | Status: DC

## 2020-08-28 MED FILL — AZELASTINE HCL 137 MCG SPRY: 0.1 | 25 days supply | Qty: 30 | Fill #0

## 2020-08-28 NOTE — Progress Notes (Signed)
Chief Complaint:   OBESITY Brian Obrien is here to discuss his progress with his obesity treatment plan along with follow-up of his obesity related diagnoses. Brian Obrien is on following a lower carbohydrate, vegetable and lean protein rich diet plan and states he is following his eating plan approximately 85% of the time. Brian Obrien states he is doing strengthening for 90 minutes 3 times per week.  Today's visit was #: 5 Starting weight: 242 lbs Starting date: 06/25/2020 Today's weight: 238 lbs Today's date: 08/28/2020 Total lbs lost to date: 4 Total lbs lost since last in-office visit: 4  Interim History: Brian Obrien has adhered to the low carbohydrate plan very well except for 2 meals. He is very happy with the low carbohydrate plan. He is working with a trainer 3 times per week and doing a combination of cardio and resistance.  Subjective:   1. Insulin resistance Brian Obrien has a diagnosis of insulin resistance based on his elevated fasting insulin level >5. Brian Obrien noted some chest and stomach discomfort with second dose of metformin. He has stopped his second dose and feels this has alleviated his symptoms. He denies excessive hunger.   Lab Results  Component Value Date   INSULIN 29.3 (H) 06/25/2020   Lab Results  Component Value Date   HGBA1C 5.4 06/25/2020   2. Other depression, with emotional eating Brian Obrien denies stress eating at this time. He is much more mindful of eating and doing more meal prepping.  Assessment/Plan:   1. Insulin resistance  Brian Obrien agreed to decrease metformin to 500 mg q daily with no refill.  - metFORMIN (GLUCOPHAGE) 500 MG tablet; Take 1 tablet (500 mg total) by mouth daily with breakfast.  Dispense: 30 tablet; Refill: 0  2. Other depression, with emotional eating  Brian Obrien will continue to see Dr, Dewaine Conger, our Bariatric Psychologist, next appointment is 09/16/2020.   3. Class 2 severe obesity with body mass index (BMI) of 35 to 39.9 with serious comorbidity  (HCC) Brian Obrien is currently in the action stage of change. As such, his goal is to continue with weight loss efforts. He has agreed to following a lower carbohydrate, vegetable and lean protein rich diet plan.   Exercise goals: As is.  Behavioral modification strategies: increasing lean protein intake and planning for success.  Brian Obrien has agreed to follow-up with our clinic in 3 weeks.   Objective:   Blood pressure 112/70, pulse 61, temperature 97.8 F (36.6 C), temperature source Oral, height 5\' 9"  (1.753 m), weight 238 lb (108 kg), SpO2 99 %. Body mass index is 35.15 kg/m.  General: Cooperative, alert, well developed, in no acute distress. HEENT: Conjunctivae and lids unremarkable. Cardiovascular: Regular rhythm.  Lungs: Normal work of breathing. Neurologic: No focal deficits.   Lab Results  Component Value Date   CREATININE 0.88 03/19/2020   BUN 14 03/19/2020   NA 142 03/19/2020   K 4.1 03/19/2020   CL 100 03/19/2020   CO2 26 03/19/2020   No results found for: ALT, AST, GGT, ALKPHOS, BILITOT Lab Results  Component Value Date   HGBA1C 5.4 06/25/2020   Lab Results  Component Value Date   INSULIN 29.3 (H) 06/25/2020   Lab Results  Component Value Date   TSH 3.220 03/19/2020   Lab Results  Component Value Date   CHOL 161 03/19/2020   HDL 35 (L) 03/19/2020   LDLCALC 96 03/19/2020   TRIG 175 (H) 03/19/2020   CHOLHDL 4.6 03/19/2020   No results found for: WBC, HGB, HCT,  MCV, PLT No results found for: IRON, TIBC, FERRITIN  Attestation Statements:   Reviewed by clinician on day of visit: allergies, medications, problem list, medical history, surgical history, family history, social history, and previous encounter notes.   Trude Mcburney, am acting as Energy manager for [place], FNP-C.  I have reviewed the above documentation for accuracy and completeness, and I agree with the above. -  Jesse Sans, FNP

## 2020-09-01 ENCOUNTER — Encounter (INDEPENDENT_AMBULATORY_CARE_PROVIDER_SITE_OTHER): Payer: Self-pay | Admitting: Family Medicine

## 2020-09-01 DIAGNOSIS — F32A Depression, unspecified: Secondary | ICD-10-CM | POA: Insufficient documentation

## 2020-09-01 MED FILL — METFORMIN HCL 500 MG TABS: 500 | 30 days supply | Qty: 30 | Fill #0

## 2020-09-02 NOTE — Progress Notes (Signed)
  Office: [phone]  /  Fax: [phone]    Date: September 16, 2020   Appointment Start Time: 12:29pm Duration: 20 minutes Provider: Lawerance Cruel, Psy.D. Type of Session: Individual Therapy  Location of Patient: Home Location of Provider: Provider's Home Type of Contact: Telepsychological Visit via MyChart Video Visit  Session Content: Brian Obrien is a 34 y.o. male presenting for a follow-up appointment to address the previously established treatment goal of increasing coping skills. Today's appointment was a telepsychological visit due to COVID-19. Brian Obrien provided verbal consent for today's telepsychological appointment and he is aware he is responsible for securing confidentiality on his end of the session. Prior to proceeding with today's appointment, Brian Obrien's physical location at the time of this appointment was obtained as well a phone number he could be reached at in the event of technical difficulties. Brian Obrien and this provider participated in today's telepsychological service.   This provider conducted a brief check-in. Brian Obrien reported nothing has changed since the last appointment. He noted he continues to follow his meal plan and engage in physical activity, adding he has noticed an improvement in how he feels physically and emotionally. Session focused further on mindfulness to assist with coping. He discussed engaging in mindfulness exercises regularly, noting, "It has been helpful." Brian Obrien was led through a mindfulness exercise (A Taste of Mindfulness) and his experience was processed. Brian Obrien provided verbal consent during today's appointment for this provider to send the handout for today's exercise via e-mail. This provider also discussed the utilization of YouTube for mindfulness exercises (e.g., exercises by Rhae Hammock). Brian Obrien was receptive to today's appointment as evidenced by openness to sharing, responsiveness to feedback, and willingness to continue engaging in  mindfulness exercises.  Mental Status Examination:  Appearance: well groomed and appropriate hygiene  Behavior: appropriate to circumstances Mood: euthymic Affect: mood congruent Speech: normal in rate, volume, and tone Eye Contact: appropriate Psychomotor Activity: appropriate Gait: unable to assess Thought Process: linear, logical, and goal directed  Thought Content/Perception: no hallucinations, delusions, bizarre thinking or behavior reported or observed and no evidence of suicidal and homicidal ideation, plan, and intent Orientation: time, person, place, and purpose of appointment Memory/Concentration: memory, attention, language, and fund of knowledge intact  Insight/Judgment: good  Interventions:  Conducted a brief chart review Provided empathic reflections and validation Reviewed content from the previous session Employed supportive psychotherapy interventions to facilitate reduced distress and to improve coping skills with identified stressors Engaged patient in mindfulness exercise(s) Employed acceptance and commitment interventions to emphasize mindfulness and acceptance without struggle  DSM-5 Diagnosis(es): 311 (F32.8) Other Specified Depressive Disorder, Emotional Eating Behaviors  Treatment Goal & Progress: During the initial appointment with this provider, the following treatment goal was established: increase coping skills. Brian Obrien has demonstrated progress in his goal as evidenced by increased awareness of hunger patterns and increased awareness of triggers for emotional eating. Brian Obrien also continues to demonstrate willingness to engage in learned skill(s).  Plan: The next appointment will be scheduled in three weeks, which will be via MyChart Video Visit. The next session will focus on working towards the established treatment goal and termination.

## 2020-09-16 ENCOUNTER — Telehealth (INDEPENDENT_AMBULATORY_CARE_PROVIDER_SITE_OTHER): Payer: No Typology Code available for payment source | Admitting: Psychology

## 2020-09-16 DIAGNOSIS — F3289 Other specified depressive episodes: Secondary | ICD-10-CM | POA: Diagnosis not present

## 2020-09-17 ENCOUNTER — Ambulatory Visit (INDEPENDENT_AMBULATORY_CARE_PROVIDER_SITE_OTHER): Payer: No Typology Code available for payment source | Admitting: Family Medicine

## 2020-09-17 ENCOUNTER — Encounter (INDEPENDENT_AMBULATORY_CARE_PROVIDER_SITE_OTHER): Payer: Self-pay | Admitting: Family Medicine

## 2020-09-17 ENCOUNTER — Other Ambulatory Visit (HOSPITAL_COMMUNITY): Payer: Self-pay | Admitting: Family Medicine

## 2020-09-17 ENCOUNTER — Other Ambulatory Visit: Payer: Self-pay

## 2020-09-17 VITALS — BP 120/71 | HR 71 | Temp 98.0°F | Ht 69.0 in | Wt 236.0 lb

## 2020-09-17 DIAGNOSIS — Z9189 Other specified personal risk factors, not elsewhere classified: Secondary | ICD-10-CM

## 2020-09-17 DIAGNOSIS — E669 Obesity, unspecified: Secondary | ICD-10-CM | POA: Diagnosis not present

## 2020-09-17 DIAGNOSIS — E559 Vitamin D deficiency, unspecified: Secondary | ICD-10-CM

## 2020-09-17 DIAGNOSIS — E8881 Metabolic syndrome: Secondary | ICD-10-CM

## 2020-09-17 DIAGNOSIS — E88819 Insulin resistance, unspecified: Secondary | ICD-10-CM

## 2020-09-17 DIAGNOSIS — Z6834 Body mass index (BMI) 34.0-34.9, adult: Secondary | ICD-10-CM

## 2020-09-17 MED ORDER — METFORMIN HCL 500 MG PO TABS: 500.0000 mg | ORAL_TABLET | Freq: Every day | ORAL | 0 refills | Status: DC

## 2020-09-17 MED ORDER — VITAMIN D3 1.25 MG (50000 UT) PO CAPS: 50000.0000 [IU] | ORAL_CAPSULE | ORAL | 0 refills | Status: DC

## 2020-09-17 MED FILL — METFORMIN HCL 500 MG TABS: 500 | 30 days supply | Qty: 30 | Fill #0

## 2020-09-18 ENCOUNTER — Encounter (INDEPENDENT_AMBULATORY_CARE_PROVIDER_SITE_OTHER): Payer: Self-pay | Admitting: Family Medicine

## 2020-09-18 NOTE — Progress Notes (Signed)
Chief Complaint:   OBESITY Brian Obrien is here to discuss his progress with his obesity treatment plan along with follow-up of his obesity related diagnoses. Brian Obrien is on following a lower carbohydrate, vegetable and lean protein rich diet plan and states he is following his eating plan approximately 70% of the time. Brian Obrien states he is weight lifting and jogging for 1.5 hours 3-4 times per week.  Today's visit was #: 6 Starting weight: 242 lbs Starting date: 06/25/2020 Today's weight: 236 lbs Today's date: 09/17/2020 Total lbs lost to date: 6 Total lbs lost since last in-office visit: 2  Interim History: Brian Obrien says he has been eating homemade bread about 2 times per week at his parents' house because it is so tempting to him. He feels the low carb is easy to follow. He is keeping up with water and vegetable intake. He is still working with a trainer a few times per week.  Subjective:   1. Insulin resistance Brian Obrien notes that he does not have issues with hunger or cravings since he started the low carbohydrate plan. He feels the metformin is working well for hunger.   Lab Results  Component Value Date   INSULIN 29.3 (H) 06/25/2020   Lab Results  Component Value Date   HGBA1C 5.4 06/25/2020   2. Vitamin D deficiency Brian Obrien's Vit D level is low at 31.1. He is on weekly prescription Vit D.  3. At risk for dehydration Brian Obrien is at risk for dehydration due to following the low carbohydrate plan.  Assessment/Plan:   1. Insulin resistance Brian Obrien will continue to work on weight loss, exercise, and decreasing simple carbohydrates to help decrease the risk of diabetes. Brian Obrien agreed to follow-up with [place] as directed to closely monitor his progress.  - metFORMIN (GLUCOPHAGE) 500 MG tablet; Take 1 tablet (500 mg total) by mouth daily with breakfast.  Dispense: 30 tablet; Refill: 0  2. Vitamin D deficiency . He agrees to continue to take prescription Vitamin D @50 ,000 IU every  week and will follow-up for routine testing of Vitamin D, at least 2-3 times per year to avoid over-replacement.  - Cholecalciferol (VITAMIN D3) 1.25 MG (50000 UT) CAPS; Take 50,000 Int'l Units by mouth once a week.  Dispense: 4 capsule; Refill: 0  3. At risk for dehydration Brian Obrien was given approximately 15 minutes dehydration prevention counseling today. Brian Obrien is at risk for dehydration due to weight loss and current medication(s). He was encouraged to hydrate and monitor fluid status to avoid dehydration as well as weight loss plateaus.   4. Class 1 obesity with serious comorbidity and body mass index (BMI) of 34.0 to 34.9 in adult, unspecified obesity type Brian Obrien is currently in the action stage of change. As such, his goal is to continue with weight loss efforts. He has agreed to following a lower carbohydrate, vegetable and lean protein rich diet plan.   Exercise goals: As is.  Behavioral modification strategies: decreasing simple carbohydrates.  Brian Obrien has agreed to follow-up with our clinic in 3 weeks. He was informed of the importance of frequent follow-up visits to maximize his success with intensive lifestyle modifications for his multiple health conditions.   Objective:   Blood pressure 120/71, pulse 71, temperature 98 F (36.7 C), height 5\' 9"  (1.753 m), weight 236 lb (107 kg), SpO2 96 %. Body mass index is 34.85 kg/m.  General: Cooperative, alert, well developed, in no acute distress. HEENT: Conjunctivae and lids unremarkable. Cardiovascular: Regular rhythm.  Lungs: Normal work of  breathing. Neurologic: No focal deficits.   Lab Results  Component Value Date   CREATININE 0.88 03/19/2020   BUN 14 03/19/2020   NA 142 03/19/2020   K 4.1 03/19/2020   CL 100 03/19/2020   CO2 26 03/19/2020   No results found for: ALT, AST, GGT, ALKPHOS, BILITOT Lab Results  Component Value Date   HGBA1C 5.4 06/25/2020   Lab Results  Component Value Date   INSULIN 29.3 (H)  06/25/2020   Lab Results  Component Value Date   TSH 3.220 03/19/2020   Lab Results  Component Value Date   CHOL 161 03/19/2020   HDL 35 (L) 03/19/2020   LDLCALC 96 03/19/2020   TRIG 175 (H) 03/19/2020   CHOLHDL 4.6 03/19/2020   No results found for: WBC, HGB, HCT, MCV, PLT No results found for: IRON, TIBC, FERRITIN  Attestation Statements:   Reviewed by clinician on day of visit: allergies, medications, problem list, medical history, surgical history, family history, social history, and previous encounter notes.   Trude Mcburney, am acting as Energy manager for [place], FNP-C.  I have reviewed the above documentation for accuracy and completeness, and I agree with the above. -  Jesse Sans, FNP

## 2020-09-23 NOTE — Progress Notes (Signed)
Office: [phone]  /  Fax: [phone]    Date: October 07, 2020   Appointment Start Time: 12:32pm Duration: 19 minutes Provider: Lawerance Cruel, Psy.D. Type of Session: Individual Therapy  Location of Patient: Parked in car in driveway at home Location of Provider: Provider's Home Type of Contact: Telepsychological Visit via MyChart Video Visit/Telephone call  Session Content: This provider called Brian Obrien at 12:29pm as he was observed leaving and rejoining the MyChart Video Visit. He explained he was receiving an error very time he joined; assistance was provided. Today's appointment was switched to a regular telephone call at 12:32pm with Brian Obrien verbal consent due to technical issues. Brian Obrien is a 34 y.o. male presenting for a follow-up appointment to address the previously established treatment goal of increasing coping skills. Today's appointment was a telepsychological visit due to COVID-19. Brian Obrien provided verbal consent for today's telepsychological appointment and he is aware he is responsible for securing confidentiality on his end of the session. Prior to proceeding with today's appointment, Brian Obrien's physical location at the time of this appointment was obtained as well a phone number he could be reached at in the event of technical difficulties. Brian Obrien and this provider participated in today's telepsychological service.   This provider conducted a brief check-in. Brian Obrien reported, "Everything has been the same." He continues to follow his meal plan and engage in physical activity, adding, "I have more energy than I used to." He further shared mindfulness exercises are "helping." Positive reinforcement was provided. Moreover, psychoeducation regarding making better choices and engaging in portion control during the holidays/celebrations was provided. More specifically, this provider discussed the following strategies: coming to meals hungry, but not starving; managing portion  sizes; not completely depriving yourself; making the plate colorful (e.g., vegetables); pacing yourself (e.g., waiting 10 minutes before going back for seconds); taking advantage of the nutritious foods; practicing mindfulness; staying hydrated; and avoid bringing home leftovers. Brian Obrien was receptive to today's appointment as evidenced by openness to sharing, responsiveness to feedback, and willingness to continue engaging in learned skills.  Mental Status Examination:  Appearance: unable to assess  Behavior: unable to assess Mood: euthymic Affect: unable to fully assess Speech: normal in rate, volume, and tone Eye Contact: unable to assess Psychomotor Activity: unable to assess  Gait: unable to assess Thought Process: linear, logical, and goal directed  Thought Content/Perception: no hallucinations, delusions, bizarre thinking or behavior reported or observed and no evidence of suicidal and homicidal ideation, plan, and intent Orientation: time, person, place, and purpose of appointment Memory/Concentration: memory, attention, language, and fund of knowledge intact  Insight/Judgment: good  Interventions:  Conducted a brief chart review Provided empathic reflections and validation Provided positive reinforcement Employed supportive psychotherapy interventions to facilitate reduced distress and to improve coping skills with identified stressors Discussed strategies for holidays/celebrations  DSM-5 Diagnosis(es): 311 (F32.8) Other Specified Depressive Disorder, Emotional Eating Behaviors  Treatment Goal & Progress: During the initial appointment with this provider, the following treatment goal was established: increase coping skills. Brian Obrien demonstrated progress in his goal as evidenced by increased awareness of hunger patterns, increased awareness of triggers for emotional eating and reduction in emotional eating. Brian Obrien also continues to demonstrate willingness to engage in learned  skill(s).  Plan: Today was Brian Obrien's last appointment with this provider. He acknowledged understanding that he may request a follow-up appointment with this provider in the future (following this provider's maternity leave as previously discussed) as long as he is still established with the clinic. No further follow-up planned by this  provider.

## 2020-09-26 MED FILL — METFORMIN HCL 500 MG TABS: 500 | 30 days supply | Qty: 30 | Fill #0

## 2020-09-26 MED FILL — VIT D3-50 50,000 UNITS CAPS: 1.25 MG | 28 days supply | Qty: 4 | Fill #0

## 2020-10-07 ENCOUNTER — Encounter (INDEPENDENT_AMBULATORY_CARE_PROVIDER_SITE_OTHER): Payer: Self-pay | Admitting: Family Medicine

## 2020-10-07 ENCOUNTER — Telehealth (INDEPENDENT_AMBULATORY_CARE_PROVIDER_SITE_OTHER): Payer: No Typology Code available for payment source | Admitting: Psychology

## 2020-10-07 ENCOUNTER — Ambulatory Visit (INDEPENDENT_AMBULATORY_CARE_PROVIDER_SITE_OTHER): Payer: No Typology Code available for payment source | Admitting: Family Medicine

## 2020-10-07 ENCOUNTER — Other Ambulatory Visit: Payer: Self-pay

## 2020-10-07 VITALS — BP 114/69 | HR 66 | Temp 98.1°F | Ht 69.0 in | Wt 231.0 lb

## 2020-10-07 DIAGNOSIS — Z6834 Body mass index (BMI) 34.0-34.9, adult: Secondary | ICD-10-CM | POA: Diagnosis not present

## 2020-10-07 DIAGNOSIS — E8881 Metabolic syndrome: Secondary | ICD-10-CM

## 2020-10-07 DIAGNOSIS — F3289 Other specified depressive episodes: Secondary | ICD-10-CM | POA: Diagnosis not present

## 2020-10-07 DIAGNOSIS — E669 Obesity, unspecified: Secondary | ICD-10-CM | POA: Diagnosis not present

## 2020-10-09 NOTE — Progress Notes (Signed)
Chief Complaint:   OBESITY Brian Obrien is here to discuss his progress with his obesity treatment plan along with follow-up of his obesity related diagnoses. Brian Obrien is on following a lower carbohydrate, vegetable and lean protein rich diet plan and states he is following his eating plan approximately 80% of the time. Brian Obrien states he is doing cardio and weights for 60-120 minutes 3 times per week.  Today's visit was #: 7 Starting weight: 242 lbs Starting date: 06/25/2020 Today's weight: 231 lbs Today's date: 10/07/2020 Total lbs lost to date: 11 Total lbs lost since last in-office visit: 5  Interim History: Brian Obrien is down 5 lbs today and a total of 16 lbs lost since 06/25/2020. He is doing exceptionally well on the low carbohydrate plan. His appetite is satisfied. He notes more energy overall. He is excited by the exercise he has been doing with a trainer. He likes the low carbohydrates and does not feel deprived. He has increased his water intake. He does occasionally have a serving of homemade bread.   Subjective:   1. Insulin resistance Brian Obrien denies polyphagia, and he is on metformin 500 mg. He feels it is working well.   Lab Results  Component Value Date   INSULIN 29.3 (H) 06/25/2020   Lab Results  Component Value Date   HGBA1C 5.4 06/25/2020   Assessment/Plan:   1. Insulin resistance Brian Obrien will continue his meal plan, and will continue to work on weight loss, exercise, and decreasing simple carbohydrates to help decrease the risk of diabetes.  2. Class 1 obesity with serious comorbidity and body mass index (BMI) of 34.0 to 34.9 in adult, unspecified obesity type Brian Obrien is currently in the action stage of change. As such, his goal is to continue with weight loss efforts. He has agreed to following a lower carbohydrate, vegetable and lean protein rich diet plan.   Exercise goals: As is.  Behavioral modification strategies: decreasing simple carbohydrates.  Brian Obrien  has agreed to follow-up with our clinic in 3 weeks.   Objective:   Blood pressure 114/69, pulse 66, temperature 98.1 F (36.7 C), height 5\' 9"  (1.753 m), weight 231 lb (104.8 kg), SpO2 98 %. Body mass index is 34.11 kg/m.  General: Cooperative, alert, well developed, in no acute distress. HEENT: Conjunctivae and lids unremarkable. Cardiovascular: Regular rhythm.  Lungs: Normal work of breathing. Neurologic: No focal deficits.   Lab Results  Component Value Date   CREATININE 0.88 03/19/2020   BUN 14 03/19/2020   NA 142 03/19/2020   K 4.1 03/19/2020   CL 100 03/19/2020   CO2 26 03/19/2020   No results found for: ALT, AST, GGT, ALKPHOS, BILITOT Lab Results  Component Value Date   HGBA1C 5.4 06/25/2020   Lab Results  Component Value Date   INSULIN 29.3 (H) 06/25/2020   Lab Results  Component Value Date   TSH 3.220 03/19/2020   Lab Results  Component Value Date   CHOL 161 03/19/2020   HDL 35 (L) 03/19/2020   LDLCALC 96 03/19/2020   TRIG 175 (H) 03/19/2020   CHOLHDL 4.6 03/19/2020   No results found for: WBC, HGB, HCT, MCV, PLT No results found for: IRON, TIBC, FERRITIN  Attestation Statements:   Reviewed by clinician on day of visit: allergies, medications, problem list, medical history, surgical history, family history, social history, and previous encounter notes.   03/21/2020, am acting as Trude Mcburney for Energy manager, FNP-C.  I have reviewed the above documentation for accuracy and  completeness, and I agree with the above. -  Georgianne Fick, FNP

## 2020-10-13 ENCOUNTER — Encounter (INDEPENDENT_AMBULATORY_CARE_PROVIDER_SITE_OTHER): Payer: Self-pay | Admitting: Family Medicine

## 2020-10-28 ENCOUNTER — Other Ambulatory Visit (INDEPENDENT_AMBULATORY_CARE_PROVIDER_SITE_OTHER): Payer: Self-pay | Admitting: Adult Health

## 2020-10-28 ENCOUNTER — Telehealth (INDEPENDENT_AMBULATORY_CARE_PROVIDER_SITE_OTHER): Payer: Self-pay

## 2020-10-28 ENCOUNTER — Ambulatory Visit (INDEPENDENT_AMBULATORY_CARE_PROVIDER_SITE_OTHER): Payer: No Typology Code available for payment source | Admitting: Family Medicine

## 2020-10-28 DIAGNOSIS — E559 Vitamin D deficiency, unspecified: Secondary | ICD-10-CM

## 2020-10-28 DIAGNOSIS — E8881 Metabolic syndrome: Secondary | ICD-10-CM

## 2020-10-28 MED ORDER — VITAMIN D3 1.25 MG (50000 UT) PO CAPS: 50000.0000 [IU] | ORAL_CAPSULE | ORAL | 0 refills | Status: DC

## 2020-10-28 MED ORDER — METFORMIN HCL 500 MG PO TABS: 500.0000 mg | ORAL_TABLET | Freq: Every day | ORAL | 0 refills | Status: DC

## 2020-10-28 NOTE — Telephone Encounter (Signed)
This patient was last seen by Dawn Whitmire, FNP, and currently has an upcoming appt scheduled on 11/04/20 with her.  

## 2020-10-28 NOTE — Telephone Encounter (Signed)
Rx's sent in. -Fernande Treiber

## 2020-10-28 NOTE — Telephone Encounter (Signed)
Pt requesting rx refills. Pt was rescheduled due to [facility] being out of the office today.

## 2020-10-28 NOTE — Telephone Encounter (Signed)
Patient needs refills on Metformin and Vit D.  Was resheduled due to provider out of office.

## 2020-11-04 ENCOUNTER — Encounter (INDEPENDENT_AMBULATORY_CARE_PROVIDER_SITE_OTHER): Payer: Self-pay | Admitting: Family Medicine

## 2020-11-04 ENCOUNTER — Other Ambulatory Visit: Payer: Self-pay

## 2020-11-04 ENCOUNTER — Other Ambulatory Visit (INDEPENDENT_AMBULATORY_CARE_PROVIDER_SITE_OTHER): Payer: Self-pay | Admitting: Family Medicine

## 2020-11-04 ENCOUNTER — Ambulatory Visit (INDEPENDENT_AMBULATORY_CARE_PROVIDER_SITE_OTHER): Payer: No Typology Code available for payment source | Admitting: Family Medicine

## 2020-11-04 VITALS — BP 116/66 | HR 75 | Temp 97.9°F | Ht 69.0 in | Wt 223.0 lb

## 2020-11-04 DIAGNOSIS — E669 Obesity, unspecified: Secondary | ICD-10-CM

## 2020-11-04 DIAGNOSIS — E88819 Insulin resistance, unspecified: Secondary | ICD-10-CM

## 2020-11-04 DIAGNOSIS — E7849 Other hyperlipidemia: Secondary | ICD-10-CM | POA: Diagnosis not present

## 2020-11-04 DIAGNOSIS — E8881 Metabolic syndrome: Secondary | ICD-10-CM

## 2020-11-04 DIAGNOSIS — Z9189 Other specified personal risk factors, not elsewhere classified: Secondary | ICD-10-CM | POA: Diagnosis not present

## 2020-11-04 DIAGNOSIS — Z6832 Body mass index (BMI) 32.0-32.9, adult: Secondary | ICD-10-CM

## 2020-11-04 DIAGNOSIS — E559 Vitamin D deficiency, unspecified: Secondary | ICD-10-CM

## 2020-11-04 MED ORDER — METFORMIN HCL 500 MG PO TABS
500.0000 mg | ORAL_TABLET | Freq: Every day | ORAL | 0 refills | Status: DC
Start: 2020-11-04 — End: 2020-11-25

## 2020-11-04 MED FILL — METFORMIN HCL 500 MG TABS: 500 | 30 days supply | Qty: 30 | Fill #0

## 2020-11-04 NOTE — Progress Notes (Signed)
Chief Complaint:   OBESITY Brian Obrien is here to discuss his progress with his obesity treatment plan along with follow-up of his obesity related diagnoses. Brian Obrien is on following a lower carbohydrate, vegetable and lean protein rich diet plan and states he is following his eating plan approximately 80% of the time. Brian Obrien states he is lifting weights and doing cardio for 60-120 minutes 3 times per week.  Today's visit was #: 8 Starting weight: 242 lbs Starting date: 06/25/2020 Today's weight: 223 lbs Today's date: 11/04/2020 Total lbs lost to date: 19 Total lbs lost since last in-office visit: 8  Interim History: Brian Obrien is sticking to the low carbohydrate plan and he likes it very much. He does cheat a few times per week. He is down 24 lbs since starting on 06/25/20. He is still working with a trainer 3 times per week. He denies excessive hunger. He is being creative with his meal prep to add variety.  Subjective:   1. Other hyperlipidemia Brian Obrien's LDL was normal at 96, triglycerides at 175, and his HDL was low at 35. He is not on statin. Hyperlipidemia runs in his family. He takes Omega 3 fatty acids daily.  No results found for: ALT, AST, GGT, ALKPHOS, BILITOT Lab Results  Component Value Date   CHOL 161 03/19/2020   HDL 35 (L) 03/19/2020   LDLCALC 96 03/19/2020   TRIG 175 (H) 03/19/2020   CHOLHDL 4.6 03/19/2020   2. Insulin resistance Brian Obrien's last A1c was 5.4. He is on metformin 500 mg q AM, and he denies diarrhea or nausea. He feels it helps with hunger.  He continues to work on diet and exercise to decrease his risk of diabetes.  Lab Results  Component Value Date   INSULIN 29.3 (H) 06/25/2020   Lab Results  Component Value Date   HGBA1C 5.4 06/25/2020   3. Vitamin D deficiency Brian Obrien's last Vit D level was 31.1 on 06/25/2020. He is on weekly prescription Vit D.  4. At risk for heart disease Brian Obrien is at a higher than average risk for cardiovascular disease due  to obesity, family history, and insulin resistance.   Assessment/Plan:   1. Other hyperlipidemia Cardiovascular risk and specific lipid/LDL goals reviewed. We discussed several lifestyle modifications today.We will recheck labs today.  - Lipid Panel With LDL/HDL Ratio  2. Insulin resistance  We will check labs today, and we will refill metformin for 1 month. Brian Obrien agreed to follow-up with [place] as directed to closely monitor his progress.  - Hemoglobin A1c - Insulin, random - metFORMIN (GLUCOPHAGE) 500 MG tablet; Take 1 tablet (500 mg total) by mouth daily with breakfast.  Dispense: 30 tablet; Refill: 0  3. Vitamin D deficiency  We will check labs today. Brian Obrien agreed to continue taking prescription Vitamin D 50,000 IU every week.  - VITAMIN D 25 Hydroxy (Vit-D Deficiency, Fractures)  4. At risk for heart disease Brian Obrien was given approximately 15 minutes of coronary artery disease prevention counseling today. He is 34 y.o. male and has risk factors for heart disease including obesity. We discussed intensive lifestyle modifications today with an emphasis on specific weight loss instructions and strategies.   Repetitive spaced learning was employed today to elicit superior memory formation and behavioral change.  5. Class 1 obesity with serious comorbidity and body mass index (BMI) of 32.0 to 32.9 in adult, unspecified obesity type Brian Obrien is currently in the action stage of change. As such, his goal is to continue with weight loss  efforts. He has agreed to following a lower carbohydrate, vegetable and lean protein rich diet plan.   Exercise goals: As is.  Behavioral modification strategies: planning for success.  Brian Obrien has agreed to follow-up with our clinic in 3 weeks. Brian Obrien was informed we would discuss his lab results at his next visit unless there is a critical issue that needs to be addressed sooner. Brian Obrien agreed to keep his next visit at the agreed upon time to discuss  these results.  Objective:   Blood pressure 116/66, pulse 75, temperature 97.9 F (36.6 C), height 5\' 9"  (1.753 m), weight 223 lb (101.2 kg), SpO2 98 %. Body mass index is 32.93 kg/m.  General: Cooperative, alert, well developed, in no acute distress. HEENT: Conjunctivae and lids unremarkable. Cardiovascular: Regular rhythm.  Lungs: Normal work of breathing. Neurologic: No focal deficits.   Lab Results  Component Value Date   CREATININE 0.88 03/19/2020   BUN 14 03/19/2020   NA 142 03/19/2020   K 4.1 03/19/2020   CL 100 03/19/2020   CO2 26 03/19/2020   No results found for: ALT, AST, GGT, ALKPHOS, BILITOT Lab Results  Component Value Date   HGBA1C 5.4 06/25/2020   Lab Results  Component Value Date   INSULIN 29.3 (H) 06/25/2020   Lab Results  Component Value Date   TSH 3.220 03/19/2020   Lab Results  Component Value Date   CHOL 161 03/19/2020   HDL 35 (L) 03/19/2020   LDLCALC 96 03/19/2020   TRIG 175 (H) 03/19/2020   CHOLHDL 4.6 03/19/2020   No results found for: WBC, HGB, HCT, MCV, PLT No results found for: IRON, TIBC, FERRITIN  Attestation Statements:   Reviewed by clinician on day of visit: allergies, medications, problem list, medical history, surgical history, family history, social history, and previous encounter notes.   03/21/2020, am acting as Trude Mcburney for Energy manager, FNP-C.  I have reviewed the above documentation for accuracy and completeness, and I agree with the above. -  [place], FNP

## 2020-11-05 ENCOUNTER — Encounter (INDEPENDENT_AMBULATORY_CARE_PROVIDER_SITE_OTHER): Payer: Self-pay | Admitting: Family Medicine

## 2020-11-05 LAB — HEMOGLOBIN A1C
Est. average glucose Bld gHb Est-mCnc: 103 mg/dL
Hgb A1c MFr Bld: 5.2 % (ref 4.8–5.6)

## 2020-11-05 LAB — LIPID PANEL WITH LDL/HDL RATIO
Cholesterol, Total: 196 mg/dL (ref 100–199)
HDL: 35 mg/dL — ABNORMAL LOW (ref >39)
LDL Chol Calc (NIH): 131 mg/dL — ABNORMAL HIGH (ref 0–99)
LDL/HDL Ratio: 3.7 ratio — ABNORMAL HIGH (ref 0.0–3.6)
Triglycerides: 167 mg/dL — ABNORMAL HIGH (ref 0–149)
VLDL Cholesterol Cal: 30 mg/dL (ref 5–40)

## 2020-11-05 LAB — INSULIN, RANDOM: INSULIN: 17.6 u[IU]/mL (ref 2.6–24.9)

## 2020-11-05 LAB — VITAMIN D 25 HYDROXY (VIT D DEFICIENCY, FRACTURES): Vit D, 25-Hydroxy: 65.9 ng/mL (ref 30.0–100.0)

## 2020-11-25 ENCOUNTER — Encounter (INDEPENDENT_AMBULATORY_CARE_PROVIDER_SITE_OTHER): Payer: Self-pay | Admitting: Family Medicine

## 2020-11-25 ENCOUNTER — Other Ambulatory Visit: Payer: Self-pay

## 2020-11-25 ENCOUNTER — Ambulatory Visit (INDEPENDENT_AMBULATORY_CARE_PROVIDER_SITE_OTHER): Payer: No Typology Code available for payment source | Admitting: Family Medicine

## 2020-11-25 VITALS — BP 109/67 | HR 80 | Temp 98.4°F | Ht 69.0 in | Wt 220.0 lb

## 2020-11-25 DIAGNOSIS — E66811 Obesity, class 1: Secondary | ICD-10-CM

## 2020-11-25 DIAGNOSIS — E669 Obesity, unspecified: Secondary | ICD-10-CM | POA: Diagnosis not present

## 2020-11-25 DIAGNOSIS — E8881 Metabolic syndrome: Secondary | ICD-10-CM

## 2020-11-25 DIAGNOSIS — J302 Other seasonal allergic rhinitis: Secondary | ICD-10-CM | POA: Diagnosis not present

## 2020-11-25 DIAGNOSIS — Z9189 Other specified personal risk factors, not elsewhere classified: Secondary | ICD-10-CM | POA: Diagnosis not present

## 2020-11-25 DIAGNOSIS — E559 Vitamin D deficiency, unspecified: Secondary | ICD-10-CM

## 2020-11-25 DIAGNOSIS — Z6832 Body mass index (BMI) 32.0-32.9, adult: Secondary | ICD-10-CM

## 2020-11-25 DIAGNOSIS — E88819 Insulin resistance, unspecified: Secondary | ICD-10-CM

## 2020-11-25 MED ORDER — METFORMIN HCL 500 MG PO TABS
500.0000 mg | ORAL_TABLET | Freq: Every day | ORAL | 0 refills | Status: DC
Start: 2020-11-25 — End: 2020-11-26

## 2020-11-25 MED ORDER — AZELASTINE HCL 0.1 % NA SOLN: 2.0000 | Freq: Two times a day (BID) | NASAL | 2 refills | Status: DC

## 2020-11-25 MED ORDER — VITAMIN D 125 MCG (5000 UT) PO CAPS: 1.0000 | ORAL_CAPSULE | Freq: Every day | ORAL | 0 refills | Status: DC

## 2020-11-26 ENCOUNTER — Other Ambulatory Visit (INDEPENDENT_AMBULATORY_CARE_PROVIDER_SITE_OTHER): Payer: Self-pay | Admitting: Family Medicine

## 2020-11-26 MED ORDER — AZELASTINE HCL 0.1 % NA SOLN: 2.0000 | Freq: Two times a day (BID) | NASAL | 2 refills | Status: DC

## 2020-11-26 MED ORDER — METFORMIN HCL 500 MG PO TABS
500.0000 mg | ORAL_TABLET | Freq: Every day | ORAL | 0 refills | Status: DC
Start: 2020-11-26 — End: 2021-01-06

## 2020-11-26 MED FILL — AZELASTINE HCL 137 MCG SPRY: 0.1 | 30 days supply | Qty: 30 | Fill #0

## 2020-11-26 NOTE — Telephone Encounter (Signed)
This was sent to me.

## 2020-11-27 ENCOUNTER — Encounter (INDEPENDENT_AMBULATORY_CARE_PROVIDER_SITE_OTHER): Payer: Self-pay | Admitting: Family Medicine

## 2020-11-27 DIAGNOSIS — J302 Other seasonal allergic rhinitis: Secondary | ICD-10-CM | POA: Insufficient documentation

## 2020-11-27 NOTE — Progress Notes (Addendum)
Chief Complaint:   OBESITY Brian Obrien is here to discuss his progress with his obesity treatment plan along with follow-up of his obesity related diagnoses. Brian Obrien is on following a lower carbohydrate, vegetable and lean protein rich diet plan and states he is following his eating plan approximately 80% of the time. Brian Obrien states he is weight training and cardio 135 minutes 3 times per week.  Today's visit was #: 9 Starting weight: 242 lbs Starting date: 06/25/2020 Today's weight: 220 lbs Today's date: 11/25/2020 Total lbs lost to date: 22 lbs Total lbs lost since last in-office visit: 3 lbs  Interim History: Brian Obrien has been intermittent fasting 3 days per week (fasts 16 hours and eats during 8 hr period).He is getting in plenty of protein and has only 2 meals on these days. Brian Obrien occasionally pushes it to 18 fasting and 6 eating. He is on low carb plan.  Subjective:   1. Seasonal allergies Brian Obrien takes Claritin when pollen is bad and Astelin all year. Astelin works well to relieve nasal congestion.  2. Vitamin D deficiency Brian Obrien's Vitamin D level was 65.9 on 11/04/2020. He is currently taking prescription vitamin D 50,000 IU each week.  Vitamin D is at goal (65.9). Brian Obrien has been on weekly Vitamin D 95188 IU.  3. Insulin resistance Brian Obrien has a diagnosis of insulin resistance based on his elevated fasting insulin level >5. . A1c and fasting insulin have both improved. He is on metformin 500 mg daily and feels it helps decrease appetite.  Lab Results  Component Value Date   INSULIN 17.6 11/04/2020   INSULIN 29.3 (H) 06/25/2020   Lab Results  Component Value Date   HGBA1C 5.2 11/04/2020    4. At risk for heart disease Brian Obrien is at a higher than average risk for cardiovascular disease due to obesity, family history of HLD, high LDL/ triglycerides, and low HDL.   Assessment/Plan:   1. Seasonal allergies Will fill prescription for Astelin 0.1% 2 sprays each nostril  BID.  zelastine (ASTELIN) 0.1 % nasal spray; Place 2 sprays into both nostrils 2 (two) times daily. Use in each nostril as directed  Dispense: 30 mL; Refill: 2  2. Vitamin D deficiency Discontinue prescription vit d and start 5000 vitamin D3 over the counter daily.  3. Insulin resistance  Will fill metformin 500 mg. Taken daily at breakfast #30 refill 0.  - metFORMIN (GLUCOPHAGE) 500 MG tablet; Take 1 tablet (500 mg total) by mouth daily with breakfast.  Dispense: 30 tablet; Refill: 0  4. At risk for heart disease Brian Obrien was given approximately 15 minutes of coronary artery disease prevention counseling today. He is 35 y.o. male and has risk factors for heart disease including obesity. We discussed intensive lifestyle modifications today with an emphasis on specific weight loss instructions and strategies.   Repetitive spaced learning was employed today to elicit superior memory formation and behavioral change.  5. Class 1 obesity with serious comorbidity and body mass index (BMI) of 32.0 to 32.9 in adult, unspecified obesity type  Brian Obrien is currently in the action stage of change. As such, his goal is to continue with weight loss efforts. He has agreed to following a lower carbohydrate, vegetable and lean protein rich diet plan.   Exercise goals: As is.  Behavioral modification strategies: planning for success.  Brian Obrien has agreed to follow-up with our clinic in 3 weeks.   Objective:   Blood pressure 109/67, pulse 80, temperature 98.4 F (36.9 C), height 5\' 9"  (  1.753 m), weight 220 lb (99.8 kg), SpO2 96 %. Body mass index is 32.49 kg/m.  General: Cooperative, alert, well developed, in no acute distress. HEENT: Conjunctivae and lids unremarkable. Cardiovascular: Regular rhythm.  Lungs: Normal work of breathing. Neurologic: No focal deficits.   Lab Results  Component Value Date   CREATININE 0.88 03/19/2020   BUN 14 03/19/2020   NA 142 03/19/2020   K 4.1 03/19/2020   CL  100 03/19/2020   CO2 26 03/19/2020   No results found for: ALT, AST, GGT, ALKPHOS, BILITOT Lab Results  Component Value Date   HGBA1C 5.2 11/04/2020   HGBA1C 5.4 06/25/2020   Lab Results  Component Value Date   INSULIN 17.6 11/04/2020   INSULIN 29.3 (H) 06/25/2020   Lab Results  Component Value Date   TSH 3.220 03/19/2020   Lab Results  Component Value Date   CHOL 196 11/04/2020   HDL 35 (L) 11/04/2020   LDLCALC 131 (H) 11/04/2020   TRIG 167 (H) 11/04/2020   CHOLHDL 4.6 03/19/2020   No results found for: WBC, HGB, HCT, MCV, PLT No results found for: IRON, TIBC, FERRITIN  I, Brian Obrien, am acting as Energy manager for Brian Sans, FNP.  I have reviewed the above documentation for accuracy and completeness, and I agree with the above. -  Brian Sans, FNP

## 2020-11-28 MED FILL — METFORMIN HCL 500 MG TABS: 500 | 30 days supply | Qty: 30 | Fill #0

## 2020-12-15 MED FILL — AZELASTINE HCL 137 MCG SPRY: 0.1 | 25 days supply | Qty: 30 | Fill #0

## 2020-12-15 MED FILL — METFORMIN HCL 500 MG TABS: 500 | 30 days supply | Qty: 30 | Fill #0

## 2020-12-16 ENCOUNTER — Other Ambulatory Visit: Payer: Self-pay

## 2020-12-16 ENCOUNTER — Telehealth (INDEPENDENT_AMBULATORY_CARE_PROVIDER_SITE_OTHER): Payer: No Typology Code available for payment source | Admitting: Family Medicine

## 2020-12-16 ENCOUNTER — Encounter (INDEPENDENT_AMBULATORY_CARE_PROVIDER_SITE_OTHER): Payer: Self-pay | Admitting: Family Medicine

## 2020-12-16 ENCOUNTER — Encounter (INDEPENDENT_AMBULATORY_CARE_PROVIDER_SITE_OTHER): Payer: Self-pay

## 2020-12-16 DIAGNOSIS — Z6832 Body mass index (BMI) 32.0-32.9, adult: Secondary | ICD-10-CM

## 2020-12-16 DIAGNOSIS — E8881 Metabolic syndrome: Secondary | ICD-10-CM | POA: Diagnosis not present

## 2020-12-16 DIAGNOSIS — E669 Obesity, unspecified: Secondary | ICD-10-CM

## 2020-12-18 NOTE — Progress Notes (Signed)
TeleHealth Visit:  Due to the COVID-19 pandemic, this visit was completed with telemedicine (audio/video) technology to reduce patient and provider exposure as well as to preserve personal protective equipment.   Betty has verbally consented to this TeleHealth visit. The patient is located at home, the provider is located at the [facility] office. The participants in this visit include the listed provider and patient. Video was attempted, but Remberto was unable to use realtime audiovisual technology today and the telehealth visit was conducted via telephone (13 minute call).   Chief Complaint: OBESITY Christhoper is here to discuss his progress with his obesity treatment plan along with follow-up of his obesity related diagnoses. Cuyler is on following a lower carbohydrate, vegetable and lean protein rich diet plan and states he is following his eating plan approximately 0% of the time. Amillion states he is doing cardio and weights for 60 minutes 3 times per week.  Today's visit was #: 10 Starting weight: 242 lbs Starting date: 06/25/2020  Interim History: Zain has done well on our plan and he is down about 22 lbs. He reports losing 2 lbs since his last office visit. He was ill a few weeks ago, but he is now back on the plan.  He preps his meals on his days off. He works weekends at [employer] and as needed on the weekends at [employer]. His vegetable and water intake is good, and he denies constipation.  Subjective:   1. Insulin resistance Findley denies side effects of metformin, and he is on 500 mg daily. He says he has no cravings and does not snack much between meals. He continues to work on diet and exercise to decrease his risk of diabetes.  Lab Results  Component Value Date   INSULIN 17.6 11/04/2020   INSULIN 29.3 (H) 06/25/2020   Lab Results  Component Value Date   HGBA1C 5.2 11/04/2020   Assessment/Plan:   1. Insulin resistance Mckinnon will continue metformin We  discussed that gabapentin can cause weight gain. He says he takes it rarely.   2. Class 1 obesity with serious comorbidity and body mass index (BMI) of 32.0 to 32.9 in adult, unspecified obesity type Isiaih is currently in the action stage of change. As such, his goal is to continue with weight loss efforts. He has agreed to following a lower carbohydrate, vegetable and lean protein rich diet plan.   Exercise goals: As is.  Behavioral modification strategies: decreasing simple carbohydrates and planning for success.  Jenkins has agreed to follow-up with our clinic in 2 to 3 weeks. He was informed of the importance of frequent follow-up visits to maximize his success with intensive lifestyle modifications for his multiple health conditions.  Objective:   VITALS: Per patient if applicable, see vitals. GENERAL: Alert and in no acute distress. CARDIOPULMONARY: No increased WOB. Speaking in clear sentences.  PSYCH: Pleasant and cooperative. Speech normal rate and rhythm. Affect is appropriate. Insight and judgement are appropriate. Attention is focused, linear, and appropriate.  NEURO: Oriented as arrived to appointment on time with no prompting.   Lab Results  Component Value Date   CREATININE 0.88 03/19/2020   BUN 14 03/19/2020   NA 142 03/19/2020   K 4.1 03/19/2020   CL 100 03/19/2020   CO2 26 03/19/2020   No results found for: ALT, AST, GGT, ALKPHOS, BILITOT Lab Results  Component Value Date   HGBA1C 5.2 11/04/2020   HGBA1C 5.4 06/25/2020   Lab Results  Component Value  Date   INSULIN 17.6 11/04/2020   INSULIN 29.3 (H) 06/25/2020   Lab Results  Component Value Date   TSH 3.220 03/19/2020   Lab Results  Component Value Date   CHOL 196 11/04/2020   HDL 35 (L) 11/04/2020   LDLCALC 131 (H) 11/04/2020   TRIG 167 (H) 11/04/2020   CHOLHDL 4.6 03/19/2020   No results found for: WBC, HGB, HCT, MCV, PLT No results found for: IRON, TIBC, FERRITIN  Attestation Statements:    Reviewed by clinician on day of visit: allergies, medications, problem list, medical history, surgical history, family history, social history, and previous encounter notes.   Trude Mcburney, am acting as Energy manager for [place], FNP-C.  I have reviewed the above documentation for accuracy and completeness, and I agree with the above. - Jesse Sans, FNP

## 2021-01-06 ENCOUNTER — Encounter (INDEPENDENT_AMBULATORY_CARE_PROVIDER_SITE_OTHER): Payer: Self-pay

## 2021-01-06 ENCOUNTER — Other Ambulatory Visit (HOSPITAL_COMMUNITY): Payer: Self-pay | Admitting: Family Medicine

## 2021-01-06 ENCOUNTER — Encounter (INDEPENDENT_AMBULATORY_CARE_PROVIDER_SITE_OTHER): Payer: Self-pay | Admitting: Family Medicine

## 2021-01-06 ENCOUNTER — Ambulatory Visit (INDEPENDENT_AMBULATORY_CARE_PROVIDER_SITE_OTHER): Payer: No Typology Code available for payment source | Admitting: Family Medicine

## 2021-01-06 ENCOUNTER — Other Ambulatory Visit: Payer: Self-pay

## 2021-01-06 VITALS — BP 106/69 | HR 79 | Temp 97.9°F | Ht 69.0 in | Wt 222.0 lb

## 2021-01-06 DIAGNOSIS — E559 Vitamin D deficiency, unspecified: Secondary | ICD-10-CM

## 2021-01-06 DIAGNOSIS — Z9189 Other specified personal risk factors, not elsewhere classified: Secondary | ICD-10-CM

## 2021-01-06 DIAGNOSIS — Z6832 Body mass index (BMI) 32.0-32.9, adult: Secondary | ICD-10-CM | POA: Diagnosis not present

## 2021-01-06 DIAGNOSIS — E8881 Metabolic syndrome: Secondary | ICD-10-CM | POA: Diagnosis not present

## 2021-01-06 DIAGNOSIS — E669 Obesity, unspecified: Secondary | ICD-10-CM

## 2021-01-06 MED ORDER — METFORMIN HCL 500 MG PO TABS
500.0000 mg | ORAL_TABLET | Freq: Every day | ORAL | 0 refills | Status: DC
Start: 2021-01-06 — End: 2021-01-27

## 2021-01-07 NOTE — Progress Notes (Signed)
Chief Complaint:   OBESITY Brian Obrien is here to discuss his progress with his obesity treatment plan along with follow-up of his obesity related diagnoses. Brian Obrien is on following a lower carbohydrate, vegetable and lean protein rich diet plan and states he is following his eating plan approximately 40% of the time. Brian Obrien states he is weight training and exercising 50-60 minutes 2 times per week.  Today's visit was #: 11 Starting weight: 242 lbs Starting date: 06/25/2020 Today's weight: 222 lbs Today's date: 01/06/2021 Total lbs lost to date: 20 lbs Total lbs lost since last in-office visit: 0  Interim History: Pt voices he has had a hard time following plan for the last few weeks. He also has not been able to workout as much, as his trainer was not available. He was grabbing meals on the go, including lots of rice and bread, over the last few weeks.  Subjective:   1. Insulin resistance Pt's last A1c was 5.2 with an insulin level of 17.6. He is on Metformin daily with no GI side effects.  Lab Results  Component Value Date   INSULIN 17.6 11/04/2020   INSULIN 29.3 (H) 06/25/2020   Lab Results  Component Value Date   HGBA1C 5.2 11/04/2020    2. Vitamin D deficiency Pt denies nausea, vomiting, and muscle weakness but notes fatigue. Pt is on OTC Vit D 5,000 units a day, as last Vit D check was 65.9.  3. At risk for diabetes mellitus Brian Obrien is at higher than average risk for developing diabetes due to obesity.   Assessment/Plan:   1. Insulin resistance Brian Obrien will continue to work on weight loss, exercise, and decreasing simple carbohydrates to help decrease the risk of diabetes. Brian Obrien agreed to follow-up with [place] as directed to closely monitor his progress. - metFORMIN (GLUCOPHAGE) 500 MG tablet; Take 1 tablet (500 mg total) by mouth daily with breakfast.  Dispense: 30 tablet; Refill: 0  2. Vitamin D deficiency Low Vitamin D level contributes to fatigue and are  associated with obesity, breast, and colon cancer. He agrees to continue to take OTC Vitamin D @5 ,000 IU daily and will follow-up for routine testing of Vitamin D, at least 2-3 times per year to avoid over-replacement. Will need repeat labs in 3 months.  3. At risk for diabetes mellitus Brian Obrien was given approximately 15 minutes of diabetes education and counseling today. We discussed intensive lifestyle modifications today with an emphasis on weight loss as well as increasing exercise and decreasing simple carbohydrates in his diet. We also reviewed medication options with an emphasis on risk versus benefit of those discussed.   Repetitive spaced learning was employed today to elicit superior memory formation and behavioral change.  4. Class 1 obesity with serious comorbidity and body mass index (BMI) of 32.0 to 32.9 in adult, unspecified obesity type Brian Obrien is currently in the action stage of change. As such, his goal is to continue with weight loss efforts. He has agreed to following a lower carbohydrate, vegetable and lean protein rich diet plan.   Exercise goals: For substantial health benefits, adults should do at least 150 minutes (2 hours and 30 minutes) a week of moderate-intensity, or 75 minutes (1 hour and 15 minutes) a week of vigorous-intensity aerobic physical activity, or an equivalent combination of moderate- and vigorous-intensity aerobic activity. Aerobic activity should be performed in episodes of at least 10 minutes, and preferably, it should be spread throughout the week.  Behavioral modification strategies: increasing lean protein  intake.  Brian Obrien has agreed to follow-up with our clinic in 2-3 weeks. He was informed of the importance of frequent follow-up visits to maximize his success with intensive lifestyle modifications for his multiple health conditions.   Objective:   Blood pressure 106/69, pulse 79, temperature 97.9 F (36.6 C), temperature source Oral, height 5\' 9"   (1.753 m), weight 222 lb (100.7 kg), SpO2 96 %. Body mass index is 32.78 kg/m.  General: Cooperative, alert, well developed, in no acute distress. HEENT: Conjunctivae and lids unremarkable. Cardiovascular: Regular rhythm.  Lungs: Normal work of breathing. Neurologic: No focal deficits.   Lab Results  Component Value Date   CREATININE 0.88 03/19/2020   BUN 14 03/19/2020   NA 142 03/19/2020   K 4.1 03/19/2020   CL 100 03/19/2020   CO2 26 03/19/2020   No results found for: ALT, AST, GGT, ALKPHOS, BILITOT Lab Results  Component Value Date   HGBA1C 5.2 11/04/2020   HGBA1C 5.4 06/25/2020   Lab Results  Component Value Date   INSULIN 17.6 11/04/2020   INSULIN 29.3 (H) 06/25/2020   Lab Results  Component Value Date   TSH 3.220 03/19/2020   Lab Results  Component Value Date   CHOL 196 11/04/2020   HDL 35 (L) 11/04/2020   LDLCALC 131 (H) 11/04/2020   TRIG 167 (H) 11/04/2020   CHOLHDL 4.6 03/19/2020    Attestation Statements:   Reviewed by clinician on day of visit: allergies, medications, problem list, medical history, surgical history, family history, social history, and previous encounter notes.  03/21/2020, am acting as transcriptionist for Edmund Hilda, MD.   I have reviewed the above documentation for accuracy and completeness, and I agree with the above. - Reuben Likes, MD

## 2021-01-12 ENCOUNTER — Other Ambulatory Visit (HOSPITAL_COMMUNITY): Payer: Self-pay | Admitting: Family Medicine

## 2021-01-12 ENCOUNTER — Telehealth: Payer: No Typology Code available for payment source | Admitting: Family Medicine

## 2021-01-12 DIAGNOSIS — J029 Acute pharyngitis, unspecified: Secondary | ICD-10-CM | POA: Diagnosis not present

## 2021-01-12 DIAGNOSIS — R509 Fever, unspecified: Secondary | ICD-10-CM

## 2021-01-12 DIAGNOSIS — R0989 Other specified symptoms and signs involving the circulatory and respiratory systems: Secondary | ICD-10-CM | POA: Diagnosis not present

## 2021-01-12 DIAGNOSIS — R0981 Nasal congestion: Secondary | ICD-10-CM | POA: Diagnosis not present

## 2021-01-12 DIAGNOSIS — Z9189 Other specified personal risk factors, not elsewhere classified: Secondary | ICD-10-CM

## 2021-01-12 MED ORDER — GUAIFENESIN 100 MG/5ML PO SOLN: 5.0000 mL | ORAL | 0 refills | Status: DC | PRN

## 2021-01-12 MED ORDER — SALINE SPRAY 0.65 % NA SOLN: 1.0000 | NASAL | 0 refills | Status: DC | PRN

## 2021-01-12 MED ORDER — AZITHROMYCIN 250 MG PO TABS: ORAL_TABLET | ORAL | 0 refills | Status: DC

## 2021-01-12 MED ORDER — ACETAMINOPHEN 500 MG PO TABS: 500.0000 mg | ORAL_TABLET | Freq: Four times a day (QID) | ORAL | 0 refills | Status: DC | PRN

## 2021-01-12 MED FILL — AZITHROMYCIN 250 MG TABLET: 250 | 5 days supply | Qty: 6 | Fill #0

## 2021-01-12 MED FILL — ROBAFEN MUCUS/CHEST CONGEST: 200 | 8 days supply | Qty: 236 | Fill #0

## 2021-01-12 MED FILL — METFORMIN HCL 500 MG TABS: 500 | 30 days supply | Qty: 30 | Fill #0

## 2021-01-12 NOTE — Progress Notes (Signed)
[name], Brian Obrien are scheduled for a virtual visit with your provider today.    Just as we do with appointments in the office, we must obtain your consent to participate.  Your consent will be active for this visit and any virtual visit you may have with one of our providers in the next 365 days.    If you have a MyChart account, I can also send a copy of this consent to you electronically.  All virtual visits are billed to your insurance company just like a traditional visit in the office.  As this is a virtual visit, video technology does not allow for your provider to perform a traditional examination.  This may limit your provider's ability to fully assess your condition.  If your provider identifies any concerns that need to be evaluated in person or the need to arrange testing such as labs, EKG, etc, we will make arrangements to do so.    Although advances in technology are sophisticated, we cannot ensure that it will always work on either your end or our end.  If the connection with a video visit is poor, we may have to switch to a telephone visit.  With either a video or telephone visit, we are not always able to ensure that we have a secure connection.   I need to obtain your verbal consent now.   Are you willing to proceed with your visit today?   Vasco Chong has provided verbal consent on 01/12/2021 for a virtual visit (video or telephone).    Video connection was lost when less than 50% of the duration of the visit was complete, at which time the remainder of the visit was completed via audio only.  Virtual Visit via Video Note  I connected with Kenson Caprio on 01/12/21 at  2:00 PM EST by a video enabled telemedicine application and verified that I am speaking with the correct person using two identifiers.  Location: Patient: Home Provider: Home   I discussed the limitations of evaluation and management by telemedicine and the availability of in person appointments. The patient  expressed understanding and agreed to proceed.  History of Present Illness:   Leonard Feigel is a 35 year old male that presents via video/telephone for upper respiratory symptoms.  Patient has complaints of sore throat, headache, nasal congestion, chest congestion, productive cough, and fever/chills over the past several days.  Patient states that he was in usual state of health until this past weekend.  He was sent home from the ER with the symptoms.  Patient has attempted over-the-counter Tylenol without sustained relief.  Patient took a COVID-19 test, results are pending at this time.  He denies any current sick contacts.  Vaccinated against COVID-19.  URI  This is a new problem. The current episode started in the past 7 days. The problem has been gradually worsening. The maximum temperature recorded prior to his arrival was 100.4 - 100.9 F. The fever has been present for 1 to 2 days. Associated symptoms include chest pain, coughing, headaches, rhinorrhea and a sore throat. Pertinent negatives include no abdominal pain, diarrhea, ear pain, nausea, neck pain, swollen glands or wheezing. He has tried increased fluids, acetaminophen and NSAIDs for the symptoms. The treatment provided no relief.    Past Medical History:  Diagnosis Date  . Allergy   . Anxiety   . Chest pain   . 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  . Chronic neck pain 11/12/2019  . Chronic  pain of left knee 11/12/2019  . Dependence on nicotine from chewing tobacco 12/15/2017  . Depression   . Fatty liver   . GERD (gastroesophageal reflux disease)   . Hyperlipidemia   . Left hip pain   . Left sided sciatica 03/15/2018  . Lytic bone lesions on xray 02/07/2018   Formatting of this note might be different from the original. Of right iliac crest.  Seen on x-ray in 1-19 and CT on 02/05/2018. Unchanged xray in 03/2018. Repeat in 6 months (CT vs Xray)  . Metabolic syndrome 12/25/2019  . Obesity (BMI  30-39.9) 12/15/2017  . OSA (obstructive sleep apnea) 12/15/2017   2015; unable to tolerate CPAP  . Palpitations   . Severe obesity (BMI 35.0-39.9) with comorbidity (HCC) 11/12/2019  . Sleep apnea   . SOB (shortness of breath)   . Vitamin B 12 deficiency   . Vitamin D deficiency    Social History   Socioeconomic History  . Marital status: Single    Spouse name: Not on file  . Number of children: Not on file  . Years of education: Not on file  . Highest education level: Not on file  Occupational History  . Occupation: Set designer  Tobacco Use  . Smoking status: Former Smoker    Packs/day: 0.25    Types: Cigarettes    Quit date: 03/06/2012    Years since quitting: 8.8  . Smokeless tobacco: Former Neurosurgeon    Types: Chew    Quit date: 03/17/2016  Substance and Sexual Activity  . Alcohol use: Not Currently  . Drug use: Never  . Sexual activity: Not on file  Other Topics Concern  . Not on file  Social History Narrative  . Not on file   Social Determinants of Health   Financial Resource Strain: Not on file  Food Insecurity: Not on file  Transportation Needs: Not on file  Physical Activity: Not on file  Stress: Not on file  Social Connections: Not on file  Intimate Partner Violence: Not on file   Immunization History  Administered Date(s) Administered  . Influenza, Seasonal, Injecte, Preservative Fre 08/23/2019  . Influenza,inj,Quad PF,6+ Mos 08/11/2020  . Influenza-Unspecified 09/21/2017  . PFIZER(Purple Top)SARS-COV-2 Vaccination 11/18/2019, 12/09/2019    Review of Systems  Constitutional: Positive for chills, fever and malaise/fatigue.  HENT: Positive for rhinorrhea and sore throat. Negative for ear pain.   Eyes: Negative.   Respiratory: Positive for cough. Negative for shortness of breath and wheezing.   Cardiovascular: Positive for chest pain.  Gastrointestinal: Negative.  Negative for abdominal pain, diarrhea and nausea.  Genitourinary: Negative.   Musculoskeletal:  Positive for myalgias. Negative for neck pain.  Neurological: Positive for headaches.  Psychiatric/Behavioral: Negative.      Assessment and Plan:  1. Upper respiratory symptom - azithromycin (ZITHROMAX) 250 MG tablet; TAKE 500 ML TODAY, TAKE 250 ML ON DAYS 2-5  Dispense: 6 tablet; Refill: 0  2. Sore throat - acetaminophen (TYLENOL) 500 MG tablet; Take 1 tablet (500 mg total) by mouth every 6 (six) hours as needed for mild pain, moderate pain, fever or headache.  Dispense: 30 tablet; Refill: 0  3. Chest congestion - guaiFENesin (ROBITUSSIN) 100 MG/5ML SOLN; Take 5 mLs (100 mg total) by mouth every 4 (four) hours as needed for cough or to loosen phlegm.  Dispense: 236 mL; Refill: 0  4. Nasal congestion - sodium chloride (OCEAN) 0.65 % SOLN nasal spray; Place 1 spray into both nostrils as needed for congestion.  Dispense: 30  mL; Refill: 0  5. Fever, unspecified fever cause - acetaminophen (TYLENOL) 500 MG tablet; Take 1 tablet (500 mg total) by mouth every 6 (six) hours as needed for mild pain, moderate pain, fever or headache.  Dispense: 30 tablet; Refill: 0  6. At increased risk of exposure to COVID-19 virus Recommended patient continues to quarantine while awaiting COVID-19 test results. Follow Up Instructions:    I discussed the assessment and treatment plan with the patient. The patient was provided an opportunity to ask questions and all were answered. The patient agreed with the plan and demonstrated an understanding of the instructions.   The patient was advised to call back or seek an in-person evaluation if the symptoms worsen or if the condition fails to improve as anticipated.  I provided 8 minutes of non-face-to-face time during this encounter.   Nolon Nations  APRN, MSN, FNP-C Patient Care [facility] [address] [place], [place] [address] [phone]

## 2021-01-12 NOTE — Patient Instructions (Signed)
Azithromycin 500 mg in the morning and 250 mg on days 2 through 5 Guaifenesin 5 mL every 4 hours as needed for cough and chest congestion Recommend nasal saline as needed for nasal congestion. Tylenol 500 mg every 6 hours as needed for fever, body aches, and/or headache Recommend that you quarantine until you receive your COVID-19 test results.  Upper Respiratory Infection, Adult An upper respiratory infection (URI) affects the nose, throat, and upper air passages. URIs are caused by germs (viruses). The most common type of URI is often called "the common cold." Medicines cannot cure URIs, but you can do things at home to relieve your symptoms. URIs usually get better within 7-10 days. Follow these instructions at home: Activity  Rest as needed.  If you have a fever, stay home from work or school until your fever is gone, or until your doctor says you may return to work or school. ? You should stay home until you cannot spread the infection anymore (you are not contagious). ? Your doctor may have you wear a face mask so you have less risk of spreading the infection. Relieving symptoms  Gargle with a salt-water mixture 3-4 times a day or as needed. To make a salt-water mixture, completely dissolve -1 tsp of salt in 1 cup of warm water.  Use a cool-mist humidifier to add moisture to the air. This can help you breathe more easily. Eating and drinking  Drink enough fluid to keep your pee (urine) pale yellow.  Eat soups and other clear broths.   General instructions  Take over-the-counter and prescription medicines only as told by your doctor. These include cold medicines, fever reducers, and cough suppressants.  Do not use any products that contain nicotine or tobacco. These include cigarettes and e-cigarettes. If you need help quitting, ask your doctor.  Avoid being where people are smoking (avoid secondhand smoke).  Make sure you get regular shots and get the flu shot every  year.  Keep all follow-up visits as told by your doctor. This is important.   How to avoid spreading infection to others  Wash your hands often with soap and water. If you do not have soap and water, use hand sanitizer.  Avoid touching your mouth, face, eyes, or nose.  Cough or sneeze into a tissue or your sleeve or elbow. Do not cough or sneeze into your hand or into the air.   Contact a doctor if:  You are getting worse, not better.  You have any of these: ? A fever. ? Chills. ? Brown or red mucus in your nose. ? Yellow or brown fluid (discharge)coming from your nose. ? Pain in your face, especially when you bend forward. ? Swollen neck glands. ? Pain with swallowing. ? White areas in the back of your throat. Get help right away if:  You have shortness of breath that gets worse.  You have very bad or constant: ? Headache. ? Ear pain. ? Pain in your forehead, behind your eyes, and over your cheekbones (sinus pain). ? Chest pain.  You have long-lasting (chronic) lung disease along with any of these: ? Wheezing. ? Long-lasting cough. ? Coughing up blood. ? A change in your usual mucus.  You have a stiff neck.  You have changes in your: ? Vision. ? Hearing. ? Thinking. ? Mood. Summary  An upper respiratory infection (URI) is caused by a germ called a virus. The most common type of URI is often called "the common cold."  URIs  usually get better within 7-10 days.  Take over-the-counter and prescription medicines only as told by your doctor. This information is not intended to replace advice given to you by your health care provider. Make sure you discuss any questions you have with your health care provider. Document Revised: 07/17/2020 Document Reviewed: 07/17/2020 Elsevier Patient Education  2021 [employer].

## 2021-01-27 ENCOUNTER — Other Ambulatory Visit: Payer: Self-pay

## 2021-01-27 ENCOUNTER — Encounter (INDEPENDENT_AMBULATORY_CARE_PROVIDER_SITE_OTHER): Payer: Self-pay | Admitting: Family Medicine

## 2021-01-27 ENCOUNTER — Other Ambulatory Visit (HOSPITAL_COMMUNITY): Payer: Self-pay | Admitting: Family Medicine

## 2021-01-27 ENCOUNTER — Ambulatory Visit (INDEPENDENT_AMBULATORY_CARE_PROVIDER_SITE_OTHER): Payer: No Typology Code available for payment source | Admitting: Family Medicine

## 2021-01-27 VITALS — BP 108/72 | HR 63 | Temp 98.0°F | Ht 69.0 in | Wt 225.0 lb

## 2021-01-27 DIAGNOSIS — E8881 Metabolic syndrome: Secondary | ICD-10-CM | POA: Diagnosis not present

## 2021-01-27 DIAGNOSIS — R632 Polyphagia: Secondary | ICD-10-CM

## 2021-01-27 DIAGNOSIS — Z9189 Other specified personal risk factors, not elsewhere classified: Secondary | ICD-10-CM

## 2021-01-27 DIAGNOSIS — Z6833 Body mass index (BMI) 33.0-33.9, adult: Secondary | ICD-10-CM | POA: Diagnosis not present

## 2021-01-27 DIAGNOSIS — E6609 Other obesity due to excess calories: Secondary | ICD-10-CM | POA: Diagnosis not present

## 2021-01-27 MED ORDER — METFORMIN HCL 500 MG PO TABS: 500.0000 mg | ORAL_TABLET | Freq: Every day | ORAL | 0 refills | Status: DC

## 2021-01-27 MED ORDER — WEGOVY 0.25 MG/0.5ML ~~LOC~~ SOAJ: 0.2500 mg | SUBCUTANEOUS | 0 refills | Status: DC

## 2021-01-28 ENCOUNTER — Encounter (INDEPENDENT_AMBULATORY_CARE_PROVIDER_SITE_OTHER): Payer: Self-pay | Admitting: Family Medicine

## 2021-01-28 NOTE — Progress Notes (Signed)
Chief Complaint:   OBESITY Brian Obrien is here to discuss his progress with his obesity treatment plan along with follow-up of his obesity related diagnoses. Brian Obrien is on following a lower carbohydrate, vegetable and lean protein rich diet plan and states he is following his eating plan approximately 70% of the time. Brian Obrien states he is weight training 50 minutes 3 times per week.  Today's visit was #: 12 Starting weight: 242 lbs Starting date: 06/25/2020 Today's weight: 225 lbs Today's date: 01/27/2021 Total lbs lost to date: 17 lbs Total lbs lost since last in-office visit: 0  Interim History: Brian Obrien had COVID last month and ate carbs while sick and has struggled to get back to low carb plan. He still wants to stick with low carb despite recent struggles. He was off plan yesterday and had rice and beans.  Subjective:   1. Insulin resistance Brian Obrien is on Metformin. His last A1c was 5.2. He reports polyphagia. His fasting insulin was 17.6, which is down from 29.3.   Lab Results  Component Value Date   INSULIN 17.6 11/04/2020   INSULIN 29.3 (H) 06/25/2020   Lab Results  Component Value Date   HGBA1C 5.2 11/04/2020    2. Polyphagia Brian Obrien notes hunger at night. He is on Metformin which helps but not as well as it was.  He has no history of cholelithiasis, pancreatitis, or personal/family history of thyroid cancer.  3. At risk for side effect of medication Brian Obrien is at risk for side effects of medication due to starting [facility].  Assessment/Plan:   1. Insulin resistance Brian Obrien will continue to work on weight loss, exercise, and decreasing simple carbohydrates to help decrease the risk of diabetes. Brian Obrien agreed to follow-up with [place] as directed to closely monitor his progress.  - metFORMIN (GLUCOPHAGE) 500 MG tablet; Take 1 tablet (500 mg total) by mouth daily with breakfast.  Dispense: 30 tablet; Refill: 0  2. Polyphagia Intensive lifestyle modifications are the first  line treatment for this issue. We discussed several lifestyle modifications today and he will continue to work on diet, exercise and weight loss efforts. Orders and follow up as documented in patient record. Start Wegovy 0.25 mg, as per below.  - Semaglutide-Weight Management (WEGOVY) 0.25 MG/0.5ML SOAJ; Inject 0.25 mg into the skin once a week.  Dispense: 2 mL; Refill: 0  3. At risk for side effect of medication Brian Obrien was given approximately 15 minutes of drug side effect counseling today.  We discussed side effect possibility and risk versus benefits. Brian Obrien agreed to the medication and will contact this office if these side effects are intolerable.  Repetitive spaced learning was employed today to elicit superior memory formation and behavioral change.  4. Class 1 obesity due to excess calories with serious comorbidity and body mass index (BMI) of 33.0 to 33.9 in adult Brian Obrien is currently in the action stage of change. As such, his goal is to continue with weight loss efforts. He has agreed to following a lower carbohydrate, vegetable and lean protein rich diet plan.   Exercise goals: As is  Behavioral modification strategies: increasing lean protein intake and decreasing simple carbohydrates.  Brian Obrien has agreed to follow-up with our clinic in 3 weeks.  Objective:   Blood pressure 108/72, pulse 63, temperature 98 F (36.7 C), height 5\' 9"  (1.753 m), weight 225 lb (102.1 kg), SpO2 98 %. Body mass index is 33.23 kg/m.  General: Cooperative, alert, well developed, in no acute distress. HEENT: Conjunctivae and lids  unremarkable. Cardiovascular: Regular rhythm.  Lungs: Normal work of breathing. Neurologic: No focal deficits.   Lab Results  Component Value Date   CREATININE 0.88 03/19/2020   BUN 14 03/19/2020   NA 142 03/19/2020   K 4.1 03/19/2020   CL 100 03/19/2020   CO2 26 03/19/2020   No results found for: ALT, AST, GGT, ALKPHOS, BILITOT Lab Results  Component Value  Date   HGBA1C 5.2 11/04/2020   HGBA1C 5.4 06/25/2020   Lab Results  Component Value Date   INSULIN 17.6 11/04/2020   INSULIN 29.3 (H) 06/25/2020   Lab Results  Component Value Date   TSH 3.220 03/19/2020   Lab Results  Component Value Date   CHOL 196 11/04/2020   HDL 35 (L) 11/04/2020   LDLCALC 131 (H) 11/04/2020   TRIG 167 (H) 11/04/2020   CHOLHDL 4.6 03/19/2020   No results found for: WBC, HGB, HCT, MCV, PLT No results found for: IRON, TIBC, FERRITIN  Attestation Statements:   Reviewed by clinician on day of visit: allergies, medications, problem list, medical history, surgical history, family history, social history, and previous encounter notes.  Brian Obrien, am acting as Energy manager for [place], FNP.  I have reviewed the above documentation for accuracy and completeness, and I agree with the above. -  Brian Sans, FNP

## 2021-01-29 ENCOUNTER — Other Ambulatory Visit: Payer: Self-pay

## 2021-01-30 ENCOUNTER — Ambulatory Visit (INDEPENDENT_AMBULATORY_CARE_PROVIDER_SITE_OTHER): Payer: No Typology Code available for payment source | Admitting: Medical

## 2021-01-30 ENCOUNTER — Ambulatory Visit: Payer: No Typology Code available for payment source | Admitting: Medical

## 2021-01-30 ENCOUNTER — Other Ambulatory Visit: Payer: Self-pay

## 2021-01-30 VITALS — BP 115/66 | HR 66 | Resp 18 | Ht 69.0 in | Wt 231.8 lb

## 2021-01-30 DIAGNOSIS — R6882 Decreased libido: Secondary | ICD-10-CM

## 2021-01-30 DIAGNOSIS — Z0000 Encounter for general adult medical examination without abnormal findings: Secondary | ICD-10-CM | POA: Diagnosis not present

## 2021-01-30 DIAGNOSIS — Z113 Encounter for screening for infections with a predominantly sexual mode of transmission: Secondary | ICD-10-CM

## 2021-01-30 DIAGNOSIS — E7849 Other hyperlipidemia: Secondary | ICD-10-CM

## 2021-01-30 DIAGNOSIS — R5383 Other fatigue: Secondary | ICD-10-CM | POA: Diagnosis not present

## 2021-01-30 DIAGNOSIS — E0789 Other specified disorders of thyroid: Secondary | ICD-10-CM

## 2021-01-30 DIAGNOSIS — E010 Iodine-deficiency related diffuse (endemic) goiter: Secondary | ICD-10-CM

## 2021-01-30 NOTE — Progress Notes (Signed)
Subjective:    Patient ID: Brian Obrien, male    DOB: 1985-12-09, 35 y.o.   MRN: [id number]  HPI  Pt in for cpe/wellness.  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 Pt is doing low carb diet about 80% of the time. Non smoker. Former chewed tobacco. Single.  Pt also states some anterior neck discomfort. States some pain swollowing and little sore sensation.  Also pt reports some back of neck region pain. Pt states he has seen sports medicine and had PT for back of neck pain. At times feels tight. Back of neck pain on and off for about 1.5-2 years.    IMPRESSION: C6-7: Shallow chronic appearing disc herniation with slight caudal down turning in the midline behind C7. This indents the thecal sac slightly but does not appear to result in compressive stenosis. AP diameter of the canal in that region 9 mm. No foraminal stenosis. Findings could be associated with neck pain.  At C2-3 and C3-4, there are probably small central disc bulges. No compressive canal or foraminal narrowing. This appearance could also possibly relate to early calcification of the posterior longitudinal Ligament.  Pt also reports feeling low energy all the time/fatigued. Vit d is supplamented and b12 normal.  Known high cholesterol 2 months ago.    Review of Systems  Constitutional: Negative for chills, fatigue and fever.  HENT: Negative for congestion, ear discharge and ear pain.   Respiratory: Negative for cough, chest tightness, shortness of breath and wheezing.   Cardiovascular: Negative for chest pain and palpitations.  Gastrointestinal: Negative for abdominal pain, diarrhea and vomiting.  Genitourinary: Negative for enuresis, flank pain and urgency.  Musculoskeletal: Negative for back pain, joint swelling and neck pain.  Skin: Negative for rash.  Neurological: Negative for facial asymmetry and numbness.  Hematological: Negative for adenopathy. Does not bruise/bleed easily.   Psychiatric/Behavioral: Negative for behavioral problems, dysphoric mood and suicidal ideas. The patient is not nervous/anxious.     Past Medical History:  Diagnosis Date  . Allergy   . Anxiety   . Chest pain   . 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  . Chronic neck pain 11/12/2019  . Chronic pain of left knee 11/12/2019  . Dependence on nicotine from chewing tobacco 12/15/2017  . Depression   . Fatty liver   . GERD (gastroesophageal reflux disease)   . Hyperlipidemia   . Left hip pain   . Left sided sciatica 03/15/2018  . Lytic bone lesions on xray 02/07/2018   Formatting of this note might be different from the original. Of right iliac crest.  Seen on x-ray in 1-19 and CT on 02/05/2018. Unchanged xray in 03/2018. Repeat in 6 months (CT vs Xray)  . Metabolic syndrome 12/25/2019  . Obesity (BMI 30-39.9) 12/15/2017  . OSA (obstructive sleep apnea) 12/15/2017   2015; unable to tolerate CPAP  . Palpitations   . Severe obesity (BMI 35.0-39.9) with comorbidity (HCC) 11/12/2019  . Sleep apnea   . SOB (shortness of breath)   . Vitamin B 12 deficiency   . Vitamin D deficiency      Social History   Socioeconomic History  . Marital status: Single    Spouse name: Not on file  . Number of children: Not on file  . Years of education: Not on file  . Highest education level: Not on file  Occupational History  . Occupation: Set designer  Tobacco Use  . Smoking status: Former Smoker  Packs/day: 0.25    Types: Cigarettes    Quit date: 03/06/2012    Years since quitting: 8.9  . Smokeless tobacco: Former Neurosurgeon    Types: Chew    Quit date: 03/17/2016  Substance and Sexual Activity  . Alcohol use: Not Currently  . Drug use: Never  . Sexual activity: Not on file  Other Topics Concern  . Not on file  Social History Narrative  . Not on file   Social Determinants of Health   Financial Resource Strain: Not on file  Food Insecurity: Not on file  Transportation  Needs: Not on file  Physical Activity: Not on file  Stress: Not on file  Social Connections: Not on file  Intimate Partner Violence: Not on file    Past Surgical History:  Procedure Laterality Date  . HAIR TRANSPLANT      Family History  Problem Relation Age of Onset  . COPD Father   . Heart disease Father   . Liver disease Father   . Depression Father   . Diabetes Mother   . High blood pressure Mother   . High Cholesterol Mother   . Kidney disease Mother   . Thyroid disease Mother   . Sleep apnea Mother   . Obesity Mother     No Known Allergies  Current Outpatient Medications on File Prior to Visit  Medication Sig Dispense Refill  . acetaminophen (TYLENOL) 500 MG tablet Take 1 tablet (500 mg total) by mouth every 6 (six) hours as needed for mild pain, moderate pain, fever or headache. 30 tablet 0  . albuterol (VENTOLIN HFA) 108 (90 Base) MCG/ACT inhaler Inhale 2 puffs into the lungs every 6 (six) hours as needed. 18 g 0  . azelastine (ASTELIN) 0.1 % nasal spray Place 2 sprays into both nostrils 2 (two) times daily. Use in each nostril as directed 30 mL 2  . Biotin 08676 MCG TABS Take by mouth.    . Cholecalciferol (VITAMIN D) 125 MCG (5000 UT) CAPS Take 1 capsule by mouth daily. 30 capsule 0  . cyclobenzaprine (FLEXERIL) 10 MG tablet Take 10 mg by mouth at bedtime.    Marland Kitchen guaiFENesin (ROBITUSSIN) 100 MG/5ML SOLN Take 5 mLs (100 mg total) by mouth every 4 (four) hours as needed for cough or to loosen phlegm. 236 mL 0  . loratadine (CLARITIN) 10 MG tablet Take 10 mg by mouth daily.    . metFORMIN (GLUCOPHAGE) 500 MG tablet Take 1 tablet (500 mg total) by mouth daily with breakfast. 30 tablet 0  . Multiple Vitamin (MULTIVITAMIN) tablet Take 1 tablet by mouth every other day.    . Omega-3 Fatty Acids (FISH OIL) 1000 MG CAPS Take by mouth.    . Semaglutide-Weight Management (WEGOVY) 0.25 MG/0.5ML SOAJ Inject 0.25 mg into the skin once a week. 2 mL 0  . sodium chloride (OCEAN)  0.65 % SOLN nasal spray Place 1 spray into both nostrils as needed for congestion. 30 mL 0  . azithromycin (ZITHROMAX) 250 MG tablet TAKE 500 ML TODAY, TAKE 250 ML ON DAYS 2-5 (Patient not taking: Reported on 01/30/2021) 6 tablet 0   No current facility-administered medications on file prior to visit.    BP 115/66   Pulse 66   Resp 18   Ht 5\' 9"  (1.753 m)   Wt 231 lb 12.8 oz (105.1 kg)   SpO2 98%   BMI 34.23 kg/m       Objective:   Physical Exam  General Mental Status-  Alert. General Appearance- Not in acute distress.   Skin General: Color- Normal Color. Moisture- Normal Moisture.  Neck Carotid Arteries- Normal color. Moisture- Normal Moisture. No carotid bruits. No JVD. Faint tender over thyroid. Possible enlargement minimally.  Chest and Lung Exam Auscultation: Breath Sounds:-Normal.  Cardiovascular Auscultation:Rythm- Regular. Murmurs & Other Heart Sounds:Auscultation of the heart reveals- No Murmurs.  Abdomen Inspection:-Inspeection Normal. Palpation/Percussion:Note:No mass. Palpation and Percussion of the abdomen reveal- Non Tender, Non Distended + BS, no rebound or guarding.    Neurologic Cranial Nerve exam:- CN III-XII intact(No nystagmus), symmetric smile. Strength:- 5/5 equal and symmetric strength both upper and lower extremities.      Assessment & Plan:  For you wellness exam today I have ordered cbc, cmp and  lipid panel. Future labs placed.  For fatigue added iron panel. Vit d.b12  thyroid studies done in recent past.   Not sure on tdap hx. Pt will find out and update [place].  Recommend exercise and healthy diet.  We will let you know lab results as they come in.  Follow up date appointment will be determined after lab review.   For low energy and slight low libido placed future testosterone panel.  Will get thyroid US, tsh and t4.  For neck pain. You do have + mri findings but no radicular pain. Can continue muscle relaxant and gabpantin.  Also follow up with Dr. Jordan Likes.  [place], [place]   [id number] charge as well in addtion to wellness as addressed neck pain, thyroid region pain, fatigue and low libido.

## 2021-01-30 NOTE — Patient Instructions (Addendum)
For you wellness exam today I have ordered cbc, cmp and  lipid panel. Future labs placed.  For fatigue added iron panel. Vit d.b12  thyroid studies done in recent past.   Not sure on tdap hx. Pt will find out and update [place].  Recommend exercise and healthy diet.  We will let you know lab results as they come in.  Follow up date appointment will be determined after lab review.   For low energy and slight low libido placed future testosterone panel.  Will get thyroid US, tsh and t4.  For neck pain. You do have + mri findings but no radicular pain. Can continue muscle relaxant and gabpantin. Also follow up with Dr. Jordan Likes.   Preventive Care 27-33 Years Old, Male Preventive care refers to lifestyle choices and visits with your health care provider that can promote health and wellness. This includes:  A yearly physical exam. This is also called an annual wellness visit.  Regular dental and eye exams.  Immunizations.  Screening for certain conditions.  Healthy lifestyle choices, such as: ? Eating a healthy diet. ? Getting regular exercise. ? Not using drugs or products that contain nicotine and tobacco. ? Limiting alcohol use. What can I expect for my preventive care visit? Physical exam Your health care provider may check your:  Height and weight. These may be used to calculate your BMI (body mass index). BMI is a measurement that tells if you are at a healthy weight.  Heart rate and blood pressure.  Body temperature.  Skin for abnormal spots. Counseling Your health care provider may ask you questions about your:  Past medical problems.  Family's medical history.  Alcohol, tobacco, and drug use.  Emotional well-being.  Home life and relationship well-being.  Sexual activity.  Diet, exercise, and sleep habits.  Work and work Astronomer.  Access to firearms. What immunizations do I need? Vaccines are usually given at various ages, according to a schedule.  Your health care provider will recommend vaccines for you based on your age, medical history, and lifestyle or other factors, such as travel or where you work.   What tests do I need? Blood tests  Lipid and cholesterol levels. These may be checked every 5 years starting at age 74.  Hepatitis C test.  Hepatitis B test. Screening  Diabetes screening. This is done by checking your blood sugar (glucose) after you have not eaten for a while (fasting).  Genital exam to check for testicular cancer or hernias.  STD (sexually transmitted disease) testing, if you are at risk. Talk with your health care provider about your test results, treatment options, and if necessary, the need for more tests.   Follow these instructions at home: Eating and drinking  Eat a healthy diet that includes fresh fruits and vegetables, whole grains, lean protein, and low-fat dairy products.  Drink enough fluid to keep your urine pale yellow.  Take vitamin and mineral supplements as recommended by your health care provider.  Do not drink alcohol if your health care provider tells you not to drink.  If you drink alcohol: ? Limit how much you have to 0-2 drinks a day. ? Be aware of how much alcohol is in your drink. In the [place] one drink equals one 12 oz bottle of beer (355 mL), one 5 oz glass of wine (148 mL), or one 1 oz glass of hard liquor (44 mL).   Lifestyle  Take daily care of your teeth and gums. Brush your teeth  every morning and night with fluoride toothpaste. Floss one time each day.  Stay active. Exercise for at least 30 minutes 5 or more days each week.  Do not use any products that contain nicotine or tobacco, such as cigarettes, e-cigarettes, and chewing tobacco. If you need help quitting, ask your health care provider.  Do not use drugs.  If you are sexually active, practice safe sex. Use a condom or other form of protection to prevent STIs (sexually transmitted infections).  Find healthy  ways to cope with stress, such as: ? Meditation, yoga, or listening to music. ? Journaling. ? Talking to a trusted person. ? Spending time with friends and family. Safety  Always wear your seat belt while driving or riding in a vehicle.  Do not drive: ? If you have been drinking alcohol. Do not ride with someone who has been drinking. ? When you are tired or distracted. ? While texting.  Wear a helmet and other protective equipment during sports activities.  If you have firearms in your house, make sure you follow all gun safety procedures.  Seek help if you have been physically or sexually abused. What's next?  Go to your health care provider once a year for an annual wellness visit.  Ask your health care provider how often you should have your eyes and teeth checked.  Stay up to date on all vaccines. This information is not intended to replace advice given to you by your health care provider. Make sure you discuss any questions you have with your health care provider. Document Revised: 07/25/2019 Document Reviewed: 11/02/2018 Elsevier Patient Education  2021 [employer].

## 2021-02-02 ENCOUNTER — Other Ambulatory Visit: Payer: No Typology Code available for payment source

## 2021-02-03 ENCOUNTER — Other Ambulatory Visit: Payer: No Typology Code available for payment source

## 2021-02-03 ENCOUNTER — Other Ambulatory Visit (INDEPENDENT_AMBULATORY_CARE_PROVIDER_SITE_OTHER): Payer: No Typology Code available for payment source

## 2021-02-03 ENCOUNTER — Other Ambulatory Visit: Payer: Self-pay

## 2021-02-03 ENCOUNTER — Ambulatory Visit (HOSPITAL_BASED_OUTPATIENT_CLINIC_OR_DEPARTMENT_OTHER)
Admission: RE | Admit: 2021-02-03 | Discharge: 2021-02-03 | Disposition: A | Discharge status: Home / Self Care | Payer: No Typology Code available for payment source | Source: Ambulatory Visit | Attending: Medical | Admitting: Medical

## 2021-02-03 DIAGNOSIS — E010 Iodine-deficiency related diffuse (endemic) goiter: Secondary | ICD-10-CM

## 2021-02-03 DIAGNOSIS — E7849 Other hyperlipidemia: Secondary | ICD-10-CM | POA: Diagnosis not present

## 2021-02-03 DIAGNOSIS — R5383 Other fatigue: Secondary | ICD-10-CM | POA: Diagnosis not present

## 2021-02-03 DIAGNOSIS — E0789 Other specified disorders of thyroid: Secondary | ICD-10-CM | POA: Diagnosis present

## 2021-02-03 DIAGNOSIS — Z0000 Encounter for general adult medical examination without abnormal findings: Secondary | ICD-10-CM | POA: Diagnosis not present

## 2021-02-03 DIAGNOSIS — Z113 Encounter for screening for infections with a predominantly sexual mode of transmission: Secondary | ICD-10-CM

## 2021-02-03 DIAGNOSIS — R6882 Decreased libido: Secondary | ICD-10-CM

## 2021-02-03 LAB — CBC WITH DIFFERENTIAL/PLATELET
Basophils Absolute: 0.1 10*3/uL (ref 0.0–0.1)
Basophils Relative: 1 % (ref 0.0–3.0)
Eosinophils Absolute: 0.1 10*3/uL (ref 0.0–0.7)
Eosinophils Relative: 2.1 % (ref 0.0–5.0)
HCT: 43.3 % (ref 39.0–52.0)
Hemoglobin: 14.6 g/dL (ref 13.0–17.0)
Lymphocytes Relative: 36.6 % (ref 12.0–46.0)
Lymphs Abs: 2.3 10*3/uL (ref 0.7–4.0)
MCHC: 33.8 g/dL (ref 30.0–36.0)
MCV: 82.9 fl (ref 78.0–100.0)
Monocytes Absolute: 0.5 10*3/uL (ref 0.1–1.0)
Monocytes Relative: 8.2 % (ref 3.0–12.0)
Neutro Abs: 3.3 10*3/uL (ref 1.4–7.7)
Neutrophils Relative %: 52.1 % (ref 43.0–77.0)
Platelets: 291 10*3/uL (ref 150.0–400.0)
RBC: 5.22 Mil/uL (ref 4.22–5.81)
RDW: 12.8 % (ref 11.5–15.5)
WBC: 6.4 10*3/uL (ref 4.0–10.5)

## 2021-02-03 LAB — COMPREHENSIVE METABOLIC PANEL
ALT: 43 U/L (ref 0–53)
AST: 35 U/L (ref 0–37)
Albumin: 4.3 g/dL (ref 3.5–5.2)
Alkaline Phosphatase: 48 U/L (ref 39–117)
BUN: 12 mg/dL (ref 6–23)
CO2: 30 mEq/L (ref 19–32)
Calcium: 9.6 mg/dL (ref 8.4–10.5)
Chloride: 102 mEq/L (ref 96–112)
Creatinine, Ser: 0.82 mg/dL (ref 0.40–1.50)
GFR: 116.03 mL/min (ref >60.00)
Glucose, Bld: 93 mg/dL (ref 70–99)
Potassium: 4.2 mEq/L (ref 3.5–5.1)
Sodium: 139 mEq/L (ref 135–145)
Total Bilirubin: 0.4 mg/dL (ref 0.2–1.2)
Total Protein: 6.9 g/dL (ref 6.0–8.3)

## 2021-02-03 LAB — LIPID PANEL
Cholesterol: 173 mg/dL (ref 0–200)
HDL: 46.2 mg/dL (ref >39.00)
NonHDL: 126.78
Total CHOL/HDL Ratio: 4
Triglycerides: 249 mg/dL — ABNORMAL HIGH (ref 0.0–149.0)
VLDL: 49.8 mg/dL — ABNORMAL HIGH (ref 0.0–40.0)

## 2021-02-03 LAB — IRON: Iron: 68 ug/dL (ref 42–165)

## 2021-02-03 LAB — LDL CHOLESTEROL, DIRECT: Direct LDL: 112 mg/dL

## 2021-02-03 LAB — T4, FREE: Free T4: 0.74 ng/dL (ref 0.60–1.60)

## 2021-02-03 LAB — TSH: TSH: 3.04 u[IU]/mL (ref 0.35–4.50)

## 2021-02-04 ENCOUNTER — Telehealth: Payer: Self-pay | Admitting: Medical

## 2021-02-04 DIAGNOSIS — R7989 Other specified abnormal findings of blood chemistry: Secondary | ICD-10-CM

## 2021-02-04 LAB — TESTOSTERONE TOTAL,FREE,BIO, MALES
Albumin: 4.4 g/dL (ref 3.6–5.1)
Sex Hormone Binding: 7 nmol/L — ABNORMAL LOW (ref 10–50)
Testosterone: 196 ng/dL — ABNORMAL LOW (ref 250–827)

## 2021-02-04 LAB — HIV ANTIBODY (ROUTINE TESTING W REFLEX): HIV 1&2 Ab, 4th Generation: NONREACTIVE

## 2021-02-04 NOTE — Telephone Encounter (Signed)
Referral to endocrinologist placed. 

## 2021-02-05 ENCOUNTER — Encounter (INDEPENDENT_AMBULATORY_CARE_PROVIDER_SITE_OTHER): Payer: Self-pay

## 2021-02-17 ENCOUNTER — Encounter (INDEPENDENT_AMBULATORY_CARE_PROVIDER_SITE_OTHER): Payer: Self-pay | Admitting: Family Medicine

## 2021-02-17 ENCOUNTER — Other Ambulatory Visit (HOSPITAL_COMMUNITY): Payer: Self-pay | Admitting: Family Medicine

## 2021-02-17 ENCOUNTER — Ambulatory Visit (INDEPENDENT_AMBULATORY_CARE_PROVIDER_SITE_OTHER): Payer: No Typology Code available for payment source | Admitting: Family Medicine

## 2021-02-17 ENCOUNTER — Other Ambulatory Visit: Payer: Self-pay

## 2021-02-17 VITALS — BP 117/73 | HR 78 | Temp 97.8°F | Ht 69.0 in | Wt 225.0 lb

## 2021-02-17 DIAGNOSIS — E8881 Metabolic syndrome: Secondary | ICD-10-CM

## 2021-02-17 DIAGNOSIS — Z9189 Other specified personal risk factors, not elsewhere classified: Secondary | ICD-10-CM | POA: Diagnosis not present

## 2021-02-17 DIAGNOSIS — E88819 Insulin resistance, unspecified: Secondary | ICD-10-CM

## 2021-02-17 DIAGNOSIS — R1013 Epigastric pain: Secondary | ICD-10-CM

## 2021-02-17 DIAGNOSIS — Z6836 Body mass index (BMI) 36.0-36.9, adult: Secondary | ICD-10-CM | POA: Diagnosis not present

## 2021-02-17 DIAGNOSIS — T50905A Adverse effect of unspecified drugs, medicaments and biological substances, initial encounter: Secondary | ICD-10-CM

## 2021-02-17 MED ORDER — WEGOVY 0.5 MG/0.5ML ~~LOC~~ SOAJ: 0.5000 mg | SUBCUTANEOUS | 0 refills | Status: DC

## 2021-02-18 ENCOUNTER — Encounter (INDEPENDENT_AMBULATORY_CARE_PROVIDER_SITE_OTHER): Payer: Self-pay | Admitting: Family Medicine

## 2021-02-18 NOTE — Progress Notes (Signed)
Chief Complaint:   OBESITY Brian Obrien is here to discuss his progress with his obesity treatment plan along with follow-up of his obesity related diagnoses. Brian Obrien is on following a lower carbohydrate, vegetable and lean protein rich diet plan and states he is following his eating plan approximately 60% of the time. Brian Obrien states he is lifting weights 50 minutes 3 times per week.  Today's visit was #: 13 Starting weight: 242 lbs Starting date: 06/25/2020 Today's weight: 225 lbs Today's date: 02/17/2021 Total lbs lost to date: 17 lbs Total lbs lost since last in-office visit: 0  Interim History: Brian Obrien was started on Wegovy at last OV and has taken 2 doses. He notes mild appetite suppression. He has been traveling for work. His eating has been off plan and he did not exercise. He will be fasting for Ramadan the entire month of April. He can't eat or drink from sunrise to sunset. He plans to stick to the plan. His family has breakfast before sunrise so only lunch will be skipped.  Subjective:   1. Drug-induced dyspepsia Brian Obrien has had some burning/dyspepsia in epigastric area since starting [facility]. He usually takes Pepcid Complete for stomach issues.   2. Insulin resistance Brian Obrien is on Metformin. His last fasting insulin level was elevated at 17.6.  Lab Results  Component Value Date   INSULIN 17.6 11/04/2020   INSULIN 29.3 (H) 06/25/2020   Lab Results  Component Value Date   HGBA1C 5.2 11/04/2020    3. At risk for constipation Brian Obrien is at risk for constipation due to increasing dose of Wegovy.  Assessment/Plan:   1. Drug-induced dyspepsia Try Tums for dyspepsia or Pepcid Complete.  2. Insulin resistance  We will discontinue Metformin since he is on [facility] for polyphagia..  3. At risk for constipation Brian Obrien was given approximately 15 minutes of counseling today regarding prevention of constipation. He was encouraged to increase water and fiber intake.   4.  Obesity: BMI 33 Brian Obrien is currently in the action stage of change. As such, his goal is to continue with weight loss efforts. He has agreed to following a lower carbohydrate, vegetable and lean protein rich diet plan.   We discussed various medication options to help Brian Obrien with his weight loss efforts and we both agreed to increasing dose of Wegovy to 0.5 mg, as per below. - Semaglutide-Weight Management (WEGOVY) 0.5 MG/0.5ML SOAJ; Inject 0.5 mg into the skin once a week.  Dispense: 2 mL; Refill: 0  Exercise goals: As is  Behavioral modification strategies: decreasing simple carbohydrates.  Brian Obrien has agreed to follow-up with our clinic in 3 weeks.  Objective:   Blood pressure 117/73, pulse 78, temperature 97.8 F (36.6 C), height 5\' 9"  (1.753 m), weight 225 lb (102.1 kg), SpO2 96 %. Body mass index is 33.23 kg/m.  General: Cooperative, alert, well developed, in no acute distress. HEENT: Conjunctivae and lids unremarkable. Cardiovascular: Regular rhythm.  Lungs: Normal work of breathing. Neurologic: No focal deficits.   Lab Results  Component Value Date   CREATININE 0.82 02/03/2021   BUN 12 02/03/2021   NA 139 02/03/2021   K 4.2 02/03/2021   CL 102 02/03/2021   CO2 30 02/03/2021   Lab Results  Component Value Date   ALT 43 02/03/2021   AST 35 02/03/2021   ALKPHOS 48 02/03/2021   BILITOT 0.4 02/03/2021   Lab Results  Component Value Date   HGBA1C 5.2 11/04/2020   HGBA1C 5.4 06/25/2020   Lab Results  Component Value Date   INSULIN 17.6 11/04/2020   INSULIN 29.3 (H) 06/25/2020   Lab Results  Component Value Date   TSH 3.04 02/03/2021   Lab Results  Component Value Date   CHOL 173 02/03/2021   HDL 46.20 02/03/2021   LDLCALC 131 (H) 11/04/2020   LDLDIRECT 112.0 02/03/2021   TRIG 249.0 (H) 02/03/2021   CHOLHDL 4 02/03/2021   Lab Results  Component Value Date   WBC 6.4 02/03/2021   HGB 14.6 02/03/2021   HCT 43.3 02/03/2021   MCV 82.9 02/03/2021   PLT  291.0 02/03/2021   Lab Results  Component Value Date   IRON 68 02/03/2021   Attestation Statements:   Reviewed by clinician on day of visit: allergies, medications, problem list, medical history, surgical history, family history, social history, and previous encounter notes.  Edmund Hilda, am acting as Energy manager for [place], FNP.  I have reviewed the above documentation for accuracy and completeness, and I agree with the above. -  Jesse Sans, FNP

## 2021-02-21 ENCOUNTER — Other Ambulatory Visit (HOSPITAL_BASED_OUTPATIENT_CLINIC_OR_DEPARTMENT_OTHER): Payer: Self-pay

## 2021-02-22 ENCOUNTER — Other Ambulatory Visit (HOSPITAL_BASED_OUTPATIENT_CLINIC_OR_DEPARTMENT_OTHER): Payer: Self-pay

## 2021-02-22 MED FILL — Semaglutide (Weight Mngmt) Soln Auto-Injector 0.5 MG/0.5ML: SUBCUTANEOUS | 28 days supply | Qty: 2 | Fill #0 | Status: CN

## 2021-03-02 ENCOUNTER — Other Ambulatory Visit (HOSPITAL_BASED_OUTPATIENT_CLINIC_OR_DEPARTMENT_OTHER): Payer: Self-pay

## 2021-03-04 ENCOUNTER — Other Ambulatory Visit (HOSPITAL_BASED_OUTPATIENT_CLINIC_OR_DEPARTMENT_OTHER): Payer: Self-pay

## 2021-03-04 MED FILL — Semaglutide (Weight Mngmt) Soln Auto-Injector 0.5 MG/0.5ML: SUBCUTANEOUS | 28 days supply | Qty: 2 | Fill #0 | Status: AC

## 2021-03-10 ENCOUNTER — Ambulatory Visit (INDEPENDENT_AMBULATORY_CARE_PROVIDER_SITE_OTHER): Payer: No Typology Code available for payment source | Admitting: Family Medicine

## 2021-03-10 ENCOUNTER — Encounter (INDEPENDENT_AMBULATORY_CARE_PROVIDER_SITE_OTHER): Payer: Self-pay

## 2021-03-18 ENCOUNTER — Encounter (INDEPENDENT_AMBULATORY_CARE_PROVIDER_SITE_OTHER): Payer: Self-pay | Admitting: Family Medicine

## 2021-03-18 ENCOUNTER — Ambulatory Visit (INDEPENDENT_AMBULATORY_CARE_PROVIDER_SITE_OTHER): Payer: No Typology Code available for payment source | Admitting: Family Medicine

## 2021-03-18 ENCOUNTER — Other Ambulatory Visit: Payer: Self-pay

## 2021-03-18 ENCOUNTER — Other Ambulatory Visit (HOSPITAL_BASED_OUTPATIENT_CLINIC_OR_DEPARTMENT_OTHER): Payer: Self-pay

## 2021-03-18 VITALS — BP 107/69 | HR 70 | Temp 98.1°F | Ht 69.0 in | Wt 224.0 lb

## 2021-03-18 DIAGNOSIS — Z6836 Body mass index (BMI) 36.0-36.9, adult: Secondary | ICD-10-CM | POA: Diagnosis not present

## 2021-03-18 DIAGNOSIS — R632 Polyphagia: Secondary | ICD-10-CM | POA: Diagnosis not present

## 2021-03-18 DIAGNOSIS — E559 Vitamin D deficiency, unspecified: Secondary | ICD-10-CM

## 2021-03-18 DIAGNOSIS — Z9189 Other specified personal risk factors, not elsewhere classified: Secondary | ICD-10-CM | POA: Diagnosis not present

## 2021-03-18 MED ORDER — WEGOVY 1 MG/0.5ML ~~LOC~~ SOAJ
1.0000 mg | SUBCUTANEOUS | 0 refills | Status: DC
  Filled 2021-03-18 – 2021-03-27 (×2): qty 2, 28d supply, fill #0

## 2021-03-19 NOTE — Progress Notes (Signed)
Chief Complaint:   OBESITY Brian Obrien is here to discuss his progress with his obesity treatment plan along with follow-up of his obesity related diagnoses. Brian Obrien is on following a lower carbohydrate, vegetable and lean protein rich diet plan and states he is following his eating plan approximately 80% of the time. Brian Obrien states he is weight training 15 minutes 3 times per week.  Today's visit was #: 14 Starting weight: 242 lbs Starting date: 06/25/2020 Today's weight: 224 lbs Today's date: 03/18/2021 Total lbs lost to date: 18 Total lbs lost since last in-office visit: 1  Interim History: Brian Obrien is working with a trainer 3 times a week. He has only been doing resistance training during Ramadan. He has eaten more carbs during Ramadan. He is changing jobs to [employer] (day shift). He believes this will improve his health overall and help him to lose weight. He has been working nights. He is unsure if he will have a gap on insurance coverage.  Subjective:   1. Polyphagia Brian Obrien is on Wegovy 0.5 mg. He notes dyspepsia has improved. Denies nausea and constipation.  2. Vitamin D deficiency Brian Obrien is on 5,000 IU OTC Vit D daily. His Vit D level is at goal.  3. At risk for side effect of medication Brian Obrien is at risk for side effects of medication due to increasing dose of Wegovy.  Assessment/Plan:   1. Polyphagia . Increase Wegovy to 1 mg weekly, as prescribed below.  - Semaglutide-Weight Management (WEGOVY) 1 MG/0.5ML SOAJ; Inject 1 mg into the skin once a week.  Dispense: 2 mL; Refill: 0  2. Vitamin D deficiency  He agrees to continue to take OTC Vitamin D @5 ,000 IU daily and will follow-up for routine testing of Vitamin D, at least 2-3 times per year to avoid over-replacement. Check Vit D level next month.  3. At risk for side effect of medication Brian Obrien was given approximately 15 minutes of drug side effect counseling today.  We discussed side effect possibility  and risk versus benefits. Brian Obrien agreed to the medication and will contact this office if these side effects are intolerable.  Repetitive spaced learning was employed today to elicit superior memory formation and behavioral change.  4. Obesity: BMI 33.06  Brian Obrien is currently in the action stage of change. As such, his goal is to continue with weight loss efforts. He has agreed to following a lower carbohydrate, vegetable and lean protein rich diet plan.   Exercise goals: Start back with cardio next week.  Behavioral modification strategies: decreasing simple carbohydrates.  Brian Obrien has agreed to follow-up with our clinic in 4 weeks.  Objective:   Blood pressure 107/69, pulse 70, temperature 98.1 F (36.7 C), height 5\' 9"  (1.753 m), weight 224 lb (101.6 kg), SpO2 97 %. Body mass index is 33.08 kg/m.  General: Cooperative, alert, well developed, in no acute distress. HEENT: Conjunctivae and lids unremarkable. Cardiovascular: Regular rhythm.  Lungs: Normal work of breathing. Neurologic: No focal deficits.   Lab Results  Component Value Date   CREATININE 0.82 02/03/2021   BUN 12 02/03/2021   NA 139 02/03/2021   K 4.2 02/03/2021   CL 102 02/03/2021   CO2 30 02/03/2021   Lab Results  Component Value Date   ALT 43 02/03/2021   AST 35 02/03/2021   ALKPHOS 48 02/03/2021   BILITOT 0.4 02/03/2021   Lab Results  Component Value Date   HGBA1C 5.2 11/04/2020   HGBA1C 5.4 06/25/2020   Lab Results  Component Value Date   INSULIN 17.6 11/04/2020   INSULIN 29.3 (H) 06/25/2020   Lab Results  Component Value Date   TSH 3.04 02/03/2021   Lab Results  Component Value Date   CHOL 173 02/03/2021   HDL 46.20 02/03/2021   LDLCALC 131 (H) 11/04/2020   LDLDIRECT 112.0 02/03/2021   TRIG 249.0 (H) 02/03/2021   CHOLHDL 4 02/03/2021   Lab Results  Component Value Date   WBC 6.4 02/03/2021   HGB 14.6 02/03/2021   HCT 43.3 02/03/2021   MCV 82.9 02/03/2021   PLT 291.0  02/03/2021   Lab Results  Component Value Date   IRON 68 02/03/2021    Attestation Statements:   Reviewed by clinician on day of visit: allergies, medications, problem list, medical history, surgical history, family history, social history, and previous encounter notes.  Edmund Hilda, am acting as Energy manager for [place], FNP.  I have reviewed the above documentation for accuracy and completeness, and I agree with the above. -  Jesse Sans, FNP

## 2021-03-24 ENCOUNTER — Encounter (INDEPENDENT_AMBULATORY_CARE_PROVIDER_SITE_OTHER): Payer: Self-pay | Admitting: Family Medicine

## 2021-03-24 DIAGNOSIS — R632 Polyphagia: Secondary | ICD-10-CM | POA: Insufficient documentation

## 2021-03-26 ENCOUNTER — Other Ambulatory Visit (HOSPITAL_BASED_OUTPATIENT_CLINIC_OR_DEPARTMENT_OTHER): Payer: Self-pay

## 2021-03-27 ENCOUNTER — Other Ambulatory Visit (HOSPITAL_BASED_OUTPATIENT_CLINIC_OR_DEPARTMENT_OTHER): Payer: Self-pay

## 2021-03-29 IMAGING — DX DG CERVICAL SPINE COMPLETE 4+V
6 series · 6 of 6 positions shown · non-contrast
Comparison: None.

CLINICAL DATA: Mid to lower neck pain for several months.

EXAM:
CERVICAL SPINE - COMPLETE 4+ VIEW

[c-spine lat]
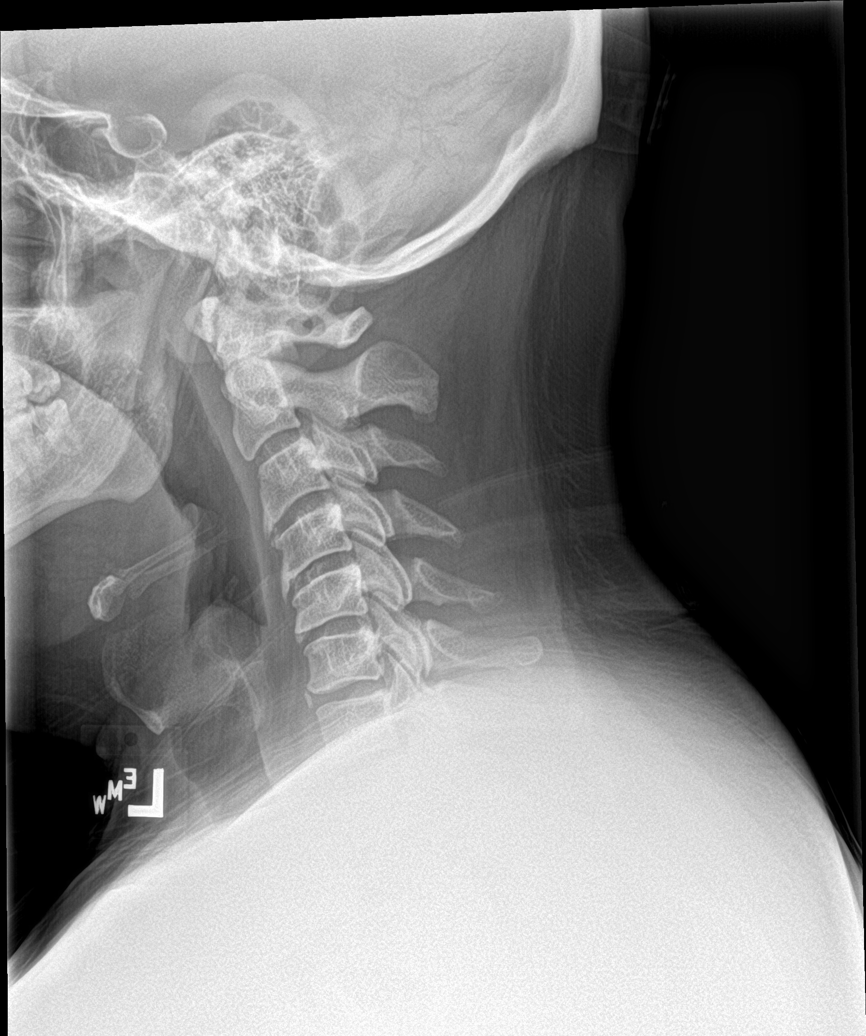

[c-spine obl (1 of 2)]
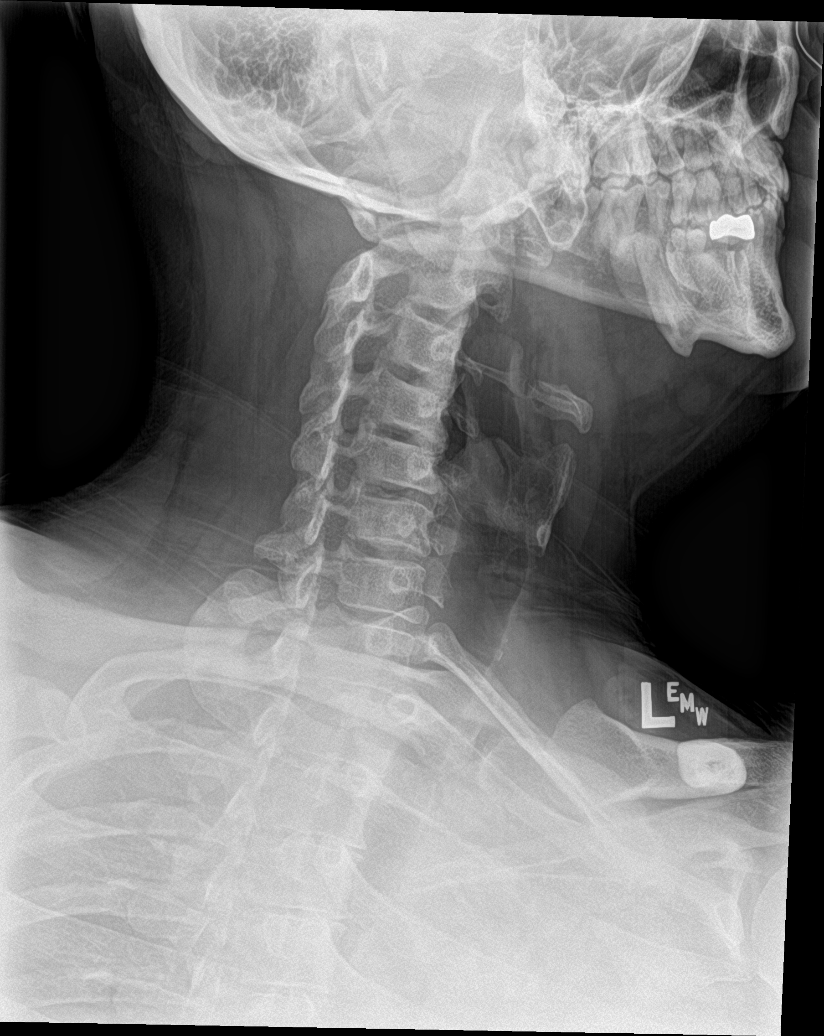

[c-spine obl (2 of 2)]
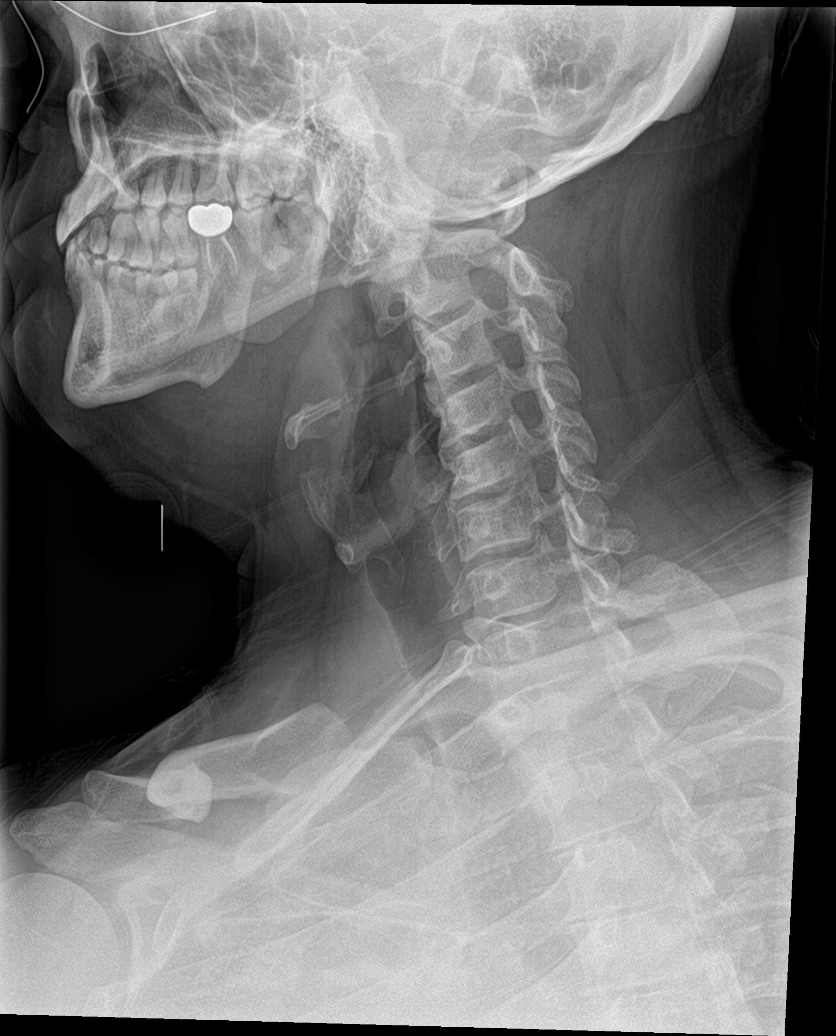

[c-spine ap]
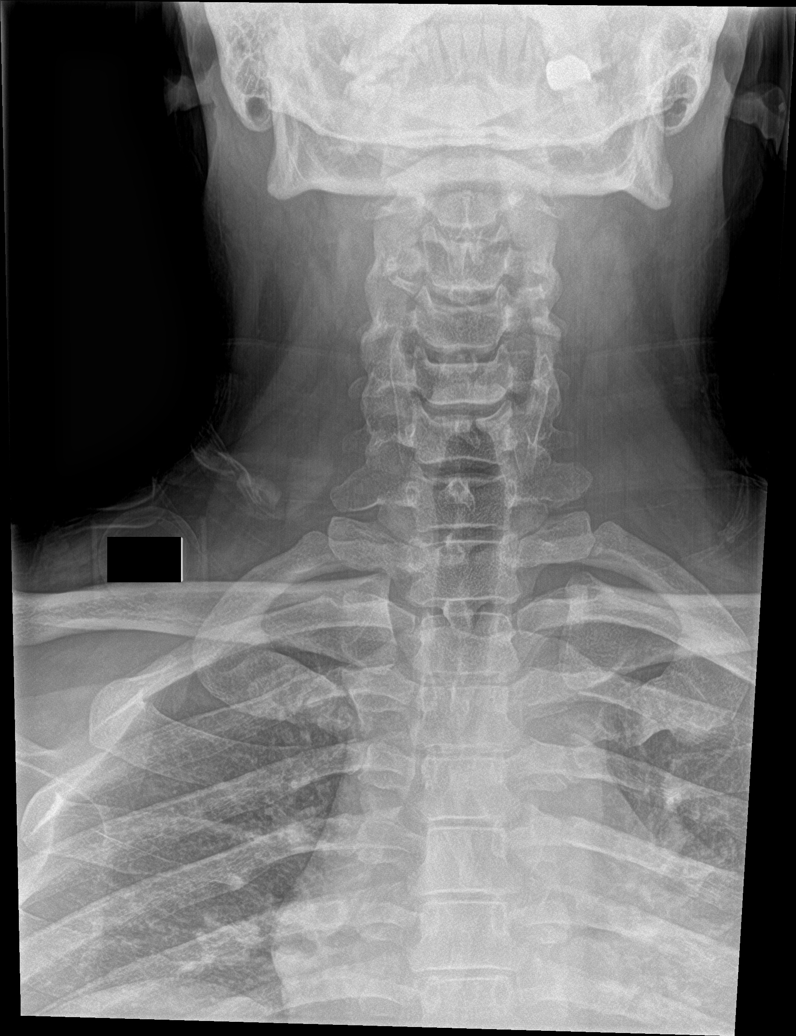

[c-spine open mouth]
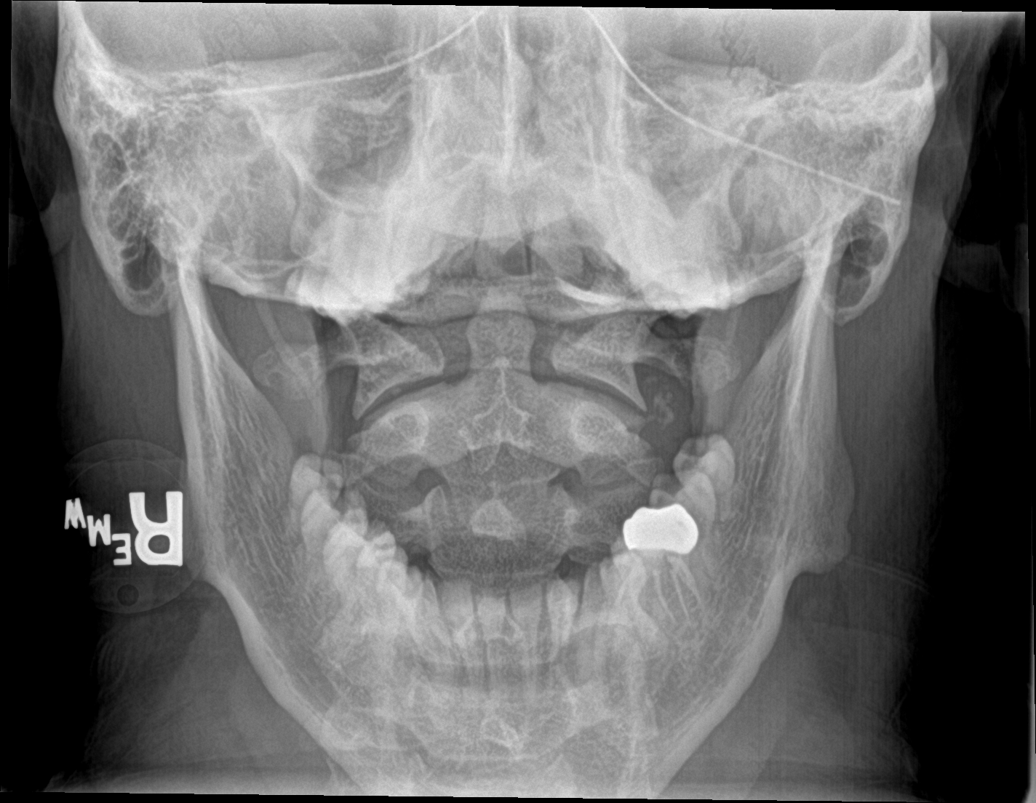

[c-spine swimmers]
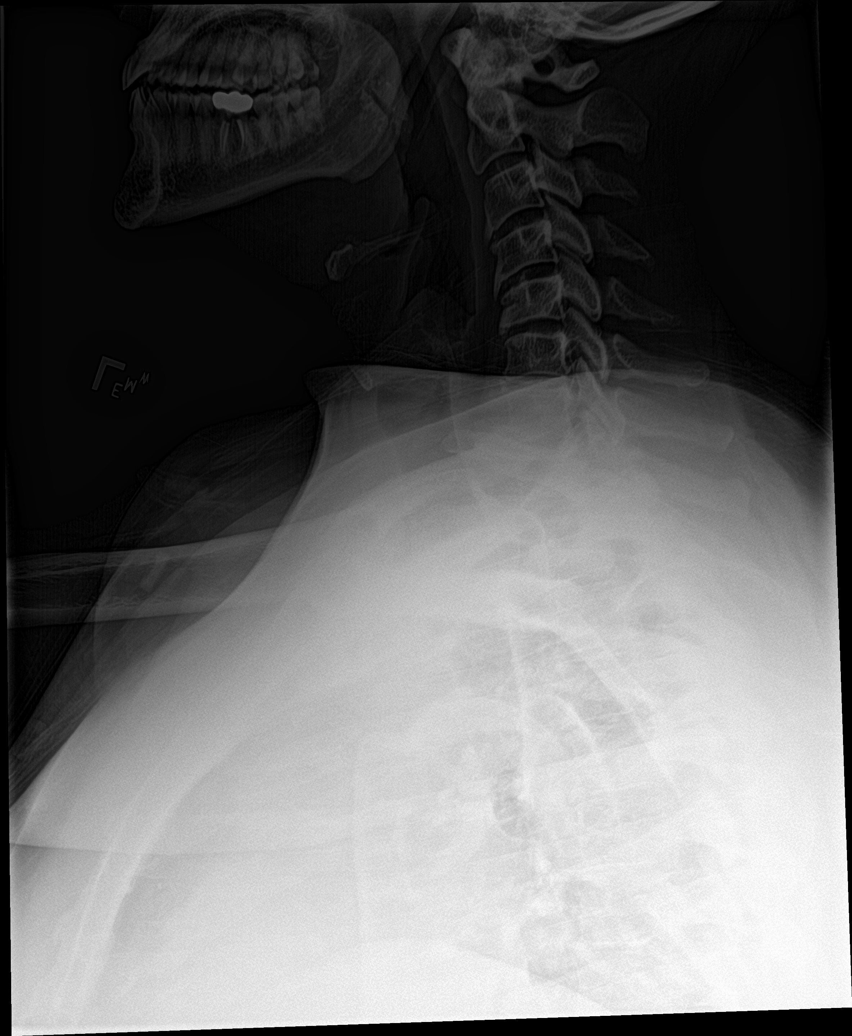

[6 of 6 positions shown; findings below may reference images not displayed]

FINDINGS: No evidence of an acute fracture. No subluxation. Reversal of normal
cervical lordosis evident. Mild anterior spurring noted at multiple
mid cervical levels. Oblique views show no bony neuroforaminal
encroachment. There is no prevertebral soft tissue swelling.
IMPRESSION: 1. No evidence for cervical spine fracture.
2. Loss of cervical lordosis. This can be related to patient
positioning, muscle spasm or soft tissue injury.

## 2021-04-02 ENCOUNTER — Encounter: Payer: Self-pay | Admitting: Endocrinology

## 2021-04-02 ENCOUNTER — Ambulatory Visit (INDEPENDENT_AMBULATORY_CARE_PROVIDER_SITE_OTHER): Payer: No Typology Code available for payment source | Admitting: Endocrinology

## 2021-04-02 ENCOUNTER — Other Ambulatory Visit: Payer: Self-pay

## 2021-04-02 DIAGNOSIS — R7989 Other specified abnormal findings of blood chemistry: Secondary | ICD-10-CM | POA: Diagnosis not present
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
## 2021-04-02 NOTE — Patient Instructions (Addendum)
Blood tests are requested for you today.  We'll let you know about the results.   Based on the results, I hope to be able to prescribe for you the clomiphene pill.   Testosterone treatment has risks, including increased or decreased fertility (depending on the type of treatment), hair loss, blood clots, liver problems, lower hdl ("good cholesterol"), polycythemia (opposite of anemia), worsening of sleep apnea, and behavior changes.   Please come back for a follow-up appointment in 1 year.

## 2021-04-03 LAB — PROLACTIN: Prolactin: 6 ng/mL (ref 2.0–18.0)

## 2021-04-03 LAB — LUTEINIZING HORMONE: LH: 6.17 m[IU]/mL (ref 1.50–9.30)

## 2021-04-05 LAB — TESTOSTERONE,FREE AND TOTAL
Testosterone, Free: 16.3 pg/mL (ref 8.7–25.1)
Testosterone: 285 ng/dL (ref 264–916)

## 2021-04-21 ENCOUNTER — Encounter (INDEPENDENT_AMBULATORY_CARE_PROVIDER_SITE_OTHER): Payer: Self-pay | Admitting: Family Medicine

## 2021-04-22 ENCOUNTER — Ambulatory Visit (INDEPENDENT_AMBULATORY_CARE_PROVIDER_SITE_OTHER): Payer: No Typology Code available for payment source | Admitting: Family Medicine

## 2021-05-14 DIAGNOSIS — S4992XA Unspecified injury of left shoulder and upper arm, initial encounter: Secondary | ICD-10-CM | POA: Insufficient documentation

## 2021-05-20 ENCOUNTER — Other Ambulatory Visit: Payer: Self-pay | Admitting: Orthopedic Surgery

## 2021-05-20 DIAGNOSIS — M25569 Pain in unspecified knee: Secondary | ICD-10-CM

## 2021-05-22 ENCOUNTER — Other Ambulatory Visit (HOSPITAL_BASED_OUTPATIENT_CLINIC_OR_DEPARTMENT_OTHER): Payer: Self-pay

## 2021-05-22 HISTORY — PX: OTHER SURGICAL HISTORY: SHX169

## 2021-05-22 MED ORDER — METHYLPREDNISOLONE 4 MG PO TBPK
ORAL_TABLET | ORAL | 0 refills | Status: DC
  Filled 2021-05-22: qty 21, 6d supply, fill #0

## 2021-05-24 ENCOUNTER — Other Ambulatory Visit: Payer: No Typology Code available for payment source

## 2021-06-01 ENCOUNTER — Ambulatory Visit (INDEPENDENT_AMBULATORY_CARE_PROVIDER_SITE_OTHER): Payer: Managed Care, Other (non HMO) | Admitting: Medical

## 2021-06-01 ENCOUNTER — Other Ambulatory Visit: Payer: Self-pay

## 2021-06-01 ENCOUNTER — Other Ambulatory Visit (HOSPITAL_BASED_OUTPATIENT_CLINIC_OR_DEPARTMENT_OTHER): Payer: Self-pay

## 2021-06-01 ENCOUNTER — Encounter: Payer: Self-pay | Admitting: Medical

## 2021-06-01 VITALS — BP 130/80 | HR 100 | Resp 18 | Ht 69.0 in | Wt 224.0 lb

## 2021-06-01 DIAGNOSIS — M25512 Pain in left shoulder: Secondary | ICD-10-CM

## 2021-06-01 DIAGNOSIS — G8929 Other chronic pain: Secondary | ICD-10-CM

## 2021-06-01 DIAGNOSIS — M545 Low back pain, unspecified: Secondary | ICD-10-CM | POA: Diagnosis not present

## 2021-06-01 DIAGNOSIS — M546 Pain in thoracic spine: Secondary | ICD-10-CM | POA: Diagnosis not present

## 2021-06-01 DIAGNOSIS — M25561 Pain in right knee: Secondary | ICD-10-CM | POA: Diagnosis not present

## 2021-06-01 DIAGNOSIS — M25562 Pain in left knee: Secondary | ICD-10-CM

## 2021-06-01 MED ORDER — CYCLOBENZAPRINE HCL 10 MG PO TABS
10.0000 mg | ORAL_TABLET | Freq: Every day | ORAL | 0 refills | Status: DC
  Filled 2021-06-01: qty 15, 15d supply, fill #0

## 2021-06-01 NOTE — Patient Instructions (Addendum)
For shoulder pain, back pain and knee pain recommend that you use low dose ibuprofen and tylenol combination for pain. Also can use flexeril 10 mg tab at night.   Your xrays were negative of back and knees.. If back pain does not taper off could refer to sports medicine.   Upcoming surgery for shoulder A/C separation.   Follow up in 3 weeks or as needed
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
## 2021-06-05 ENCOUNTER — Encounter (HOSPITAL_BASED_OUTPATIENT_CLINIC_OR_DEPARTMENT_OTHER): Payer: Self-pay

## 2021-06-05 ENCOUNTER — Other Ambulatory Visit: Payer: Self-pay

## 2021-06-05 ENCOUNTER — Emergency Department (HOSPITAL_BASED_OUTPATIENT_CLINIC_OR_DEPARTMENT_OTHER)
Admission: EM | Admit: 2021-06-05 | Discharge: 2021-06-05 | Disposition: A | Discharge status: Home / Self Care | Payer: Managed Care, Other (non HMO) | Attending: Emergency Medicine | Admitting: Emergency Medicine

## 2021-06-05 ENCOUNTER — Emergency Department (HOSPITAL_BASED_OUTPATIENT_CLINIC_OR_DEPARTMENT_OTHER): Payer: Managed Care, Other (non HMO)

## 2021-06-05 ENCOUNTER — Telehealth: Payer: Managed Care, Other (non HMO) | Admitting: Physician Assistant

## 2021-06-05 ENCOUNTER — Encounter: Payer: Self-pay | Admitting: Physician Assistant

## 2021-06-05 DIAGNOSIS — R42 Dizziness and giddiness: Secondary | ICD-10-CM | POA: Insufficient documentation

## 2021-06-05 DIAGNOSIS — Z87891 Personal history of nicotine dependence: Secondary | ICD-10-CM | POA: Diagnosis not present

## 2021-06-05 DIAGNOSIS — R110 Nausea: Secondary | ICD-10-CM | POA: Diagnosis not present

## 2021-06-05 DIAGNOSIS — H81399 Other peripheral vertigo, unspecified ear: Secondary | ICD-10-CM

## 2021-06-05 LAB — BASIC METABOLIC PANEL
Anion gap: 8 (ref 5–15)
BUN: 9 mg/dL (ref 6–20)
CO2: 27 mmol/L (ref 22–32)
Calcium: 9.4 mg/dL (ref 8.9–10.3)
Chloride: 103 mmol/L (ref 98–111)
Creatinine, Ser: 0.6 mg/dL — ABNORMAL LOW (ref 0.61–1.24)
GFR, Estimated: >60 mL/min (ref >60)
Glucose, Bld: 88 mg/dL (ref 70–99)
Potassium: 3.7 mmol/L (ref 3.5–5.1)
Sodium: 138 mmol/L (ref 135–145)

## 2021-06-05 LAB — CBC
HCT: 42.8 % (ref 39.0–52.0)
Hemoglobin: 14.2 g/dL (ref 13.0–17.0)
MCH: 28 pg (ref 26.0–34.0)
MCHC: 33.2 g/dL (ref 30.0–36.0)
MCV: 84.3 fL (ref 80.0–100.0)
Platelets: 345 10*3/uL (ref 150–400)
RBC: 5.08 MIL/uL (ref 4.22–5.81)
RDW: 12 % (ref 11.5–15.5)
WBC: 6.9 10*3/uL (ref 4.0–10.5)
nRBC: 0 % (ref 0.0–0.2)

## 2021-06-05 MED ORDER — MECLIZINE HCL 25 MG PO TABS
25.0000 mg | ORAL_TABLET | Freq: Once | ORAL | Status: AC
  Administered 2021-06-05: 25 mg via ORAL
  Filled 2021-06-05: qty 1

## 2021-06-05 MED ORDER — MECLIZINE HCL 25 MG PO TABS: 25.0000 mg | ORAL_TABLET | Freq: Three times a day (TID) | ORAL | 0 refills | Status: DC | PRN

## 2021-06-05 MED ORDER — ONDANSETRON 4 MG PO TBDP
8.0000 mg | ORAL_TABLET | Freq: Once | ORAL | Status: AC
  Administered 2021-06-05: 8 mg via ORAL
  Filled 2021-06-05: qty 2

## 2021-06-05 NOTE — Discharge Instructions (Addendum)
Take the medication as needed for dizziness.

## 2021-06-05 NOTE — ED Triage Notes (Addendum)
Pt c/o dizziness , vision changes started ~2am-"felt like the room was spinning"-dizziness worse when moves head-sx started ~30 min after taking melatonin, flexeril and gabapentin-did have a HA to back of head prior to sx-NAD-to triage in w/c
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
## 2021-06-05 NOTE — ED Notes (Signed)
Pt taken to CT via wheelchair. Tolerated well 

## 2021-06-05 NOTE — Patient Instructions (Signed)
1. Dizziness  - could be benign, but given recent head trauma and inability to perform a full neurologic exam referral to the ED was made.
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
## 2021-06-11 ENCOUNTER — Other Ambulatory Visit (HOSPITAL_BASED_OUTPATIENT_CLINIC_OR_DEPARTMENT_OTHER): Payer: Self-pay

## 2021-06-11 MED ORDER — ONDANSETRON HCL 4 MG PO TABS
ORAL_TABLET | ORAL | 0 refills | Status: DC
  Filled 2021-06-11: qty 10, 3d supply, fill #0

## 2021-06-11 MED ORDER — HYDROCODONE-ACETAMINOPHEN 5-325 MG PO TABS
ORAL_TABLET | ORAL | 0 refills | Status: DC
  Filled 2021-06-11: qty 30, 5d supply, fill #0

## 2021-06-18 ENCOUNTER — Ambulatory Visit
Admission: RE | Admit: 2021-06-18 | Discharge: 2021-06-18 | Disposition: A | Discharge status: Home / Self Care | Payer: Managed Care, Other (non HMO) | Source: Ambulatory Visit | Attending: Orthopedic Surgery | Admitting: Orthopedic Surgery

## 2021-06-18 ENCOUNTER — Other Ambulatory Visit: Payer: Self-pay

## 2021-06-18 DIAGNOSIS — M25569 Pain in unspecified knee: Secondary | ICD-10-CM

## 2021-06-21 ENCOUNTER — Other Ambulatory Visit: Payer: Managed Care, Other (non HMO)

## 2021-06-22 ENCOUNTER — Other Ambulatory Visit (HOSPITAL_BASED_OUTPATIENT_CLINIC_OR_DEPARTMENT_OTHER): Payer: Self-pay

## 2021-06-22 ENCOUNTER — Other Ambulatory Visit (HOSPITAL_COMMUNITY): Payer: Self-pay

## 2021-06-22 MED ORDER — MOXIFLOXACIN HCL 400 MG PO TABS
400.0000 mg | ORAL_TABLET | Freq: Every day | ORAL | 0 refills | Status: DC
  Filled 2021-06-22 (×3): qty 10, 10d supply, fill #0

## 2021-06-22 MED ORDER — SULFAMETHOXAZOLE-TRIMETHOPRIM 400-80 MG PO TABS
1.0000 | ORAL_TABLET | Freq: Two times a day (BID) | ORAL | 0 refills | Status: DC
  Filled 2021-06-22 (×3): qty 20, 10d supply, fill #0

## 2021-06-23 ENCOUNTER — Other Ambulatory Visit (HOSPITAL_COMMUNITY): Payer: Self-pay

## 2021-06-23 ENCOUNTER — Other Ambulatory Visit (HOSPITAL_BASED_OUTPATIENT_CLINIC_OR_DEPARTMENT_OTHER): Payer: Self-pay

## 2021-06-23 MED ORDER — SULFAMETHOXAZOLE-TRIMETHOPRIM 800-160 MG PO TABS
1.0000 | ORAL_TABLET | Freq: Two times a day (BID) | ORAL | 0 refills | Status: DC
  Filled 2021-06-23: qty 20, 10d supply, fill #0

## 2021-06-26 ENCOUNTER — Other Ambulatory Visit: Payer: Self-pay

## 2021-06-26 ENCOUNTER — Encounter: Payer: Self-pay | Admitting: Medical

## 2021-06-26 ENCOUNTER — Ambulatory Visit (INDEPENDENT_AMBULATORY_CARE_PROVIDER_SITE_OTHER): Payer: Managed Care, Other (non HMO) | Admitting: Medical

## 2021-06-26 VITALS — BP 115/78 | HR 90 | Resp 18 | Ht 69.0 in | Wt 218.6 lb

## 2021-06-26 DIAGNOSIS — M25561 Pain in right knee: Secondary | ICD-10-CM

## 2021-06-26 DIAGNOSIS — R42 Dizziness and giddiness: Secondary | ICD-10-CM | POA: Diagnosis not present

## 2021-06-26 DIAGNOSIS — M25562 Pain in left knee: Secondary | ICD-10-CM | POA: Diagnosis not present
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
## 2021-06-26 NOTE — Patient Instructions (Signed)
You describe vertigo 3 weeks ago and now having intermittent dizziness on changing position.  You have normal neurologic exam presently.  CT head was negative in the ED and you had normal labs.  Since you are significantly improved and gradually getting better recommend continuing meclizine.  Presently more symptoms are brief when you change position.  Sometimes blood pressure changes can contribute to dizzy sensation with position changes.  Get your balance before ambulating.  If you have recurrent vertigo then recommend dong Epley maneuvers.  If with Epley maneuvers your symptoms do not improve let me know and would refer you for physical therapy.  If you have any dizziness or vertigo with gross motor or sensory function deficits then recommend ED evaluation.  For bilateral knee pain went ahead and refer to sports medicine for opinion on conservative treatment versus surgery.  Follow-up in 1 month or sooner if needed.

## 2021-07-01 ENCOUNTER — Other Ambulatory Visit (HOSPITAL_BASED_OUTPATIENT_CLINIC_OR_DEPARTMENT_OTHER): Payer: Self-pay

## 2021-07-01 MED ORDER — SULFAMETHOXAZOLE-TRIMETHOPRIM 800-160 MG PO TABS
1.0000 | ORAL_TABLET | Freq: Two times a day (BID) | ORAL | 0 refills | Status: DC
  Filled 2021-07-01: qty 6, 3d supply, fill #0

## 2021-07-07 ENCOUNTER — Ambulatory Visit (INDEPENDENT_AMBULATORY_CARE_PROVIDER_SITE_OTHER): Payer: Managed Care, Other (non HMO) | Admitting: Family Medicine

## 2021-07-07 ENCOUNTER — Encounter: Payer: Self-pay | Admitting: Family Medicine

## 2021-07-07 ENCOUNTER — Other Ambulatory Visit: Payer: Self-pay

## 2021-07-07 VITALS — BP 124/80 | Ht 70.0 in | Wt 220.0 lb

## 2021-07-07 DIAGNOSIS — S83522A Sprain of posterior cruciate ligament of left knee, initial encounter: Secondary | ICD-10-CM

## 2021-07-07 DIAGNOSIS — S83421A Sprain of lateral collateral ligament of right knee, initial encounter: Secondary | ICD-10-CM

## 2021-07-07 DIAGNOSIS — F419 Anxiety disorder, unspecified: Secondary | ICD-10-CM

## 2021-07-07 DIAGNOSIS — S83511A Sprain of anterior cruciate ligament of right knee, initial encounter: Secondary | ICD-10-CM

## 2021-07-07 NOTE — Patient Instructions (Signed)
Good to see you Please try physical therapy  Please try the EMDR   Please send me a message in MyChart with any questions or updates.  Please see me back in 4 weeks .   --Dr. Jordan Likes
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
## 2021-07-08 DIAGNOSIS — S83522D Sprain of posterior cruciate ligament of left knee, subsequent encounter: Secondary | ICD-10-CM | POA: Insufficient documentation

## 2021-07-08 DIAGNOSIS — S83522A Sprain of posterior cruciate ligament of left knee, initial encounter: Secondary | ICD-10-CM | POA: Insufficient documentation

## 2021-07-08 DIAGNOSIS — S83421A Sprain of lateral collateral ligament of right knee, initial encounter: Secondary | ICD-10-CM | POA: Insufficient documentation

## 2021-07-08 DIAGNOSIS — F419 Anxiety disorder, unspecified: Secondary | ICD-10-CM | POA: Insufficient documentation

## 2021-07-08 DIAGNOSIS — S83511A Sprain of anterior cruciate ligament of right knee, initial encounter: Secondary | ICD-10-CM | POA: Insufficient documentation

## 2021-07-08 NOTE — Assessment & Plan Note (Signed)
Initial injury on 6/19.  Has a complete tear of the LCL to accompany the ACL tear in the same knee. -Counseled on home exercise therapy and supportive care. -Continue hinged knee brace. -Referral to orthopedic surgery.
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
## 2021-07-22 ENCOUNTER — Other Ambulatory Visit (HOSPITAL_BASED_OUTPATIENT_CLINIC_OR_DEPARTMENT_OTHER): Payer: Self-pay

## 2021-07-22 MED ORDER — DOXYCYCLINE HYCLATE 100 MG PO TABS
100.0000 mg | ORAL_TABLET | Freq: Two times a day (BID) | ORAL | 0 refills | Status: DC
  Filled 2021-07-22: qty 20, 10d supply, fill #0

## 2021-07-23 HISTORY — PX: KNEE SURGERY: SHX244

## 2021-07-29 ENCOUNTER — Ambulatory Visit (INDEPENDENT_AMBULATORY_CARE_PROVIDER_SITE_OTHER): Payer: Managed Care, Other (non HMO) | Admitting: Rehabilitative and Restorative Service Providers"

## 2021-07-29 ENCOUNTER — Other Ambulatory Visit: Payer: Self-pay

## 2021-07-29 DIAGNOSIS — M25512 Pain in left shoulder: Secondary | ICD-10-CM

## 2021-07-29 DIAGNOSIS — R600 Localized edema: Secondary | ICD-10-CM | POA: Diagnosis not present

## 2021-07-29 DIAGNOSIS — M6281 Muscle weakness (generalized): Secondary | ICD-10-CM
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
## 2021-07-29 NOTE — Patient Instructions (Signed)
Access Code: [email or web address] URL: [email or web address] Date: 07/29/2021 Prepared by: Margretta Ditty  Exercises Circular Shoulder Pendulum with Table Support - 2 x daily - 7 x weekly - 1 sets - 10 reps Supine Shoulder Flexion Extension AAROM with Dowel - 2 x daily - 7 x weekly - 1 sets - 5 reps Supine Shoulder Press with Dowel - 2 x daily - 7 x weekly - 1 sets - 5 reps Supine Shoulder External Rotation with Dowel - 2 x daily - 7 x weekly - 1 sets - 10 reps Seated Shoulder Flexion Slide at Table Top with Forearm in Neutral - 2 x daily - 7 x weekly - 1 sets - 5 reps Seated Shoulder Scaption Slide at Table Top with Forearm in Neutral - 2 x daily - 7 x weekly - 1 sets - 5 reps

## 2021-08-03 ENCOUNTER — Encounter: Payer: Managed Care, Other (non HMO) | Admitting: Physical Therapy

## 2021-08-05 ENCOUNTER — Encounter: Payer: Managed Care, Other (non HMO) | Admitting: Physical Therapy

## 2021-08-10 ENCOUNTER — Encounter: Payer: Managed Care, Other (non HMO) | Admitting: Physical Therapy

## 2021-08-12 ENCOUNTER — Encounter: Payer: Managed Care, Other (non HMO) | Admitting: Physical Therapy

## 2021-08-12 ENCOUNTER — Ambulatory Visit (INDEPENDENT_AMBULATORY_CARE_PROVIDER_SITE_OTHER): Payer: Managed Care, Other (non HMO) | Admitting: Family Medicine

## 2021-08-12 ENCOUNTER — Other Ambulatory Visit: Payer: Self-pay

## 2021-08-12 ENCOUNTER — Encounter: Payer: Self-pay | Admitting: Family Medicine

## 2021-08-12 DIAGNOSIS — S83522D Sprain of posterior cruciate ligament of left knee, subsequent encounter: Secondary | ICD-10-CM | POA: Diagnosis not present

## 2021-08-12 DIAGNOSIS — S83511D Sprain of anterior cruciate ligament of right knee, subsequent encounter: Secondary | ICD-10-CM | POA: Diagnosis not present

## 2021-08-12 NOTE — Progress Notes (Signed)
Brian Obrien - 35 y.o. male MRN [id number]  Date of birth: 06-19-1986  SUBJECTIVE:  Including CC & ROS.  No chief complaint on file.   Brian Obrien is a 35 y.o. male that is following up after his left and right knee injury.  He has started physical therapy and is doing well.   Review of Systems See HPI   HISTORY: Past Medical, Surgical, Social, and Family History Reviewed & Updated per EMR.   Pertinent Historical Findings include:  Past Medical History:  Diagnosis Date   Allergy    Anxiety    Chest pain    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   Chronic neck pain 11/12/2019   Chronic pain of left knee 11/12/2019   Dependence on nicotine from chewing tobacco 12/15/2017   Depression    Fatty liver    GERD (gastroesophageal reflux disease)    Hyperlipidemia    Left hip pain    Left sided sciatica 03/15/2018   Lytic bone lesions on xray 02/07/2018   Formatting of this note might be different from the original. Of right iliac crest.  Seen on x-ray in 1-19 and CT on 02/05/2018. Unchanged xray in 03/2018. Repeat in 6 months (CT vs Xray)   Metabolic syndrome 12/25/2019   Obesity (BMI 30-39.9) 12/15/2017   OSA (obstructive sleep apnea) 12/15/2017   2015; unable to tolerate CPAP   Palpitations    Severe obesity (BMI 35.0-39.9) with comorbidity (HCC) 11/12/2019   Sleep apnea    SOB (shortness of breath)    Vitamin B 12 deficiency    Vitamin D deficiency     Past Surgical History:  Procedure Laterality Date   HAIR TRANSPLANT      Family History  Problem Relation Age of Onset   COPD Father    Heart disease Father    Liver disease Father    Depression Father    Diabetes Mother    High blood pressure Mother    High Cholesterol Mother    Kidney disease Mother    Thyroid disease Mother    Sleep apnea Mother    Obesity Mother    Other Brother        low testosterone    Social History   Socioeconomic History   Marital status: Single     Spouse name: Not on file   Number of children: Not on file   Years of education: Not on file   Highest education level: Not on file  Occupational History   Occupation: mri tech  Tobacco Use   Smoking status: Former    Packs/day: 0.25    Types: Cigarettes    Quit date: 03/06/2012    Years since quitting: 9.4   Smokeless tobacco: Former    Types: Chew    Quit date: 03/17/2016  Vaping Use   Vaping Use: Never used  Substance and Sexual Activity   Alcohol use: Not Currently   Drug use: Never   Sexual activity: Not on file  Other Topics Concern   Not on file  Social History Narrative   Not on file   Social Determinants of Health   Financial Resource Strain: Not on file  Food Insecurity: Not on file  Transportation Needs: Not on file  Physical Activity: Not on file  Stress: Not on file  Social Connections: Not on file  Intimate Partner Violence: Not on file     PHYSICAL EXAM:  VS: Ht 5\' 10"  (1.778 m)   Wt  220 lb (99.8 kg)   BMI 31.57 kg/m  Physical Exam Gen: NAD, alert, cooperative with exam, well-appearing      ASSESSMENT & PLAN:   Rupture of anterior cruciate ligament of right knee Has felt well in the right knee and seems to be pretty stable based on his therapy so far. -Counseled on home exercise therapy and supportive care. -Can continue with physical therapy. -Could consider PRP in the future.  Tear of PCL (posterior cruciate ligament) of knee, left, subsequent encounter Has mild laxity within the left knee.  Has started physical therapy as well. -Counseled on home exercise therapy and supportive care. -Continue physical therapy. -Has follow-up with the surgeon scheduled.

## 2021-08-12 NOTE — Assessment & Plan Note (Signed)
Has felt well in the right knee and seems to be pretty stable based on his therapy so far. -Counseled on home exercise therapy and supportive care. -Can continue with physical therapy. -Could consider PRP in the future.

## 2021-08-12 NOTE — Assessment & Plan Note (Signed)
Has mild laxity within the left knee.  Has started physical therapy as well. -Counseled on home exercise therapy and supportive care. -Continue physical therapy. -Has follow-up with the surgeon scheduled.

## 2021-08-17 ENCOUNTER — Encounter: Payer: Managed Care, Other (non HMO) | Admitting: Rehabilitative and Restorative Service Providers"

## 2021-08-19 ENCOUNTER — Encounter: Payer: Managed Care, Other (non HMO) | Admitting: Physical Therapy

## 2021-09-09 ENCOUNTER — Telehealth: Payer: Managed Care, Other (non HMO) | Admitting: Nurse Practitioner

## 2021-09-09 DIAGNOSIS — R519 Headache, unspecified: Secondary | ICD-10-CM

## 2021-09-09 DIAGNOSIS — J029 Acute pharyngitis, unspecified: Secondary | ICD-10-CM | POA: Diagnosis not present

## 2021-09-09 NOTE — Patient Instructions (Signed)
Pharyngitis ?Pharyngitis is a sore throat (pharynx). This is when there is redness, pain, and swelling in your throat. Most of the time, this condition gets better on its own. In some cases, you may need medicine. ?What are the causes? ?An infection from a virus. ?An infection from bacteria. ?Allergies. ?What increases the risk? ?Being 5-33 years old. ?Being in crowded environments. These include: ?Daycares. ?Schools. ?Dormitories. ?Living in a place with cold temperatures outside. ?Having a weakened disease-fighting (immune) system. ?What are the signs or symptoms? ?Symptoms may vary depending on the cause. Common symptoms include: ?Sore throat. ?Tiredness (fatigue). ?Low-grade fever. ?Stuffy nose. ?Cough. ?Headache. ?Other symptoms may include: ?Glands in the neck (lymph nodes) that are swollen. ?Skin rashes. ?Film on the throat or tonsils. This can be caused by an infection from bacteria. ?Vomiting. ?Red, itchy eyes. ?Loss of appetite. ?Joint pain and muscle aches. ?Tonsils that are temporarily bigger than usual (enlarged). ?How is this treated? ?Many times, treatment is not needed. This condition usually gets better in 3-4 days without treatment. ?If the infection is caused by a bacteria, you may be need to take antibiotics. ?Follow these instructions at home: ?Medicines ?Take over-the-counter and prescription medicines only as told by your doctor. ?If you were prescribed an antibiotic medicine, take it as told by your doctor. Do not stop taking the antibiotic even if you start to feel better. ?Use throat lozenges or sprays to soothe your throat as told by your doctor. ?Children can get pharyngitis. Do not give your child aspirin. ?Managing pain ?To help with pain, try: ?Sipping warm liquids, such as: ?Broth. ?Herbal tea. ?Warm water. ?Eating or drinking cold or frozen liquids, such as frozen ice pops. ?Rinsing your mouth (gargle) with a salt water mixture 3-4 times a day or as needed. ?To make salt water,  dissolve ?-1 tsp (3-6 g) of salt in 1 cup (237 mL) of warm water. ?Do not swallow this mixture. ?Sucking on hard candy or throat lozenges. ?Putting a cool-mist humidifier in your bedroom at night to moisten the air. ?Sitting in the bathroom with the door closed for 5-10 minutes while you run hot water in the shower. ? ?General instructions ? ?Do not smoke or use any products that contain nicotine or tobacco. If you need help quitting, ask your doctor. ?Rest as told by your doctor. ?Drink enough fluid to keep your pee (urine) pale yellow. ?How is this prevented? ?Wash your hands often for at least 20 seconds with soap and water. If soap and water are not available, use hand sanitizer. ?Do not touch your eyes, nose, or mouth with unwashed hands. Wash hands after touching these areas. ?Do not share cups or eating utensils. ?Avoid close contact with people who are sick. ?Contact a doctor if: ?You have large, tender lumps in your neck. ?You have a rash. ?You cough up green, yellow-brown, or bloody spit. ?Get help right away if: ?You have a stiff neck. ?You drool or cannot swallow liquids. ?You cannot drink or take medicines without vomiting. ?You have very bad pain that does not go away with medicine. ?You have problems breathing, and it is not from a stuffy nose. ?You have new pain and swelling in your knees, ankles, wrists, or elbows. ?These symptoms may be an emergency. Get help right away. Call your local emergency services (911 in the [place] ?Do not wait to see if the symptoms will go away. ?Do not drive yourself to the hospital. ?Summary ?Pharyngitis is a sore throat (pharynx). This is   when there is redness, pain, and swelling in your throat. ?Most of the time, pharyngitis gets better on its own. Sometimes, you may need medicine. ?If you were prescribed an antibiotic medicine, take it as told by your doctor. Do not stop taking the antibiotic even if you start to feel better. ?This information is not intended to  replace advice given to you by your health care provider. Make sure you discuss any questions you have with your health care provider. ?Document Revised: 02/04/2021 Document Reviewed: 02/04/2021 ?Elsevier Patient Education ? 2022 [employer] ? ?

## 2021-09-09 NOTE — Progress Notes (Signed)
Virtual Visit Consent   [name], you are scheduled for a virtual visit with [name], FNP, a [facility] provider, today.     Just as with appointments in the office, your consent must be obtained to participate.  Your consent will be active for this visit and any virtual visit you may have with one of our providers in the next 365 days.     If you have a MyChart account, a copy of this consent can be sent to you electronically.  All virtual visits are billed to your insurance company just like a traditional visit in the office.    As this is a virtual visit, video technology does not allow for your provider to perform a traditional examination.  This may limit your provider's ability to fully assess your condition.  If your provider identifies any concerns that need to be evaluated in person or the need to arrange testing (such as labs, EKG, etc.), we will make arrangements to do so.     Although advances in technology are sophisticated, we cannot ensure that it will always work on either your end or our end.  If the connection with a video visit is poor, the visit may have to be switched to a telephone visit.  With either a video or telephone visit, we are not always able to ensure that we have a secure connection.     I need to obtain your verbal consent now.   Are you willing to proceed with your visit today? YES   Brian Obrien has provided verbal consent on 09/09/2021 for a virtual visit (video or telephone).   [name], FNP   Date: 09/09/2021 8:22 AM   Virtual Visit via Video Note   I, [name], connected with Brian Obrien ([id number], 1986/08/07) on 09/09/21 at  8:30 AM EDT by a video-enabled telemedicine application and verified that I am speaking with the correct person using two identifiers.  Location: Patient: Virtual Visit Location Patient: Home Provider: Virtual Visit Location Provider: Mobile   I discussed the limitations  of evaluation and management by telemedicine and the availability of in person appointments. The patient expressed understanding and agreed to proceed.    History of Present Illness: Brian Obrien is a 35 y.o. who identifies as a male who was assigned male at birth, and is being seen today for sore throat.  HPI: Patient calls in c/o sore throat and headache for 3 days . Has had congestion for about 1 week. Throat is so sore hard to eat or drink anything. He has not done a covid test.   Review of Systems  Constitutional:  Positive for chills. Negative for fever (?).  HENT:  Positive for congestion and sore throat. Negative for ear pain.   Respiratory:  Positive for cough.   Musculoskeletal:  Negative for myalgias.  Neurological:  Positive for headaches.   Problems:  Patient Active Problem List   Diagnosis Date Noted   Tear of PCL (posterior cruciate ligament) of knee, left, subsequent encounter 07/08/2021   Rupture of anterior cruciate ligament of right knee 07/08/2021   Tear of LCL (lateral collateral ligament) of knee, right, initial encounter 07/08/2021   Anxiety 07/08/2021   Low testosterone 04/02/2021   Polyphagia 03/24/2021   Seasonal allergies 11/27/2020   Other hyperlipidemia 11/04/2020   Depression 09/01/2020   Vitamin D deficiency 08/14/2020   Insulin resistance 07/07/2020   Pain of joint of left ankle and foot 05/09/2020   Joint pain  04/03/2020   Metabolic syndrome 12/25/2019   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   Chronic neck pain 11/12/2019   Chronic pain of left knee 11/12/2019   Class 2 severe obesity with serious comorbidity and body mass index (BMI) of 36.0 to 36.9 in adult [facility]) 11/12/2019   Left sided sciatica 03/15/2018   Lytic bone lesions on xray 02/07/2018   Dependence on nicotine from chewing tobacco 12/15/2017   Obesity (BMI 30-39.9) 12/15/2017   OSA (obstructive sleep apnea) 12/15/2017    Allergies: No Known  Allergies Medications:  Current Outpatient Medications:    acetaminophen (TYLENOL) 500 MG tablet, Take 1 tablet (500 mg total) by mouth every 6 (six) hours as needed for mild pain, moderate pain, fever or headache., Disp: 30 tablet, Rfl: 0   albuterol (VENTOLIN HFA) 108 (90 Base) MCG/ACT inhaler, Inhale 2 puffs into the lungs every 6 (six) hours as needed., Disp: 18 g, Rfl: 0   azelastine (ASTELIN) 0.1 % nasal spray, PLACE 2 SPRAYS INTO BOTH NOSTRILS 2 (TWO) TIMES DAILY AS DIRECTED, Disp: 30 mL, Rfl: 2   Biotin 56433 MCG TABS, Take by mouth., Disp: , Rfl:    Cholecalciferol (VITAMIN D) 125 MCG (5000 UT) CAPS, Take 1 capsule by mouth daily., Disp: 30 capsule, Rfl: 0   cyclobenzaprine (FLEXERIL) 10 MG tablet, Take 1 tablet (10 mg total) by mouth at bedtime., Disp: 15 tablet, Rfl: 0   doxycycline (VIBRA-TABS) 100 MG tablet, Take 1 tablet by mouth twice a day, Disp: 20 tablet, Rfl: 0   HYDROcodone-acetaminophen (NORCO/VICODIN) 5-325 MG tablet, Take 1 tablet by mouth every four to six hours as needed for pain, Disp: 30 tablet, Rfl: 0   loratadine (CLARITIN) 10 MG tablet, Take 10 mg by mouth daily., Disp: , Rfl:    meclizine (ANTIVERT) 25 MG tablet, Take 1 tablet (25 mg total) by mouth 3 (three) times daily as needed for dizziness., Disp: 30 tablet, Rfl: 0   moxifloxacin (AVELOX) 400 MG tablet, Take 1 tablet by mouth once a day, Disp: 10 tablet, Rfl: 0   Multiple Vitamin (MULTIVITAMIN) tablet, Take 1 tablet by mouth every other day., Disp: , Rfl:    Omega-3 Fatty Acids (FISH OIL) 1000 MG CAPS, Take by mouth., Disp: , Rfl:    ondansetron (ZOFRAN) 4 MG tablet, Take 1 tablet by mouth every eight hours as needed for nausea and vomiting, Disp: 10 tablet, Rfl: 0   Semaglutide-Weight Management (WEGOVY) 1 MG/0.5ML SOAJ, Inject 1 mg into the skin once a week., Disp: 2 mL, Rfl: 0   sodium chloride (OCEAN) 0.65 % SOLN nasal spray, Place 1 spray into both nostrils as needed for congestion., Disp: 30 mL, Rfl: 0    sulfamethoxazole-trimethoprim (BACTRIM DS) 800-160 MG tablet, Take 1 tablet by mouth twice a day, Disp: 20 tablet, Rfl: 0   sulfamethoxazole-trimethoprim (BACTRIM DS) 800-160 MG tablet, Take 1 tablet by mouth twice a day, Disp: 6 tablet, Rfl: 0   sulfamethoxazole-trimethoprim (BACTRIM) 400-80 MG tablet, Take 1 tablet by mouth twice a day, Disp: 20 tablet, Rfl: 0  Observations/Objective: Patient is well-developed, well-nourished in no acute distress.  Resting comfortably  at home.  Head is normocephalic, atraumatic.  No labored breathing.  Speech is clear and coherent with logical content.  Patient is alert and oriented at baseline.  Voice froggy  Assessment and Plan:  Ada Woodbury in today with chief complaint of No chief complaint on file.   1. Pharyngitis, unspecified etiology  2. Acute nonintractable  headache, unspecified headache type Force fluids Motrin or tylenol OTC OTC decongestant Throat lozenges if help New toothbrush in 3 days Need to do covid test- if positive let us know      Follow Up Instructions: I discussed the assessment and treatment plan with the patient. The patient was provided an opportunity to ask questions and all were answered. The patient agreed with the plan and demonstrated an understanding of the instructions.  A copy of instructions were sent to the patient via MyChart.  The patient was advised to call back or seek an in-person evaluation if the symptoms worsen or if the condition fails to improve as anticipated.  Patient Instructions  Pharyngitis Pharyngitis is a sore throat (pharynx). This is when there is redness, pain, and swelling in your throat. Most of the time, this condition gets better on its own. In some cases, you may need medicine. What are the causes? An infection from a virus. An infection from bacteria. Allergies. What increases the risk? Being 57-19 years old. Being in crowded environments. These  include: Daycares. Schools. Dormitories. Living in a place with cold temperatures outside. Having a weakened disease-fighting (immune) system. What are the signs or symptoms? Symptoms may vary depending on the cause. Common symptoms include: Sore throat. Tiredness (fatigue). Low-grade fever. Stuffy nose. Cough. Headache. Other symptoms may include: Glands in the neck (lymph nodes) that are swollen. Skin rashes. Film on the throat or tonsils. This can be caused by an infection from bacteria. Vomiting. Red, itchy eyes. Loss of appetite. Joint pain and muscle aches. Tonsils that are temporarily bigger than usual (enlarged). How is this treated? Many times, treatment is not needed. This condition usually gets better in 3-4 days without treatment. If the infection is caused by a bacteria, you may be need to take antibiotics. Follow these instructions at home: Medicines Take over-the-counter and prescription medicines only as told by your doctor. If you were prescribed an antibiotic medicine, take it as told by your doctor. Do not stop taking the antibiotic even if you start to feel better. Use throat lozenges or sprays to soothe your throat as told by your doctor. Children can get pharyngitis. Do not give your child aspirin. Managing pain To help with pain, try: Sipping warm liquids, such as: Broth. Herbal tea. Warm water. Eating or drinking cold or frozen liquids, such as frozen ice pops. Rinsing your mouth (gargle) with a salt water mixture 3-4 times a day or as needed. To make salt water, dissolve -1 tsp (3-6 g) of salt in 1 cup (237 mL) of warm water. Do not swallow this mixture. Sucking on hard candy or throat lozenges. Putting a cool-mist humidifier in your bedroom at night to moisten the air. Sitting in the bathroom with the door closed for 5-10 minutes while you run hot water in the shower.  General instructions  Do not smoke or use any products that contain  nicotine or tobacco. If you need help quitting, ask your doctor. Rest as told by your doctor. Drink enough fluid to keep your pee (urine) pale yellow. How is this prevented? Wash your hands often for at least 20 seconds with soap and water. If soap and water are not available, use hand sanitizer. Do not touch your eyes, nose, or mouth with unwashed hands. Wash hands after touching these areas. Do not share cups or eating utensils. Avoid close contact with people who are sick. Contact a doctor if: You have large, tender lumps in your neck. You  have a rash. You cough up green, yellow-brown, or bloody spit. Get help right away if: You have a stiff neck. You drool or cannot swallow liquids. You cannot drink or take medicines without vomiting. You have very bad pain that does not go away with medicine. You have problems breathing, and it is not from a stuffy nose. You have new pain and swelling in your knees, ankles, wrists, or elbows. These symptoms may be an emergency. Get help right away. Call your local emergency services (911 in the [place] Do not wait to see if the symptoms will go away. Do not drive yourself to the hospital. Summary Pharyngitis is a sore throat (pharynx). This is when there is redness, pain, and swelling in your throat. Most of the time, pharyngitis gets better on its own. Sometimes, you may need medicine. If you were prescribed an antibiotic medicine, take it as told by your doctor. Do not stop taking the antibiotic even if you start to feel better. This information is not intended to replace advice given to you by your health care provider. Make sure you discuss any questions you have with your health care provider. Document Revised: 02/04/2021 Document Reviewed: 02/04/2021 Elsevier Patient Education  2022 [employer] Inc.  Time:  I spent 11 minutes with the patient via telehealth technology discussing the above problems/concerns.    [name], FNP

## 2021-09-10 ENCOUNTER — Telehealth: Payer: Managed Care, Other (non HMO) | Admitting: Physician Assistant

## 2021-09-10 DIAGNOSIS — J020 Streptococcal pharyngitis: Secondary | ICD-10-CM

## 2021-09-10 MED ORDER — BENZONATATE 100 MG PO CAPS: 100.0000 mg | ORAL_CAPSULE | Freq: Three times a day (TID) | ORAL | 0 refills | Status: DC | PRN

## 2021-09-10 MED ORDER — LIDOCAINE VISCOUS HCL 2 % MT SOLN
OROMUCOSAL | 0 refills | Status: DC
Start: 2021-09-10 — End: 2022-08-19

## 2021-09-10 MED ORDER — AMOXICILLIN 500 MG PO CAPS: 500.0000 mg | ORAL_CAPSULE | Freq: Two times a day (BID) | ORAL | 0 refills | Status: AC

## 2021-09-10 NOTE — Progress Notes (Signed)
Virtual Visit Consent   Brian Obrien, you are scheduled for a virtual visit with a [place] provider today.     Just as with appointments in the office, your consent must be obtained to participate.  Your consent will be active for this visit and any virtual visit you may have with one of our providers in the next 365 days.     If you have a MyChart account, a copy of this consent can be sent to you electronically.  All virtual visits are billed to your insurance company just like a traditional visit in the office.    As this is a virtual visit, video technology does not allow for your provider to perform a traditional examination.  This may limit your provider's ability to fully assess your condition.  If your provider identifies any concerns that need to be evaluated in person or the need to arrange testing (such as labs, EKG, etc.), we will make arrangements to do so.     Although advances in technology are sophisticated, we cannot ensure that it will always work on either your end or our end.  If the connection with a video visit is poor, the visit may have to be switched to a telephone visit.  With either a video or telephone visit, we are not always able to ensure that we have a secure connection.     I need to obtain your verbal consent now.   Are you willing to proceed with your visit today?    Brian Obrien has provided verbal consent on 09/10/2021 for a virtual visit (video or telephone).   Brian Loveless, PA-C   Date: 09/10/2021 8:56 AM   Virtual Visit via Video Note   I, Brian Obrien, connected with  Brian Obrien  ([id number], Sep 12, 1986) on 09/10/21 at  8:45 AM EDT by a video-enabled telemedicine application and verified that I am speaking with the correct person using two identifiers.  Location: Patient: Virtual Visit Location Patient: Home Provider: Virtual Visit Location Provider: Home Office   I discussed the limitations of evaluation and  management by telemedicine and the availability of in person appointments. The patient expressed understanding and agreed to proceed.    History of Present Illness: Brian Obrien is a 35 y.o. who identifies as a male who was assigned male at birth, and is being seen today for sore throat.  HPI: Sore Throat  This is a new problem. The current episode started in the past 7 days (last 4 days). The problem has been gradually worsening. The maximum temperature recorded prior to his arrival was 100.4 - 100.9 F. The fever has been present for Less than 1 day. The patient is experiencing no pain. Associated symptoms include congestion, coughing, ear pain (left ear > right), headaches, a hoarse voice, neck pain (with palpation in the front), swollen glands and trouble swallowing. Pertinent negatives include no drooling, ear discharge, plugged ear sensation or shortness of breath. Associated symptoms comments: Body aches. He has had exposure to strep. He has had no exposure to mono. Exposure to: nephews, possibly. He has tried acetaminophen and cool liquids for the symptoms. The treatment provided mild relief.   Covid testing at home  Problems:  Patient Active Problem List   Diagnosis Date Noted   Tear of PCL (posterior cruciate ligament) of knee, left, subsequent encounter 07/08/2021   Rupture of anterior cruciate ligament of right knee 07/08/2021   Tear of LCL (lateral collateral ligament) of knee, right, initial  encounter 07/08/2021   Anxiety 07/08/2021   Low testosterone 04/02/2021   Polyphagia 03/24/2021   Seasonal allergies 11/27/2020   Other hyperlipidemia 11/04/2020   Depression 09/01/2020   Vitamin D deficiency 08/14/2020   Insulin resistance 07/07/2020   Pain of joint of left ankle and foot 05/09/2020   Joint pain 04/03/2020   Metabolic syndrome 12/25/2019   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) 11/12/2019   Left sided sciatica 03/15/2018   Lytic bone lesions on xray 02/07/2018   Dependence on nicotine from chewing tobacco 12/15/2017   Obesity (BMI 30-39.9) 12/15/2017   OSA (obstructive sleep apnea) 12/15/2017    Allergies: No Known Allergies Medications:  Current Outpatient Medications:    amoxicillin (AMOXIL) 500 MG capsule, Take 1 capsule (500 mg total) by mouth 2 (two) times daily for 10 days., Disp: 20 capsule, Rfl: 0   benzonatate (TESSALON) 100 MG capsule, Take 1 capsule (100 mg total) by mouth 3 (three) times daily as needed., Disp: 30 capsule, Rfl: 0   lidocaine (XYLOCAINE) 2 % solution, 22mL swish and gargle in mouth for 15-20 seconds then swallow, may use every 4 hours as needed, Disp: 200 mL, Rfl: 0   acetaminophen (TYLENOL) 500 MG tablet, Take 1 tablet (500 mg total) by mouth every 6 (six) hours as needed for mild pain, moderate pain, fever or headache., Disp: 30 tablet, Rfl: 0   albuterol (VENTOLIN HFA) 108 (90 Base) MCG/ACT inhaler, Inhale 2 puffs into the lungs every 6 (six) hours as needed., Disp: 18 g, Rfl: 0   azelastine (ASTELIN) 0.1 % nasal spray, PLACE 2 SPRAYS INTO BOTH NOSTRILS 2 (TWO) TIMES DAILY AS DIRECTED, Disp: 30 mL, Rfl: 2   Biotin 08676 MCG TABS, Take by mouth., Disp: , Rfl:    Cholecalciferol (VITAMIN D) 125 MCG (5000 UT) CAPS, Take 1 capsule by mouth daily., Disp: 30 capsule, Rfl: 0   cyclobenzaprine (FLEXERIL) 10 MG tablet, Take 1 tablet (10 mg total) by mouth at bedtime., Disp: 15 tablet, Rfl: 0   HYDROcodone-acetaminophen (NORCO/VICODIN) 5-325 MG tablet, Take 1 tablet by mouth every four to six hours as needed for pain, Disp: 30 tablet, Rfl: 0   loratadine (CLARITIN) 10 MG tablet, Take 10 mg by mouth daily., Disp: , Rfl:    meclizine (ANTIVERT) 25 MG tablet, Take 1 tablet (25 mg total) by mouth 3 (three) times daily as needed for  dizziness., Disp: 30 tablet, Rfl: 0   Multiple Vitamin (MULTIVITAMIN) tablet, Take 1 tablet by mouth every other day., Disp: , Rfl:    Omega-3 Fatty Acids (FISH OIL) 1000 MG CAPS, Take by mouth., Disp: , Rfl:    ondansetron (ZOFRAN) 4 MG tablet, Take 1 tablet by mouth every eight hours as needed for nausea and vomiting, Disp: 10 tablet, Rfl: 0   Semaglutide-Weight Management (WEGOVY) 1 MG/0.5ML SOAJ, Inject 1 mg into the skin once a week., Disp: 2 mL, Rfl: 0   sodium chloride (OCEAN) 0.65 % SOLN nasal spray, Place 1 spray into both nostrils as needed for congestion., Disp: 30 mL, Rfl: 0  Observations/Objective: Patient is well-developed, well-nourished in no acute distress.  Resting comfortably  at home.  Head is normocephalic, atraumatic.  No labored breathing.  Speech is clear and coherent with logical content.  Patient is  alert and oriented at baseline.    Assessment and Plan: 1. Strep pharyngitis - amoxicillin (AMOXIL) 500 MG capsule; Take 1 capsule (500 mg total) by mouth 2 (two) times daily for 10 days.  Dispense: 20 capsule; Refill: 0 - lidocaine (XYLOCAINE) 2 % solution; 57mL swish and gargle in mouth for 15-20 seconds then swallow, may use every 4 hours as needed  Dispense: 200 mL; Refill: 0 - benzonatate (TESSALON) 100 MG capsule; Take 1 capsule (100 mg total) by mouth 3 (three) times daily as needed.  Dispense: 30 capsule; Refill: 0  - Suspect strep throat - Will treat with Amoxil as above - Viscous lidocaine for throat pain - Tessalon for cough - Push fluids - Tylenol and ibuprofen PRN for pain and fevers - Seek in person evaluation if symptoms worsen or fail to improve  Follow Up Instructions: I discussed the assessment and treatment plan with the patient. The patient was provided an opportunity to ask questions and all were answered. The patient agreed with the plan and demonstrated an understanding of the instructions.  A copy of instructions were sent to the patient via  MyChart unless otherwise noted below.    The patient was advised to call back or seek an in-person evaluation if the symptoms worsen or if the condition fails to improve as anticipated.  Time:  I spent 12 minutes with the patient via telehealth technology discussing the above problems/concerns.    Brian Loveless, PA-C

## 2021-09-10 NOTE — Patient Instructions (Signed)
Brian Obrien, thank you for joining Margaretann Loveless, PA-C for today's virtual visit.  While this provider is not your primary care provider (PCP), if your PCP is located in our provider database this encounter information will be shared with them immediately following your visit.  Consent: (Patient) Brian Obrien provided verbal consent for this virtual visit at the beginning of the encounter.  Current Medications:  Current Outpatient Medications:    amoxicillin (AMOXIL) 500 MG capsule, Take 1 capsule (500 mg total) by mouth 2 (two) times daily for 10 days., Disp: 20 capsule, Rfl: 0   benzonatate (TESSALON) 100 MG capsule, Take 1 capsule (100 mg total) by mouth 3 (three) times daily as needed., Disp: 30 capsule, Rfl: 0   lidocaine (XYLOCAINE) 2 % solution, 6mL swish and gargle in mouth for 15-20 seconds then swallow, may use every 4 hours as needed, Disp: 200 mL, Rfl: 0   acetaminophen (TYLENOL) 500 MG tablet, Take 1 tablet (500 mg total) by mouth every 6 (six) hours as needed for mild pain, moderate pain, fever or headache., Disp: 30 tablet, Rfl: 0   albuterol (VENTOLIN HFA) 108 (90 Base) MCG/ACT inhaler, Inhale 2 puffs into the lungs every 6 (six) hours as needed., Disp: 18 g, Rfl: 0   azelastine (ASTELIN) 0.1 % nasal spray, PLACE 2 SPRAYS INTO BOTH NOSTRILS 2 (TWO) TIMES DAILY AS DIRECTED, Disp: 30 mL, Rfl: 2   Biotin 46962 MCG TABS, Take by mouth., Disp: , Rfl:    Cholecalciferol (VITAMIN D) 125 MCG (5000 UT) CAPS, Take 1 capsule by mouth daily., Disp: 30 capsule, Rfl: 0   cyclobenzaprine (FLEXERIL) 10 MG tablet, Take 1 tablet (10 mg total) by mouth at bedtime., Disp: 15 tablet, Rfl: 0   HYDROcodone-acetaminophen (NORCO/VICODIN) 5-325 MG tablet, Take 1 tablet by mouth every four to six hours as needed for pain, Disp: 30 tablet, Rfl: 0   loratadine (CLARITIN) 10 MG tablet, Take 10 mg by mouth daily., Disp: , Rfl:    meclizine (ANTIVERT) 25 MG tablet, Take 1 tablet (25 mg total) by  mouth 3 (three) times daily as needed for dizziness., Disp: 30 tablet, Rfl: 0   Multiple Vitamin (MULTIVITAMIN) tablet, Take 1 tablet by mouth every other day., Disp: , Rfl:    Omega-3 Fatty Acids (FISH OIL) 1000 MG CAPS, Take by mouth., Disp: , Rfl:    ondansetron (ZOFRAN) 4 MG tablet, Take 1 tablet by mouth every eight hours as needed for nausea and vomiting, Disp: 10 tablet, Rfl: 0   Semaglutide-Weight Management (WEGOVY) 1 MG/0.5ML SOAJ, Inject 1 mg into the skin once a week., Disp: 2 mL, Rfl: 0   sodium chloride (OCEAN) 0.65 % SOLN nasal spray, Place 1 spray into both nostrils as needed for congestion., Disp: 30 mL, Rfl: 0   Medications ordered in this encounter:  Meds ordered this encounter  Medications   amoxicillin (AMOXIL) 500 MG capsule    Sig: Take 1 capsule (500 mg total) by mouth 2 (two) times daily for 10 days.    Dispense:  20 capsule    Refill:  0    Order Specific Question:   Supervising Provider    Answer:   MILLER, BRIAN [[id number]]   lidocaine (XYLOCAINE) 2 % solution    Sig: 83mL swish and gargle in mouth for 15-20 seconds then swallow, may use every 4 hours as needed    Dispense:  200 mL    Refill:  0    Order Specific Question:   Supervising  Provider    Answer:   Eber Hong [[id number]]   benzonatate (TESSALON) 100 MG capsule    Sig: Take 1 capsule (100 mg total) by mouth 3 (three) times daily as needed.    Dispense:  30 capsule    Refill:  0    Order Specific Question:   Supervising Provider    Answer:   Hyacinth Meeker, BRIAN [[id number]]     *If you need refills on other medications prior to your next appointment, please contact your pharmacy*  Follow-Up: Call back or seek an in-person evaluation if the symptoms worsen or if the condition fails to improve as anticipated.  Other Instructions Strep Throat, Adult Strep throat is an infection of the throat. It is caused by germs (bacteria). Strep throat is common during the cold months of the year. It mostly affects children who  are 65-79 years old. However, people of all ages can get it at any time of the year. This infection spreads from person to person through coughing, sneezing, or having close contact. What are the causes? This condition is caused by the Streptococcus pyogenes germ. What increases the risk? You care for young children. Children are more likely to get strep throat and may spread it to others. You go to crowded places. Germs can spread easily in such places. You kiss or touch someone who has strep throat. What are the signs or symptoms? Fever or chills. Redness, swelling, or pain in the tonsils or throat. Pain or trouble when swallowing. White or yellow spots on the tonsils or throat. Tender glands in the neck and under the jaw. Bad breath. Red rash all over the body. This is rare. How is this treated? Medicines that kill germs (antibiotics). Medicines that treat pain or fever. These include: Ibuprofen or acetaminophen. Aspirin, only for people who are over the age of 68. Cough drops. Throat sprays. Follow these instructions at home: Medicines  Take over-the-counter and prescription medicines only as told by your doctor. Take your antibiotic medicine as told by your doctor. Do not stop taking the antibiotic even if you start to feel better. Eating and drinking  If you have trouble swallowing, eat soft foods until your throat feels better. Drink enough fluid to keep your pee (urine) pale yellow. To help with pain, you may have: Warm fluids, such as soup and tea. Cold fluids, such as frozen desserts or popsicles. General instructions Rinse your mouth (gargle) with a salt-water mixture 3-4 times a day or as needed. To make a salt-water mixture, dissolve -1 tsp (3-6 g) of salt in 1 cup (237 mL) of warm water. Rest as much as you can. Stay home from work or school until you have been taking antibiotics for 24 hours. Do not smoke or use any products that contain nicotine or tobacco. If  you need help quitting, ask your doctor. Keep all follow-up visits. How is this prevented?  Do not share food, drinking cups, or personal items. They can cause the germs to spread. Wash your hands well with soap and water. Make sure that all people in your house wash their hands well. Have family members tested if they have a fever or a sore throat. They may need an antibiotic if they have strep throat. Contact a doctor if: You have swelling in your neck that keeps getting bigger. You get a rash, cough, or earache. You cough up a thick fluid that is green, yellow-brown, or bloody. You have pain that does not get better with  medicine. Your symptoms get worse instead of getting better. You have a fever. Get help right away if: You vomit. You have a very bad headache. Your neck hurts or feels stiff. You have chest pain or are short of breath. You have drooling, very bad throat pain, or changes in your voice. Your neck is swollen, or the skin gets red and tender. Your mouth is dry, or you are peeing less than normal. You keep feeling more tired or have trouble waking up. Your joints are red or painful. These symptoms may be an emergency. Do not wait to see if the symptoms will go away. Get help right away. Call your local emergency services (911 in the [place] Summary Strep throat is an infection of the throat. It is caused by germs (bacteria). This infection can spread from person to person through coughing, sneezing, or having close contact. Take your medicines, including antibiotics, as told by your doctor. Do not stop taking the antibiotic even if you start to feel better. To prevent the spread of germs, wash your hands well with soap and water. Have others do the same. Do not share food, drinking cups, or personal items. Get help right away if you have a bad headache, chest pain, shortness of breath, a stiff or painful neck, or you vomit. This information is not intended to replace  advice given to you by your health care provider. Make sure you discuss any questions you have with your health care provider. Document Revised: 03/03/2021 Document Reviewed: 03/03/2021 Elsevier Patient Education  2022 [employer].    If you have been instructed to have an in-person evaluation today at a local Urgent Care facility, please use the link below. It will take you to a list of all of our available [facility], including address, phone number and hours of operation. Please do not delay care.  [facility]  If you or a family member do not have a primary care provider, use the link below to schedule a visit and establish care. When you choose a [facility] primary care physician or advanced practice provider, you gain a long-term partner in health. Find a Primary Care Provider  Learn more about [facility]'s in-office and virtual care options: [facility] - Get Care Now

## 2021-09-30 ENCOUNTER — Ambulatory Visit: Payer: Managed Care, Other (non HMO) | Admitting: Cardiology

## 2021-10-01 ENCOUNTER — Encounter (INDEPENDENT_AMBULATORY_CARE_PROVIDER_SITE_OTHER): Payer: Self-pay

## 2021-10-19 ENCOUNTER — Other Ambulatory Visit (HOSPITAL_BASED_OUTPATIENT_CLINIC_OR_DEPARTMENT_OTHER): Payer: Self-pay

## 2021-10-19 MED ORDER — OXYCODONE HCL 5 MG PO TABS
ORAL_TABLET | ORAL | 0 refills | Status: DC
  Filled 2021-10-19: qty 22, 4d supply, fill #0

## 2021-10-19 MED ORDER — METHOCARBAMOL 500 MG PO TABS
ORAL_TABLET | ORAL | 0 refills | Status: DC
  Filled 2021-10-19: qty 45, 15d supply, fill #0

## 2021-10-19 MED ORDER — ONDANSETRON 4 MG PO TBDP
ORAL_TABLET | ORAL | 0 refills | Status: DC
  Filled 2021-10-19: qty 20, 7d supply, fill #0

## 2021-10-20 ENCOUNTER — Other Ambulatory Visit (HOSPITAL_BASED_OUTPATIENT_CLINIC_OR_DEPARTMENT_OTHER): Payer: Self-pay

## 2022-01-13 DIAGNOSIS — S43109A Unspecified dislocation of unspecified acromioclavicular joint, initial encounter: Secondary | ICD-10-CM | POA: Insufficient documentation

## 2022-02-15 IMAGING — US US THYROID
1 series · 14 of 23 positions shown · non-contrast
Comparison: None.

CLINICAL DATA: Other. Thyromegaly with pain over the thyroid gland.

EXAM:
THYROID ULTRASOUND
TECHNIQUE: Ultrasound examination of the thyroid gland and adjacent soft
tissues was performed.

[Series 1: us thyroid · 23 acquisitions, 14 frames shown]
[im 1/23]
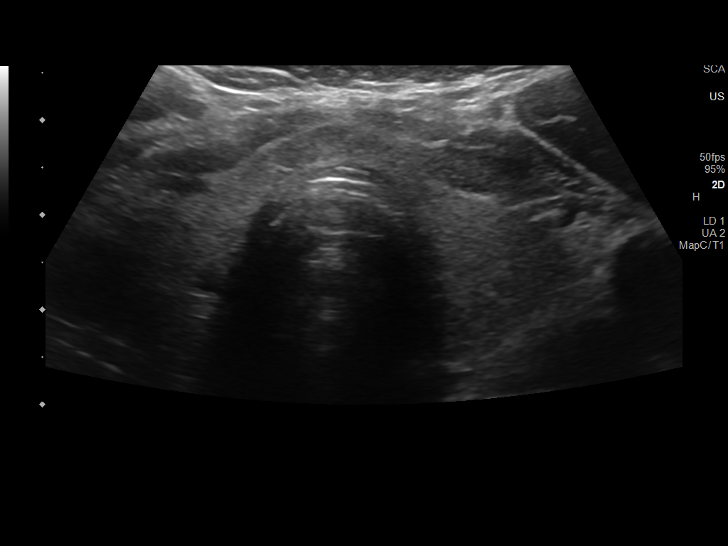
[im 3/23]
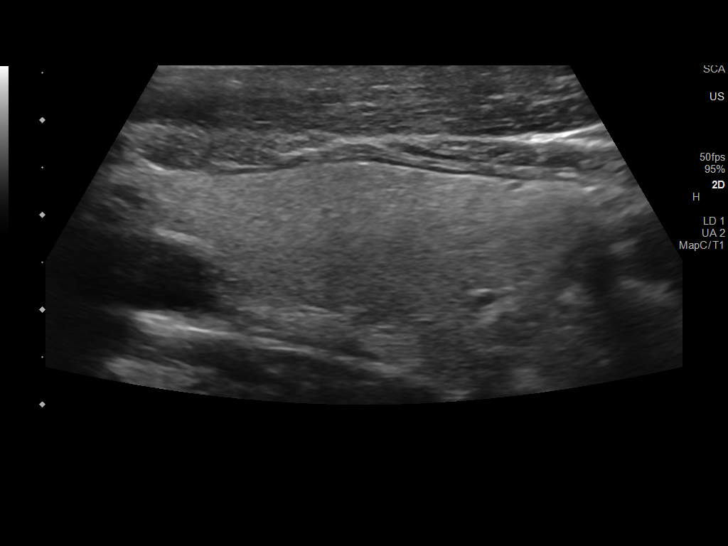
[im 5/23]
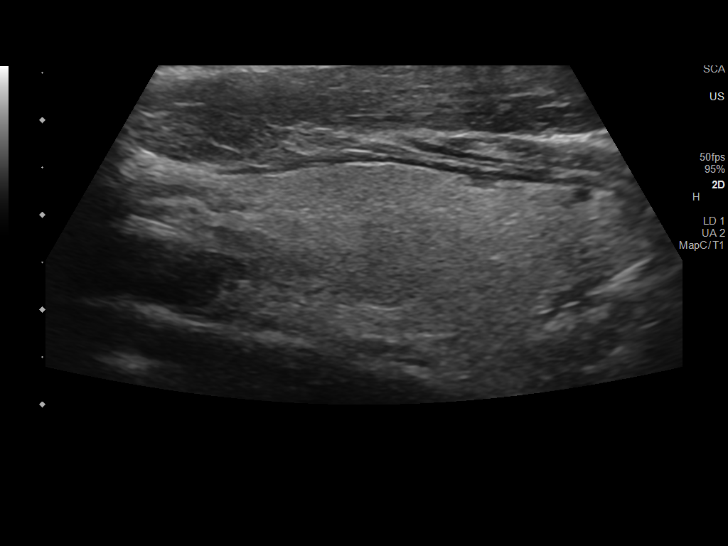
[im 6/23]
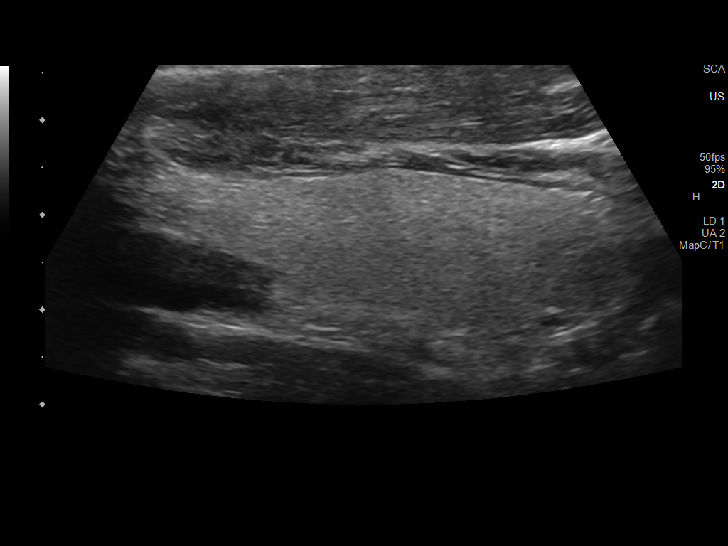
[im 8/23]
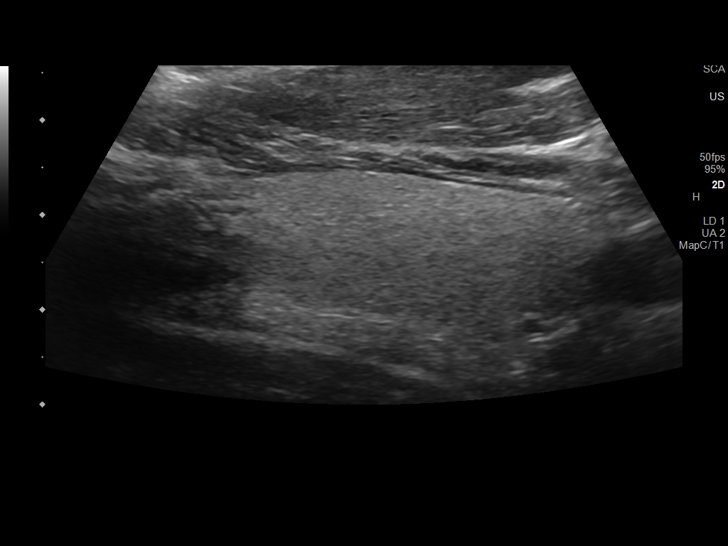
[im 10/23]
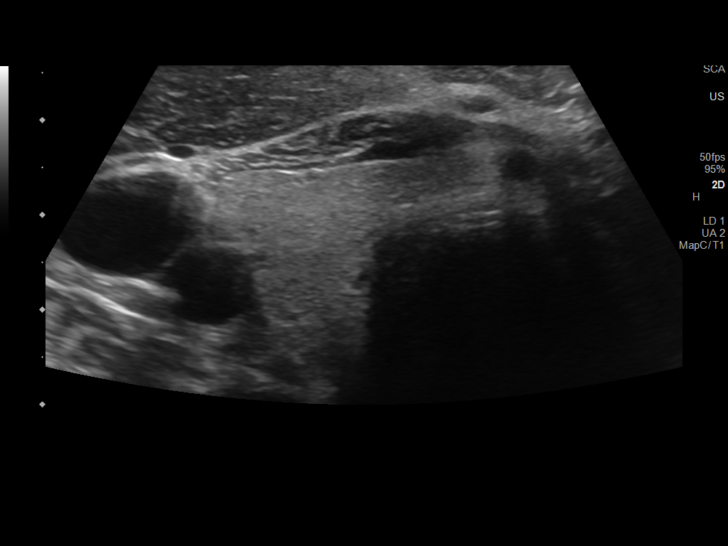
[im 11/23]
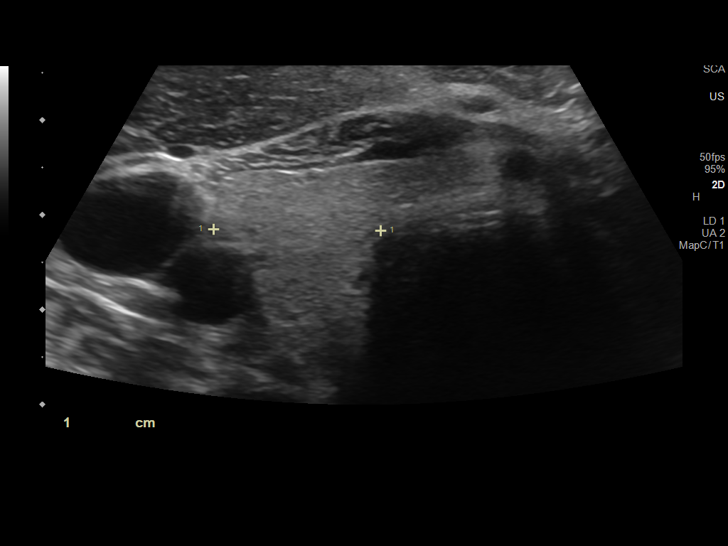
[im 13/23]
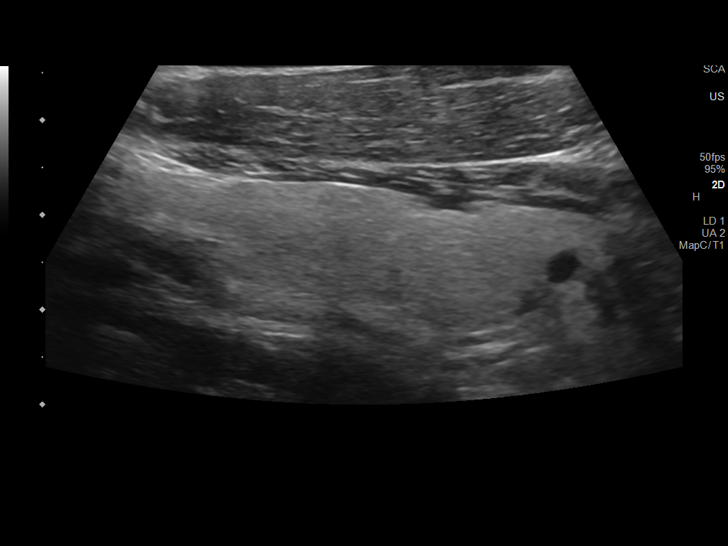
[im 14/23]
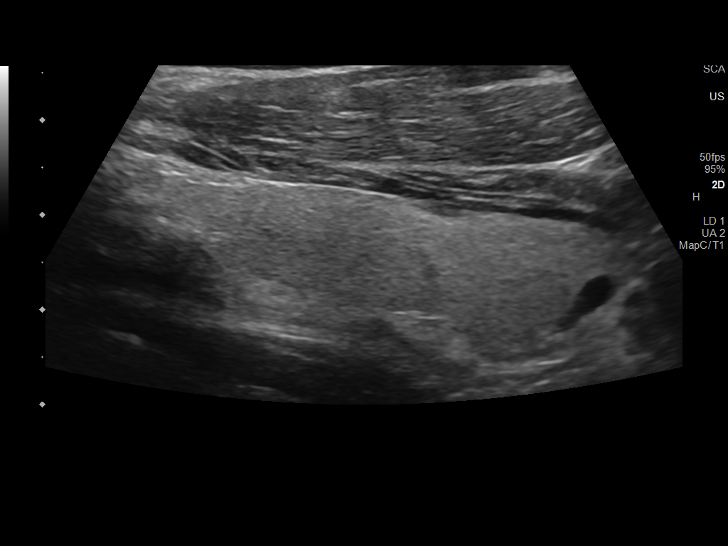
[im 16/23]
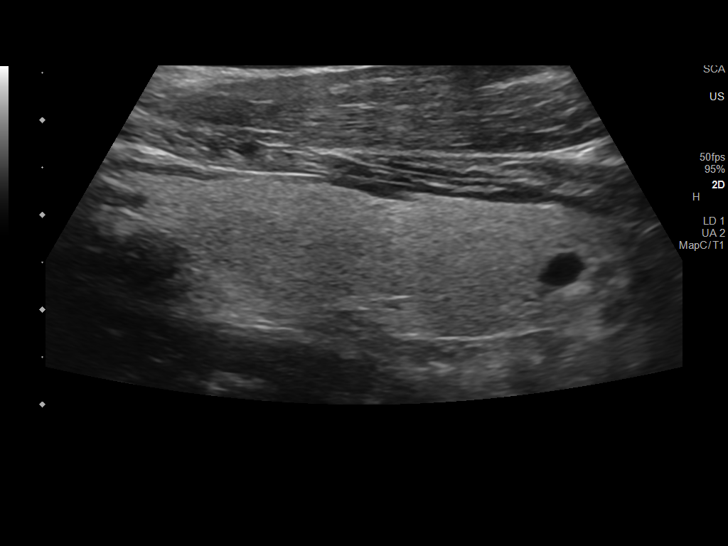
[im 18/23]
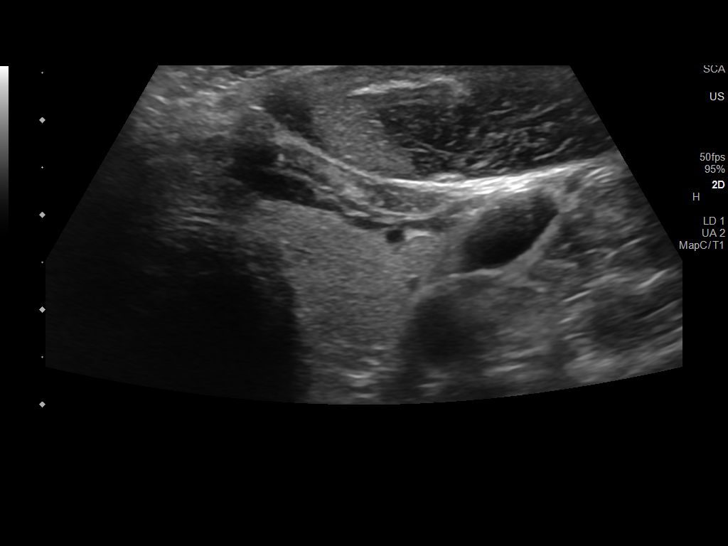
[im 19/23]
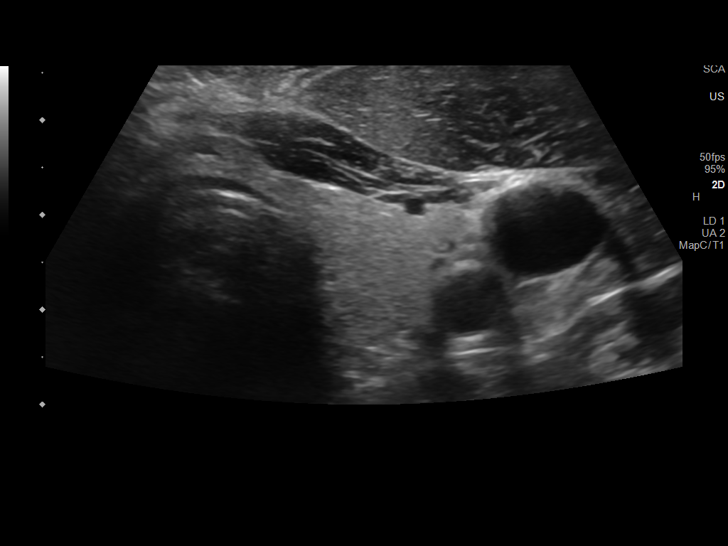
[im 21/23]
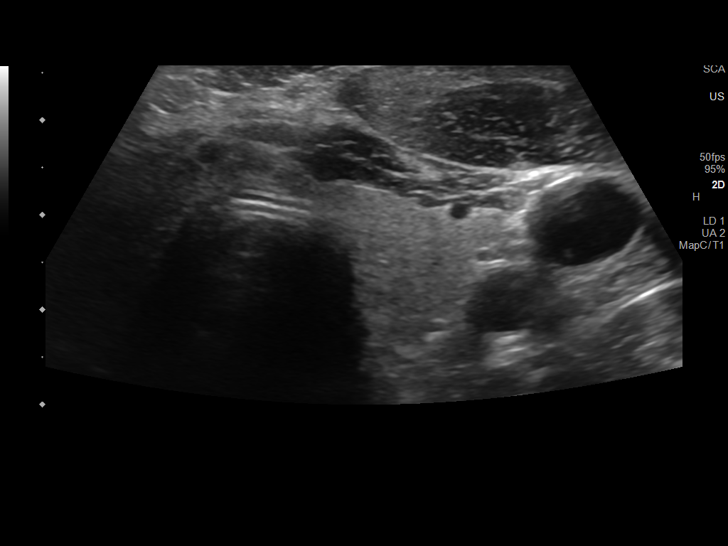
[im 23/23]
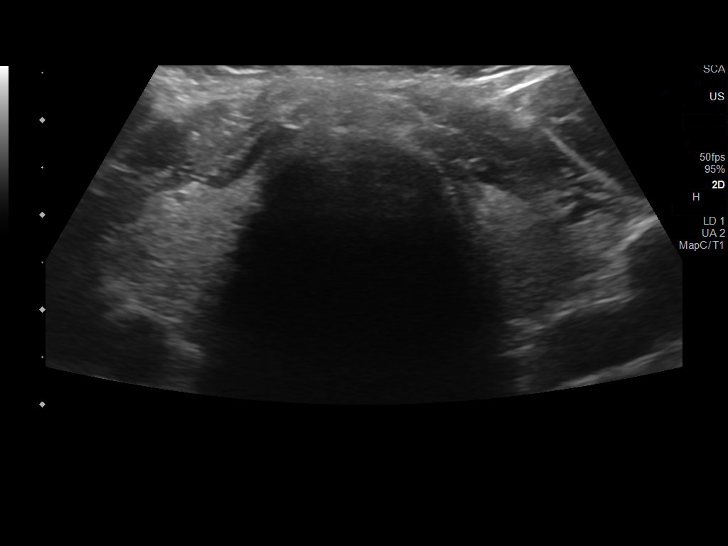

[14 of 23 positions shown; findings below may reference images not displayed]

FINDINGS: Parenchymal Echotexture: Normal

Isthmus: 0.4 cm

Right lobe: 4.9 x 1.7 x 1.8 cm

Left lobe: 5.1 x 1.3 x 1.5 cm

_________________________________________________________

Estimated total number of nodules >/= 1 cm: 0

Number of spongiform nodules >/=  2 cm not described below (TR1): 0

Number of mixed cystic and solid nodules >/= 1.5 cm not described
below (TR2): 0

_________________________________________________________

No discrete nodules are seen within the thyroid gland.
IMPRESSION: Normal sonographic appearance of the thyroid gland. No discrete
nodules are identified.

## 2022-04-05 ENCOUNTER — Ambulatory Visit: Payer: No Typology Code available for payment source | Admitting: Endocrinology

## 2022-06-02 ENCOUNTER — Ambulatory Visit: Payer: Managed Care, Other (non HMO) | Admitting: Medical

## 2022-06-02 VITALS — BP 127/78 | HR 68 | Resp 18 | Ht 70.0 in | Wt 243.0 lb

## 2022-06-02 DIAGNOSIS — K219 Gastro-esophageal reflux disease without esophagitis: Secondary | ICD-10-CM

## 2022-06-02 DIAGNOSIS — M542 Cervicalgia: Secondary | ICD-10-CM

## 2022-06-02 DIAGNOSIS — Z0000 Encounter for general adult medical examination without abnormal findings: Secondary | ICD-10-CM

## 2022-06-02 DIAGNOSIS — M792 Neuralgia and neuritis, unspecified: Secondary | ICD-10-CM

## 2022-06-02 DIAGNOSIS — R5383 Other fatigue: Secondary | ICD-10-CM | POA: Diagnosis not present

## 2022-06-02 DIAGNOSIS — H9313 Tinnitus, bilateral: Secondary | ICD-10-CM

## 2022-06-02 DIAGNOSIS — R42 Dizziness and giddiness: Secondary | ICD-10-CM

## 2022-06-02 DIAGNOSIS — R1310 Dysphagia, unspecified: Secondary | ICD-10-CM

## 2022-06-02 LAB — LIPID PANEL
Cholesterol: 190 mg/dL (ref 0–200)
HDL: 42.3 mg/dL (ref >39.00)
NonHDL: 148.19
Total CHOL/HDL Ratio: 5
Triglycerides: 229 mg/dL — ABNORMAL HIGH (ref 0.0–149.0)
VLDL: 45.8 mg/dL — ABNORMAL HIGH (ref 0.0–40.0)

## 2022-06-02 LAB — CBC WITH DIFFERENTIAL/PLATELET
Basophils Absolute: 0 10*3/uL (ref 0.0–0.1)
Basophils Relative: 0.8 % (ref 0.0–3.0)
Eosinophils Absolute: 0.2 10*3/uL (ref 0.0–0.7)
Eosinophils Relative: 2.8 % (ref 0.0–5.0)
HCT: 42.5 % (ref 39.0–52.0)
Hemoglobin: 14.2 g/dL (ref 13.0–17.0)
Lymphocytes Relative: 31.6 % (ref 12.0–46.0)
Lymphs Abs: 1.7 10*3/uL (ref 0.7–4.0)
MCHC: 33.4 g/dL (ref 30.0–36.0)
MCV: 82.3 fl (ref 78.0–100.0)
Monocytes Absolute: 0.6 10*3/uL (ref 0.1–1.0)
Monocytes Relative: 10.4 % (ref 3.0–12.0)
Neutro Abs: 3 10*3/uL (ref 1.4–7.7)
Neutrophils Relative %: 54.4 % (ref 43.0–77.0)
Platelets: 294 10*3/uL (ref 150.0–400.0)
RBC: 5.16 Mil/uL (ref 4.22–5.81)
RDW: 12.6 % (ref 11.5–15.5)
WBC: 5.5 10*3/uL (ref 4.0–10.5)

## 2022-06-02 LAB — COMPREHENSIVE METABOLIC PANEL
ALT: 27 U/L (ref 0–53)
AST: 21 U/L (ref 0–37)
Albumin: 4.6 g/dL (ref 3.5–5.2)
Alkaline Phosphatase: 64 U/L (ref 39–117)
BUN: 11 mg/dL (ref 6–23)
CO2: 31 mEq/L (ref 19–32)
Calcium: 9.8 mg/dL (ref 8.4–10.5)
Chloride: 101 mEq/L (ref 96–112)
Creatinine, Ser: 0.84 mg/dL (ref 0.40–1.50)
GFR: 114.12 mL/min (ref >60.00)
Glucose, Bld: 82 mg/dL (ref 70–99)
Potassium: 4 mEq/L (ref 3.5–5.1)
Sodium: 138 mEq/L (ref 135–145)
Total Bilirubin: 0.5 mg/dL (ref 0.2–1.2)
Total Protein: 7.3 g/dL (ref 6.0–8.3)

## 2022-06-02 LAB — LDL CHOLESTEROL, DIRECT: Direct LDL: 122 mg/dL

## 2022-06-02 LAB — VITAMIN B12: Vitamin B-12: 325 pg/mL (ref 211–911)

## 2022-06-02 LAB — TSH: TSH: 2.53 u[IU]/mL (ref 0.35–5.50)

## 2022-06-02 LAB — T4, FREE: Free T4: 0.68 ng/dL (ref 0.60–1.60)

## 2022-06-02 MED ORDER — MECLIZINE HCL 25 MG PO TABS: 25.0000 mg | ORAL_TABLET | Freq: Three times a day (TID) | ORAL | 0 refills | Status: DC | PRN

## 2022-06-02 NOTE — Progress Notes (Signed)
Subjective:    Patient ID: Brian Obrien, male    DOB: 1986-03-27, 36 y.o.   MRN: [id number]  HPI Pt in for cpe/wellness.   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 Pt was doing low carb diet in past but now admits not best diet(but avoiding junk food). Non smoker. Former chewed tobacco. Single. No alcohol.    Pt still having neck pain for 2 year or more. Pain rt side of neck with pain radiating down to rt hand. Numbness and tinlging. Pt saw sports medicine and had PT. He explains he did get better after onset but then June of last year pain started to recoccur.  Imaging study in the past showed.  IMPRESSION: C6-7: Shallow chronic appearing disc herniation with slight caudal down turning in the midline behind C7. This indents the thecal sac slightly but does not appear to result in compressive stenosis. AP diameter of the canal in that region 9 mm. No foraminal stenosis. Findings could be associated with neck pain.   At C2-3 and C3-4, there are probably small central disc bulges. No compressive canal or foraminal narrowing. This appearance could also possibly relate to early calcification of the posterior longitudinal   Pt also states pain on swallowing at times. Points to area over thyroid region. Lasts for 2 days intermittently then resolved. He does report history of reflux but symptoms improved. Uses pepcid complete 2-3 times a month.  Last year 3-22 [place] of thyroid was normal.   On review pt phq-9 score is 5. He not sure if depressed. He does not fatigue and not sure if fatigue playing a roll. Score was done at screening not at pt requeest.    Review of Systems  Constitutional:  Negative for chills, fatigue and fever.  HENT:  Negative for congestion, ear discharge and ear pain.   Respiratory:  Negative for cough, chest tightness, shortness of breath and wheezing.   Cardiovascular:  Negative for chest pain and palpitations.  Gastrointestinal:  Negative for abdominal  pain, diarrhea and vomiting.       Dysphagia occasional. Rare gerd. See hpi.  Genitourinary:  Negative for enuresis, flank pain and urgency.  Musculoskeletal:  Positive for neck pain. Negative for back pain and joint swelling.  Skin:  Negative for rash.  Neurological:  Negative for facial asymmetry and numbness.  Hematological:  Negative for adenopathy. Does not bruise/bleed easily.  Psychiatric/Behavioral:  Negative for behavioral problems, dysphoric mood and suicidal ideas. The patient is not nervous/anxious.    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 Of right iliac crest.  Seen on x-ray in 1-19 and CT on 02/05/2018. Unchanged xray in 03/2018. Repeat in 6 months (CT vs Xray)   Metabolic syndrome 12/25/2019   Obesity (BMI 30-39.9) 12/15/2017   OSA (obstructive sleep apnea) 12/15/2017   2015; unable to tolerate CPAP   Palpitations    Severe obesity (BMI 35.0-39.9) with  comorbidity (HCC) 11/12/2019   Sleep apnea    SOB (shortness of breath)    Vitamin B 12 deficiency    Vitamin D deficiency      Social History   Socioeconomic History   Marital status: Single    Spouse name: Not on file   Number of children: Not on file   Years of education: Not on file   Highest education level: Not on file  Occupational History   Occupation: mri tech  Tobacco Use   Smoking status: Former    Packs/day: 0.25    Types: Cigarettes    Quit date: 03/06/2012    Years since quitting: 10.2   Smokeless tobacco: Former    Types: Chew     Quit date: 03/17/2016  Vaping Use   Vaping Use: Never used  Substance and Sexual Activity   Alcohol use: Not Currently   Drug use: Never   Sexual activity: Not on file  Other Topics Concern   Not on file  Social History Narrative   Not on file   Social Determinants of Health   Financial Resource Strain: Not on file  Food Insecurity: Not on file  Transportation Needs: Not on file  Physical Activity: Not on file  Stress: Not on file  Social Connections: Not on file  Intimate Partner Violence: Not on file    Past Surgical History:  Procedure Laterality Date   HAIR TRANSPLANT      Family History  Problem Relation Age of Onset   COPD Father    Heart disease Father    Liver disease Father    Depression Father    Diabetes Mother    High blood pressure Mother    High Cholesterol Mother    Kidney disease Mother    Thyroid disease Mother    Sleep apnea Mother    Obesity Mother    Other Brother        low testosterone    No Known Allergies  Current Outpatient Medications on File Prior to Visit  Medication Sig Dispense Refill   acetaminophen (TYLENOL) 500 MG tablet Take 1 tablet (500 mg total) by mouth every 6 (six) hours as needed for mild pain, moderate pain, fever or headache. 30 tablet 0   albuterol (VENTOLIN HFA) 108 (90 Base) MCG/ACT inhaler Inhale 2 puffs into the lungs every 6 (six) hours as needed. 18 g 0   benzonatate (TESSALON) 100 MG capsule Take 1 capsule (100 mg total) by mouth 3 (three) times daily as needed. 30 capsule 0   Biotin 02774 MCG TABS Take by mouth.     cyclobenzaprine (FLEXERIL) 10 MG tablet Take 1 tablet (10 mg total) by mouth at bedtime. 15 tablet 0   HYDROcodone-acetaminophen (NORCO/VICODIN) 5-325 MG tablet Take 1 tablet by mouth every four to six hours as needed for pain 30 tablet 0   lidocaine (XYLOCAINE) 2 % solution 42mL swish and gargle in mouth for 15-20 seconds then swallow, may use every 4 hours as needed 200 mL 0   loratadine  (CLARITIN) 10 MG tablet Take 10 mg by mouth daily.     meclizine (ANTIVERT) 25 MG tablet Take 1 tablet (25 mg total) by mouth 3 (three) times daily as needed for dizziness. 30 tablet 0   Multiple Vitamin (MULTIVITAMIN) tablet Take 1 tablet by mouth every other day.     ondansetron (ZOFRAN) 4 MG tablet Take 1 tablet by mouth every eight hours as needed for nausea and vomiting 10 tablet 0  azelastine (ASTELIN) 0.1 % nasal spray PLACE 2 SPRAYS INTO BOTH NOSTRILS 2 (TWO) TIMES DAILY AS DIRECTED 30 mL 2   Cholecalciferol (VITAMIN D) 125 MCG (5000 UT) CAPS Take 1 capsule by mouth daily. 30 capsule 0   Omega-3 Fatty Acids (FISH OIL) 1000 MG CAPS Take by mouth.     Semaglutide-Weight Management (WEGOVY) 1 MG/0.5ML SOAJ Inject 1 mg into the skin once a week. 2 mL 0   sodium chloride (OCEAN) 0.65 % SOLN nasal spray Place 1 spray into both nostrils as needed for congestion. 30 mL 0   No current facility-administered medications on file prior to visit.    BP 127/78   Pulse 68   Resp 18   Ht 5\' 10"  (1.778 m)   Wt 243 lb (110.2 kg)   SpO2 98%   BMI 34.87 kg/m        Objective:   Physical Exam   General Mental Status- Alert. General Appearance- Not in acute distress.   Skin General: Color- Normal Color. Moisture- Normal Moisture.  Neck Rt trapezius tenderness to palpation. No thyromegaly.    Chest and Lung Exam Auscultation: Breath Sounds:-Normal.  Cardiovascular Auscultation:Rythm- Regular. Murmurs & Other Heart Sounds:Auscultation of the heart reveals- No Murmurs.  Abdomen Inspection:-Inspeection Normal. Palpation/Percussion:Note:No mass. Palpation and Percussion of the abdomen reveal- Non Tender, Non Distended + BS, no rebound or guarding.   Neurologic Cranial Nerve exam:- CN III-XII intact(No nystagmus), symmetric smile. Strength:- 5/5 equal and symmetric strength both upper and lower extremities.      Assessment & Plan:   Patient Instructions  For you wellness exam  today I have ordered cbc, cmp and lipid panel.  Vaccine appear up to date.  Recommend exercise and healthy diet.  We will let you know lab results as they come in.  Follow up date appointment will be determined after lab review.    For neck pain and radicular pain referred to sports med MD.  For dysphagia and hx of gerd refer to GI MD.     , PA-C   [phone] charge in additon to wellness as did address both neck pain, radicular pain, dysphagia,  gerd, fatigue and possible depression. 2 referrals placed. In addition he mentioned 3 vertigo events in past year. Requires meclizine. He wants referral to ent.

## 2022-06-02 NOTE — Patient Instructions (Addendum)
For you wellness exam today I have ordered cbc, cmp and lipid panel.  Vaccine appear up to date.  Recommend exercise and healthy diet.  We will let you know lab results as they come in.  Follow up date appointment will be determined after lab review.    For neck pain and radicular pain referred to sports med MD.  For dysphagia and hx of gerd refer to GI MD.  Fatigue- will add fatigue labs.   Phq-9 score 5. Discuss possible depression. You note not sure. Will see if lab finding showing abnormality. If you mood worsens let me know.  Preventive Care 34-44 Years Old, Male Preventive care refers to lifestyle choices and visits with your health care provider that can promote health and wellness. Preventive care visits are also called wellness exams. What can I expect for my preventive care visit? Counseling During your preventive care visit, your health care provider may ask about your: Medical history, including: Past medical problems. Family medical history. Current health, including: Emotional well-being. Home life and relationship well-being. Sexual activity. Lifestyle, including: Alcohol, nicotine or tobacco, and drug use. Access to firearms. Diet, exercise, and sleep habits. Safety issues such as seatbelt and bike helmet use. Sunscreen use. Work and work Astronomer. Physical exam Your health care provider may check your: Height and weight. These may be used to calculate your BMI (body mass index). BMI is a measurement that tells if you are at a healthy weight. Waist circumference. This measures the distance around your waistline. This measurement also tells if you are at a healthy weight and may help predict your risk of certain diseases, such as type 2 diabetes and high blood pressure. Heart rate and blood pressure. Body temperature.  Vaccines are usually given at various ages, according to a schedule. Your health care provider will recommend vaccines for you based on  your age, medical history, and lifestyle or other factors, such as travel or where you work. What tests do I need? Screening Your health care provider may recommend screening tests for certain conditions. This may include: Lipid and cholesterol levels. Diabetes screening. This is done by checking your blood sugar (glucose) after you have not eaten for a while (fasting). Hepatitis B test. Hepatitis C test. HIV (human immunodeficiency virus) test. STI (sexually transmitted infection) testing, if you are at risk. Talk with your health care provider about your test results, treatment options, and if necessary, the need for more tests. Follow these instructions at home: Eating and drinking  Eat a healthy diet that includes fresh fruits and vegetables, whole grains, lean protein, and low-fat dairy products. Drink enough fluid to keep your urine pale yellow. Take vitamin and mineral supplements as recommended by your health care provider. Do not drink alcohol if your health care provider tells you not to drink. If you drink alcohol: Limit how much you have to 0-2 drinks a day. Know how much alcohol is in your drink. In the [place] one drink equals one 12 oz bottle of beer (355 mL), one 5 oz glass of wine (148 mL), or one 1 oz glass of hard liquor (44 mL). Lifestyle Brush your teeth every morning and night with fluoride toothpaste. Floss one time each day. Exercise for at least 30 minutes 5 or more days each week. Do not use any products that contain nicotine or tobacco. These products include cigarettes, chewing tobacco, and vaping devices, such as e-cigarettes. If you need help quitting, ask your health care provider. Do not use  drugs. If you are sexually active, practice safe sex. Use a condom or other form of protection to prevent STIs. Find healthy ways to manage stress, such as: Meditation, yoga, or listening to music. Journaling. Talking to a trusted person. Spending time with friends  and family. Minimize exposure to UV radiation to reduce your risk of skin cancer. Safety Always wear your seat belt while driving or riding in a vehicle. Do not drive: If you have been drinking alcohol. Do not ride with someone who has been drinking. If you have been using any mind-altering substances or drugs. While texting. When you are tired or distracted. Wear a helmet and other protective equipment during sports activities. If you have firearms in your house, make sure you follow all gun safety procedures. Seek help if you have been physically or sexually abused. What's next? Go to your health care provider once a year for an annual wellness visit. Ask your health care provider how often you should have your eyes and teeth checked. Stay up to date on all vaccines. This information is not intended to replace advice given to you by your health care provider. Make sure you discuss any questions you have with your health care provider. Document Revised: 05/06/2021 Document Reviewed: 05/06/2021 Elsevier Patient Education  2023 [employer].

## 2022-06-06 LAB — VITAMIN B1: Vitamin B1 (Thiamine): 10 nmol/L (ref 8–30)

## 2022-06-23 ENCOUNTER — Ambulatory Visit: Payer: Managed Care, Other (non HMO) | Admitting: Family Medicine

## 2022-06-30 ENCOUNTER — Encounter: Payer: Self-pay | Admitting: Family Medicine

## 2022-06-30 ENCOUNTER — Ambulatory Visit: Payer: Self-pay

## 2022-06-30 ENCOUNTER — Encounter (INDEPENDENT_AMBULATORY_CARE_PROVIDER_SITE_OTHER): Payer: Self-pay

## 2022-06-30 ENCOUNTER — Ambulatory Visit (HOSPITAL_BASED_OUTPATIENT_CLINIC_OR_DEPARTMENT_OTHER)
Admission: RE | Admit: 2022-06-30 | Discharge: 2022-06-30 | Disposition: A | Discharge status: Home / Self Care | Payer: Managed Care, Other (non HMO) | Source: Ambulatory Visit | Attending: Family Medicine | Admitting: Family Medicine

## 2022-06-30 ENCOUNTER — Ambulatory Visit: Payer: Managed Care, Other (non HMO) | Admitting: Family Medicine

## 2022-06-30 VITALS — BP 121/83 | Ht 70.0 in | Wt 243.0 lb

## 2022-06-30 DIAGNOSIS — M778 Other enthesopathies, not elsewhere classified: Secondary | ICD-10-CM | POA: Insufficient documentation

## 2022-06-30 DIAGNOSIS — G5601 Carpal tunnel syndrome, right upper limb: Secondary | ICD-10-CM

## 2022-06-30 DIAGNOSIS — M5412 Radiculopathy, cervical region: Secondary | ICD-10-CM | POA: Insufficient documentation

## 2022-06-30 MED ORDER — BETAMETHASONE SOD PHOS & ACET 6 (3-3) MG/ML IJ SUSP
6.0000 mg | Freq: Once | INTRAMUSCULAR | Status: AC
  Administered 2022-06-30: 6 mg via INTRAMUSCULAR

## 2022-06-30 MED ORDER — METHYLPREDNISOLONE ACETATE 40 MG/ML IJ SUSP: 40.0000 mg | Freq: Once | INTRAMUSCULAR | Status: DC
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
## 2022-06-30 NOTE — Assessment & Plan Note (Signed)
Acutely occurring with enlargement of the nerve.  Does seem to be a local as opposed to radicular pain. -Counseled on home exercise therapy and supportive care. -Injection. -Could consider physical therapy or nerve study.

## 2022-06-30 NOTE — Assessment & Plan Note (Signed)
Acutely occurring.  Does have some tightness around the shoulder. -Counseled on home exercise therapy and supportive care. -X-ray. -Could consider further imaging or injection.
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
## 2022-06-30 NOTE — Patient Instructions (Signed)
Good to see you Please try the exercises  Please try heat on the neck and shoulder  I will call with the xray results.   Please send me a message in MyChart with any questions or updates.  Please see me back in 4 weeks.   --Dr. Jordan Likes

## 2022-07-02 ENCOUNTER — Telehealth: Payer: Self-pay | Admitting: Family Medicine

## 2022-07-02 DIAGNOSIS — M5412 Radiculopathy, cervical region: Secondary | ICD-10-CM

## 2022-07-02 DIAGNOSIS — M778 Other enthesopathies, not elsewhere classified: Secondary | ICD-10-CM

## 2022-07-02 NOTE — Telephone Encounter (Signed)
Left VM for patient. If he calls back please have him speak with a nurse/CMA and inform that his neck x-ray is showing mild degenerative changes as well as some loss of the normal lordosis which can happen with the spasm.  His shoulder x-ray is normal.   If any questions then please take the best time and phone number to call and I will try to call him back.   Myra Rude, MD [facility] 07/02/2022, 8:19 AM

## 2022-07-07 DIAGNOSIS — R0981 Nasal congestion: Secondary | ICD-10-CM | POA: Insufficient documentation

## 2022-07-07 DIAGNOSIS — K2100 Gastro-esophageal reflux disease with esophagitis, without bleeding: Secondary | ICD-10-CM | POA: Insufficient documentation

## 2022-07-07 DIAGNOSIS — H9313 Tinnitus, bilateral: Secondary | ICD-10-CM | POA: Insufficient documentation

## 2022-07-15 NOTE — Telephone Encounter (Signed)
Pt informed of below.  He is requesting PT referral for neck and shoulder.   PT referral placed. Please advise on MRI.

## 2022-07-15 NOTE — Addendum Note (Signed)
Addended by: Merrilyn Puma on: 07/15/2022 02:26 PM   Modules accepted: Orders

## 2022-07-28 ENCOUNTER — Ambulatory Visit: Payer: Managed Care, Other (non HMO) | Admitting: Family Medicine

## 2022-07-28 ENCOUNTER — Ambulatory Visit: Payer: Managed Care, Other (non HMO) | Attending: Family Medicine | Admitting: Physical Therapy

## 2022-07-28 ENCOUNTER — Encounter: Payer: Self-pay | Admitting: Family Medicine

## 2022-07-28 VITALS — BP 118/70 | Ht 70.0 in | Wt 243.0 lb

## 2022-07-28 DIAGNOSIS — M778 Other enthesopathies, not elsewhere classified: Secondary | ICD-10-CM | POA: Insufficient documentation

## 2022-07-28 DIAGNOSIS — M25512 Pain in left shoulder: Secondary | ICD-10-CM | POA: Insufficient documentation

## 2022-07-28 DIAGNOSIS — M542 Cervicalgia: Secondary | ICD-10-CM | POA: Diagnosis present

## 2022-07-28 DIAGNOSIS — G5601 Carpal tunnel syndrome, right upper limb: Secondary | ICD-10-CM

## 2022-07-28 DIAGNOSIS — M5412 Radiculopathy, cervical region: Secondary | ICD-10-CM

## 2022-07-28 DIAGNOSIS — M25511 Pain in right shoulder: Secondary | ICD-10-CM

## 2022-07-28 DIAGNOSIS — R293 Abnormal posture: Secondary | ICD-10-CM | POA: Diagnosis present

## 2022-07-28 NOTE — Assessment & Plan Note (Signed)
Continues to have tightness with abduction and external rotation. -Counseled on home exercise therapy and supportive care. -Continue physical therapy. -Could consider injections.
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
## 2022-07-28 NOTE — Patient Instructions (Signed)
Good to see you Please continue with physical therapy  Please continue the splint at night  We'll get the MRI of the neck at [facility] imaging   Please send me a message in MyChart with any questions or updates.  We'll setup a virtual visit once the MRi is resulted.   --Dr. Jordan Likes
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
## 2022-07-28 NOTE — Assessment & Plan Note (Signed)
Did receive some improvement with the injection.  Having altered sensation of the first 3 digits. -Counseled on home exercise therapy and supportive care. -Can continue physical therapy. -Could consider nerve study.

## 2022-08-03 ENCOUNTER — Ambulatory Visit: Payer: Managed Care, Other (non HMO)

## 2022-08-04 ENCOUNTER — Ambulatory Visit: Payer: Managed Care, Other (non HMO) | Admitting: Physical Therapy

## 2022-08-04 ENCOUNTER — Encounter: Payer: Self-pay | Admitting: Physical Therapy

## 2022-08-04 DIAGNOSIS — M25511 Pain in right shoulder: Secondary | ICD-10-CM

## 2022-08-04 DIAGNOSIS — M542 Cervicalgia: Secondary | ICD-10-CM

## 2022-08-04 DIAGNOSIS — M5412 Radiculopathy, cervical region: Secondary | ICD-10-CM

## 2022-08-04 DIAGNOSIS — R293 Abnormal posture: Secondary | ICD-10-CM

## 2022-08-04 NOTE — Therapy (Signed)
OUTPATIENT PHYSICAL THERAPY CERVICAL EVALUATION   Patient Name: Brian Obrien MRN: [id number] DOB:June 14, 1986, 36 y.o., male Today's Date: 08/04/2022   PT End of Session - 08/04/22 1451     Visit Number 2    Number of Visits 12    Date for PT Re-Evaluation 09/08/22    Authorization Type [employer] [id number]    PT Start Time 1450    PT Stop Time 1530    PT Time Calculation (min) 40 min    Activity Tolerance Patient tolerated treatment well    Behavior During Therapy [facility] for tasks assessed/performed             Past Medical History:  Diagnosis Date   Allergy    Anxiety    Chest pain    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   Chronic neck pain 11/12/2019   Chronic pain of left knee 11/12/2019   Dependence on nicotine from chewing tobacco 12/15/2017   Depression    Fatty liver    GERD (gastroesophageal reflux disease)    Hyperlipidemia    Left hip pain    Left sided sciatica 03/15/2018   Lytic bone lesions on xray 02/07/2018   Formatting of this note might be different from the original. Of right iliac crest.  Seen on x-ray in 1-19 and CT on 02/05/2018. Unchanged xray in 03/2018. Repeat in 6 months (CT vs Xray)   Metabolic syndrome 12/25/2019   Obesity (BMI 30-39.9) 12/15/2017   OSA (obstructive sleep apnea) 12/15/2017   2015; unable to tolerate CPAP   Palpitations    Severe obesity (BMI 35.0-39.9) with comorbidity (HCC) 11/12/2019   Sleep apnea    SOB (shortness of breath)    Vitamin B 12 deficiency    Vitamin D deficiency    Past Surgical History:  Procedure Laterality Date   HAIR TRANSPLANT     Patient Active Problem List   Diagnosis Date Noted   Capsulitis of right shoulder 06/30/2022   Carpal tunnel syndrome, right 06/30/2022   Tear of PCL (posterior cruciate ligament) of knee, left, subsequent encounter 07/08/2021   Rupture of anterior cruciate ligament of right knee 07/08/2021   Tear of LCL (lateral collateral ligament) of knee, right,  initial encounter 07/08/2021   Anxiety 07/08/2021   Low testosterone 04/02/2021   Polyphagia 03/24/2021   Seasonal allergies 11/27/2020   Other hyperlipidemia 11/04/2020   Depression 09/01/2020   Vitamin D deficiency 08/14/2020   Insulin resistance 07/07/2020   Pain of joint of left ankle and foot 05/09/2020   Joint pain 04/03/2020   Metabolic syndrome 12/25/2019   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   Cervical radiculopathy 11/12/2019   Chronic pain of left knee 11/12/2019   Class 2 severe obesity with serious comorbidity and body mass index (BMI) of 36.0 to 36.9 in adult [facility]) 11/12/2019   Left sided sciatica 03/15/2018   Lytic bone lesions on xray 02/07/2018   Dependence on nicotine from chewing tobacco 12/15/2017   Obesity (BMI 30-39.9) 12/15/2017   OSA (obstructive sleep apnea) 12/15/2017    PCP: Esperanza Richters, PA-C  REFERRING PROVIDER: Myra Rude, MD  REFERRING DIAG: M77.8 (ICD-10-CM) - Capsulitis of right shoulder M54.12 (ICD-10-CM) - Cervical radiculopathy   THERAPY DIAG:  Cervicalgia  Radiculopathy, cervical region  Abnormal posture  Acute pain of right shoulder  Rationale for Evaluation and Treatment Rehabilitation  ONSET DATE: ~2-3 months ago  SUBJECTIVE:  SUBJECTIVE STATEMENT: Patient reports no changes since initial evaluation.  He does his exercises when he remembers.   PERTINENT HISTORY:  06/30/22- R wrist injected for R Carpal tunnel PMH: L AC joint separation/surgical repair, R ACL reconstruction, L PCL tear; GERD, obesity  PAIN:  Are you having pain? Yes: NPRS scale: 2/10 Pain location: neck radiating down R shoulder, numbness and tingling into 1st 3 fingers on R.  Pain description: up 6-7 when sleeping on  side Aggravating factors: moving, trying to sleep on side, getting hugs Relieving factors: ibuprofen, massage  PRECAUTIONS: None  WEIGHT BEARING RESTRICTIONS No  FALLS:  Has patient fallen in last 6 months? No  LIVING ENVIRONMENT: Lives with: lives with their family Lives in: House/apartment Stairs: Yes: Internal: 7 steps; on right going up, on left going up, and can reach both and External: 6 steps; on right going up, on left going up, and can reach both Has following equipment at home: None  OCCUPATION: MRI tech for [employer]  PLOF: Independent  PATIENT GOALS get rid of the neck pain and better shoulder mobility  OBJECTIVE:   DIAGNOSTIC FINDINGS:  06/30/2022  DG cervical spine FINDINGS: The cervical spine is visualized from the skull base through the superior aspect of C7. There is no evidence of cervical spine fracture or prevertebral soft tissue swelling. Straightening and mild reversal of the normal cervical lordosis. No listhesis. No significant change in intervertebral disc height no other significant bone abnormalities are identified. DG Right  Shoulder 06/30/2022 IMPRESSION: Negative.  PATIENT SURVEYS:  NDI 14/50= 28% disability    COGNITION: Overall cognitive status: Within functional limits for tasks assessed   SENSATION: Numbness/tingling into 1st 3 fingers R hand  POSTURE: rounded shoulders, forward head, and increased thoracic kyphosis  PALPATION: Tenderness R shoulder levator scapulae, R AC joint, R infraspinatus, tightness throughout bil cervical paraspinals, hypomobility throughout cervical spine.     CERVICAL ROM:   Active ROM AROM (deg) eval  Flexion 45*  Extension 40  Right lateral flexion 30*  Left lateral flexion 30  Right rotation 70  Left rotation 65   (Blank rows = not tested)* pain  UPPER EXTREMITY ROM:  full R shoulder ROM without pain.  Noted tightness in horizontal abduction L shoulder but history of AC tear and surgical  repair with complications.  Pain with movement only in L shoulder.    UPPER EXTREMITY MMT:  MMT Right eval Left eval  Shoulder flexion 5 5  Shoulder extension 5 5  Shoulder abduction 5 5  Shoulder adduction 5 5  Shoulder extension 5 5  Shoulder internal rotation 5 5  Shoulder external rotation 5 5  Elbow flexion 5 5  Elbow extension 5 5  Wrist flexion 5 5  Wrist extension 5 5  Wrist ulnar deviation 5 5  Wrist radial deviation 5 5  Wrist pronation 5 5  Wrist supination 5 5  Grip strength lbs 70 (65, 75, 70) 80    (Blank rows = not tested)  CERVICAL SPECIAL TESTS:  Spurling's test: Negative   FUNCTIONAL TESTS:  NA   TODAY'S TREATMENT:  08/04/2022 Therapeutic Exercise: to improve strength and mobility.  Demo, verbal and tactile cues throughout for technique. UBE L1 x 6 min (48f/3b) Thoracic mobs foam roller - crosswise Lengthwise foam roller - arm extensions x 10 bil, Y-arms x 10, angels x 10, chest stretch S/L open books x 10 At wall - open book stretch x 10, wall angels x 10  Manual Therapy: to  decrease muscle spasm and pain and improve mobility STM/TPR to R UT, levator scapulae; skilled palpation and monitoring during dry needling. Trigger Point Dry-Needling  Treatment instructions: Expect mild to moderate muscle soreness. S/S of pneumothorax if dry needled over a lung field, and to seek immediate medical attention should they occur. Patient verbalized understanding of these instructions and education.  Patient Consent Given: Yes Education handout provided: Yes Muscles treated: R UT, R levator, R supraspinatus Treatment response/outcome: Palpable Increase in Muscle Length   07/28/2022 - Self Care: see patient education on posture, sleeping strategies, carpal tunnel Therapeutic Exercise: to improve strength and mobility.  Demo, verbal and tactile cues throughout for technique. - Median Nerve Flossing  - 10 reps - Seated Cervical Retraction - 5 reps - Seated Scapular  Retraction  -  5 reps - Seated Shoulder Rolls   - 10 reps   PATIENT EDUCATION:  Education details: progressed HEP, education on DN Person educated: Patient Education method: Programmer, multimedia, Facilities manager, Verbal cues, and Handouts Education comprehension: verbalized understanding and returned demonstration   HOME EXERCISE PROGRAM: Access Code: [email or web address]  ASSESSMENT:  CLINICAL IMPRESSION: Cincere Gullatt reports no change since initial evaluation, still having pain and numbness down RUE.  He tolerated progression of exercises today focusing on thoracic mobilization very well, reporting decreased tightness following.  After explanation of DN rational, procedures, outcomes and potential side effects, patient verbalized consent to DN treatment in conjunction with manual STM/DTM and TPR to reduce ttp/muscle tension. Muscles treated as indicated above. DN produced normal response elicited resulting in palpable reduction in pain/ttp and muscle tension, with patient noting less pain upon initiation of movement following DN. Pt educated to expect mild to moderate muscle soreness for up to 24-48 hrs and instructed to continue prescribed home exercise program and current activity level with pt verbalizing understanding of theses instructions.   Harrel Ferrone continues to demonstrate potential for improvement and would benefit from continued skilled therapy to address impairments.        OBJECTIVE IMPAIRMENTS decreased strength, hypomobility, increased fascial restrictions, increased muscle spasms, impaired sensation, postural dysfunction, and pain.   ACTIVITY LIMITATIONS lifting, sleeping, and caring for others  PARTICIPATION LIMITATIONS: cleaning, driving, shopping, community activity, and occupation  PERSONAL FACTORS 3+ comorbidities: history chronic neck and back pain, L shoulder injury, chronic knee pain, metabolic syndrome  are also affecting patient's functional outcome.   REHAB POTENTIAL:  Good  CLINICAL DECISION MAKING: Stable/uncomplicated  EVALUATION COMPLEXITY: Low   GOALS: Goals reviewed with patient? Yes  SHORT TERM GOALS: Target date: 08/11/2022   Patient will be independent with initial HEP.  Baseline: given Goal status: IN PROGRESS  2.  Patient will report 50% improvement in RUE numbness/tingling  Baseline:  Goal status: IN PROGRESS   LONG TERM GOALS: Target date: 09/08/2022   Patient will be independent with advanced/ongoing HEP to improve outcomes and carryover.  Baseline: needs progression Goal status: IN PROGRESS  2.  Patient will report 75% improvement in neck pain to improve QOL.  Baseline:  Goal status: IN PROGRESS  3.  Patient will demonstrate full pain free cervical ROM for safety with driving.  Baseline: see objective Goal status: IN PROGRESS  4.  Patient will demonstrate improved posture to decrease muscle imbalance. Baseline: forward head, rounded shoulders, slouches Goal status: IN PROGRESS  5.  Patient will report 9/50 or less on NDI to demonstrate improved functional ability.  Baseline: 14/50 Goal status: IN PROGRESS  6.  Patient will demonstrate R grip strength >80 psi.  Baseline: R 70 psi, L hand in 80, right hand dominant Goal status: IN PROGRESS   7. Patient will report 50% improvement in headaches Baseline: moderate frequent headaches Goal status: IN PROGRESS    PLAN: PT FREQUENCY: 1-2x/week  PT DURATION: 6 weeks  PLANNED INTERVENTIONS: Therapeutic exercises, Therapeutic activity, Neuromuscular re-education, Balance training, Gait training, Patient/Family education, Self Care, Joint mobilization, Dry Needling, Electrical stimulation, Spinal mobilization, Cryotherapy, Moist heat, Traction, Ultrasound, Manual therapy, and Re-evaluation  PLAN FOR NEXT SESSION:  review and progress HEP, postural strengthening, manual therapy, modalities PRN.  Can trial cervical traction. TrDN to  left side levator, cervical  multifidus,   Jena Gauss, PT, DPT  08/04/2022, 3:40 PM

## 2022-08-05 ENCOUNTER — Ambulatory Visit: Payer: Managed Care, Other (non HMO)

## 2022-08-05 DIAGNOSIS — R293 Abnormal posture: Secondary | ICD-10-CM

## 2022-08-05 DIAGNOSIS — M542 Cervicalgia: Secondary | ICD-10-CM

## 2022-08-05 DIAGNOSIS — M5412 Radiculopathy, cervical region: Secondary | ICD-10-CM

## 2022-08-05 DIAGNOSIS — M25511 Pain in right shoulder: Secondary | ICD-10-CM

## 2022-08-05 NOTE — Therapy (Signed)
OUTPATIENT PHYSICAL THERAPY TREATMENT   Patient Name: Brian Obrien MRN: [id number] DOB:27-Sep-1986, 36 y.o., male Today's Date: 08/05/2022   PT End of Session - 08/05/22 0931     Visit Number 3    Number of Visits 12    Date for PT Re-Evaluation 09/08/22    Authorization Type [employer] [id number]    PT Start Time 0850    PT Stop Time 0930    PT Time Calculation (min) 40 min    Activity Tolerance Patient tolerated treatment well    Behavior During Therapy [facility] for tasks assessed/performed              Past Medical History:  Diagnosis Date   Allergy    Anxiety    Chest pain    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   Chronic neck pain 11/12/2019   Chronic pain of left knee 11/12/2019   Dependence on nicotine from chewing tobacco 12/15/2017   Depression    Fatty liver    GERD (gastroesophageal reflux disease)    Hyperlipidemia    Left hip pain    Left sided sciatica 03/15/2018   Lytic bone lesions on xray 02/07/2018   Formatting of this note might be different from the original. Of right iliac crest.  Seen on x-ray in 1-19 and CT on 02/05/2018. Unchanged xray in 03/2018. Repeat in 6 months (CT vs Xray)   Metabolic syndrome 12/25/2019   Obesity (BMI 30-39.9) 12/15/2017   OSA (obstructive sleep apnea) 12/15/2017   2015; unable to tolerate CPAP   Palpitations    Severe obesity (BMI 35.0-39.9) with comorbidity (HCC) 11/12/2019   Sleep apnea    SOB (shortness of breath)    Vitamin B 12 deficiency    Vitamin D deficiency    Past Surgical History:  Procedure Laterality Date   HAIR TRANSPLANT     Patient Active Problem List   Diagnosis Date Noted   Capsulitis of right shoulder 06/30/2022   Carpal tunnel syndrome, right 06/30/2022   Tear of PCL (posterior cruciate ligament) of knee, left, subsequent encounter 07/08/2021   Rupture of anterior cruciate ligament of right knee 07/08/2021   Tear of LCL (lateral collateral ligament) of knee, right, initial  encounter 07/08/2021   Anxiety 07/08/2021   Low testosterone 04/02/2021   Polyphagia 03/24/2021   Seasonal allergies 11/27/2020   Other hyperlipidemia 11/04/2020   Depression 09/01/2020   Vitamin D deficiency 08/14/2020   Insulin resistance 07/07/2020   Pain of joint of left ankle and foot 05/09/2020   Joint pain 04/03/2020   Metabolic syndrome 12/25/2019   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   Cervical radiculopathy 11/12/2019   Chronic pain of left knee 11/12/2019   Class 2 severe obesity with serious comorbidity and body mass index (BMI) of 36.0 to 36.9 in adult [facility]) 11/12/2019   Left sided sciatica 03/15/2018   Lytic bone lesions on xray 02/07/2018   Dependence on nicotine from chewing tobacco 12/15/2017   Obesity (BMI 30-39.9) 12/15/2017   OSA (obstructive sleep apnea) 12/15/2017    PCP: Esperanza Richters, PA-C  REFERRING PROVIDER: Myra Rude, MD  REFERRING DIAG: M77.8 (ICD-10-CM) - Capsulitis of right shoulder M54.12 (ICD-10-CM) - Cervical radiculopathy   THERAPY DIAG:  Cervicalgia  Radiculopathy, cervical region  Abnormal posture  Acute pain of right shoulder  Rationale for Evaluation and Treatment Rehabilitation  ONSET DATE: ~2-3 months ago  SUBJECTIVE:  SUBJECTIVE STATEMENT: Pt reports everything went well yesterday.  PERTINENT HISTORY:  06/30/22- R wrist injected for R Carpal tunnel PMH: L AC joint separation/surgical repair, R ACL reconstruction, L PCL tear; GERD, obesity  PAIN:  Are you having pain? Yes: NPRS scale: 2/10 Pain location: neck radiating down R shoulder, numbness and tingling into 1st 3 fingers on R.  Pain description: up 6-7 when sleeping on side Aggravating factors: moving, trying to sleep on side,  getting hugs Relieving factors: ibuprofen, massage  PRECAUTIONS: None  WEIGHT BEARING RESTRICTIONS No  FALLS:  Has patient fallen in last 6 months? 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 There is no evidence of cervical spine fracture or prevertebral soft tissue swelling. Straightening and mild reversal of the normal cervical lordosis. No listhesis. No significant change in intervertebral disc height no other significant bone abnormalities are identified. DG Right  Shoulder 06/30/2022 IMPRESSION: Negative.  PATIENT SURVEYS:  NDI 14/50= 28% disability    COGNITION: Overall cognitive status: Within functional limits for tasks assessed   SENSATION: Numbness/tingling into 1st 3 fingers R hand  POSTURE: rounded shoulders, forward head, and increased thoracic kyphosis  PALPATION: Tenderness R shoulder levator scapulae, R AC joint, R infraspinatus, tightness throughout bil cervical paraspinals, hypomobility throughout cervical spine.     CERVICAL ROM:   Active ROM AROM (deg) eval  Flexion 45*  Extension 40  Right lateral flexion 30*  Left lateral flexion 30  Right rotation 70  Left rotation 65   (Blank rows = not tested)* pain  UPPER EXTREMITY ROM:  full R shoulder ROM without pain.  Noted tightness in horizontal abduction L shoulder but history of AC tear and surgical repair with complications.  Pain with movement only in L  shoulder.    UPPER EXTREMITY MMT:  MMT Right eval Left eval  Shoulder flexion 5 5  Shoulder extension 5 5  Shoulder abduction 5 5  Shoulder adduction 5 5  Shoulder extension 5 5  Shoulder internal rotation 5 5  Shoulder external rotation 5 5  Elbow flexion 5 5  Elbow extension 5 5  Wrist flexion 5 5  Wrist extension 5 5  Wrist ulnar deviation 5 5  Wrist radial deviation 5 5  Wrist pronation 5 5  Wrist supination 5 5  Grip strength lbs 70 (65, 75, 70) 80    (Blank rows = not tested)  CERVICAL SPECIAL TESTS:  Spurling's test: Negative   FUNCTIONAL TESTS:  NA   TODAY'S TREATMENT:  08/05/22 TherEx: UBE L1 37f/3b Median nerve glide x 10 3 way pec stretch x 30 doorway (low, mid, high) Seated SNAG ext and bil rotation x 10  Seated thoracic ext with foam roll arms behind head x 10 Seated horizontal ABD red TB 2x10  Manual Therapy: STM and TPR to R pec minor   08/04/2022 Therapeutic Exercise: to improve strength and mobility.  Demo, verbal and tactile cues throughout for technique. UBE L1  x 6 min (88f/3b) Thoracic mobs foam roller - crosswise Lengthwise foam roller - arm extensions x 10 bil, Y-arms x 10, angels x 10, chest stretch S/L open books x 10 At wall - open book stretch x 10, wall angels x 10  Manual Therapy: to decrease muscle spasm and pain and improve mobility STM/TPR to R UT, levator scapulae; skilled palpation and monitoring during dry needling. Trigger Point Dry-Needling  Treatment instructions: Expect mild to moderate muscle soreness. S/S of pneumothorax if dry needled over a lung field, and to seek immediate medical attention should they occur. Patient verbalized understanding of these instructions and education.  Patient Consent Given: Yes Education handout provided: Yes Muscles treated: R UT, R levator, R supraspinatus Treatment response/outcome: Palpable Increase in Muscle Length   07/28/2022 - Self Care: see patient education on posture, sleeping  strategies, carpal tunnel Therapeutic Exercise: to improve strength and mobility.  Demo, verbal and tactile cues throughout for technique. - Median Nerve Flossing  - 10 reps - Seated Cervical Retraction - 5 reps - Seated Scapular Retraction  -  5 reps - Seated Shoulder Rolls   - 10 reps   PATIENT EDUCATION:  Education details: progressed HEP, education on DN Person educated: Patient Education method: Programmer, multimedia, Facilities manager, Verbal cues, and Handouts Education comprehension: verbalized understanding and returned demonstration   HOME EXERCISE PROGRAM: Access Code: [email or web address]  ASSESSMENT:  CLINICAL IMPRESSION: Pt demonstrated a good response to treatment, notes that DN helped and wants to do it again. Added in pec stretches to address slouched posture observed today. Also initiated postural strengthening with TB. TTP found along pec minor on R side. Pt would continue to benefit from chest opening and posterior strengthening.      OBJECTIVE IMPAIRMENTS decreased strength, hypomobility, increased fascial restrictions, increased muscle spasms, impaired sensation, postural dysfunction, and pain.   ACTIVITY LIMITATIONS lifting, sleeping, and caring for others  PARTICIPATION LIMITATIONS: cleaning, driving, shopping, community activity, and occupation  PERSONAL FACTORS 3+ comorbidities: history chronic neck and back pain, L shoulder injury, chronic knee pain, metabolic syndrome  are also affecting patient's functional outcome.   REHAB POTENTIAL: Good  CLINICAL DECISION MAKING: Stable/uncomplicated  EVALUATION COMPLEXITY: Low   GOALS: Goals reviewed with patient? Yes  SHORT TERM GOALS: Target date: 08/11/2022   Patient will be independent with initial HEP.  Baseline: given Goal status: IN PROGRESS  2.  Patient will report 50% improvement in RUE numbness/tingling  Baseline:  Goal status: IN PROGRESS   LONG TERM GOALS: Target date: 09/08/2022   Patient will be independent  with advanced/ongoing HEP to improve outcomes and carryover.  Baseline: needs progression Goal status: IN PROGRESS  2.  Patient will report 75% improvement in neck pain to improve QOL.  Baseline:  Goal status: IN PROGRESS  3.  Patient will demonstrate full pain free cervical ROM for safety with driving.  Baseline: see objective Goal status: IN PROGRESS  4.  Patient will demonstrate improved posture to decrease muscle imbalance. Baseline: forward head, rounded shoulders, slouches Goal status: IN PROGRESS  5.  Patient will report 9/50 or less on NDI to demonstrate improved functional ability.  Baseline: 14/50 Goal status: IN PROGRESS  6.  Patient will demonstrate R grip strength >80 psi.    Baseline: R 70 psi, L hand in 80, right hand dominant Goal status: IN PROGRESS   7. Patient will report 50% improvement in headaches Baseline: moderate frequent headaches Goal status: IN PROGRESS    PLAN: PT FREQUENCY: 1-2x/week  PT DURATION:  6 weeks  PLANNED INTERVENTIONS: Therapeutic exercises, Therapeutic activity, Neuromuscular re-education, Balance training, Gait training, Patient/Family education, Self Care, Joint mobilization, Dry Needling, Electrical stimulation, Spinal mobilization, Cryotherapy, Moist heat, Traction, Ultrasound, Manual therapy, and Re-evaluation  PLAN FOR NEXT SESSION:  review and progress HEP, postural strengthening, manual therapy, modalities PRN.  Can trial cervical traction. TrDN to  left side levator, cervical multifidus,   Darleene Cleaver, PTA 08/05/2022, 9:32 AM

## 2022-08-07 ENCOUNTER — Ambulatory Visit
Admission: RE | Admit: 2022-08-07 | Discharge: 2022-08-07 | Disposition: A | Discharge status: Home / Self Care | Payer: Managed Care, Other (non HMO) | Source: Ambulatory Visit | Attending: Family Medicine | Admitting: Family Medicine

## 2022-08-07 DIAGNOSIS — M5412 Radiculopathy, cervical region: Secondary | ICD-10-CM

## 2022-08-09 ENCOUNTER — Encounter: Payer: Managed Care, Other (non HMO) | Admitting: Physical Therapy

## 2022-08-10 ENCOUNTER — Telehealth (INDEPENDENT_AMBULATORY_CARE_PROVIDER_SITE_OTHER): Payer: Managed Care, Other (non HMO) | Admitting: Family Medicine

## 2022-08-10 DIAGNOSIS — M5412 Radiculopathy, cervical region: Secondary | ICD-10-CM

## 2022-08-10 NOTE — Progress Notes (Signed)
Virtual Visit via Video Note  I connected with Brian Obrien on 08/10/22 at  1:10 PM EDT by a video enabled telemedicine application and verified that I am speaking with the correct person using two identifiers.  Location: Patient: work Provider: office   I discussed the limitations of evaluation and management by telemedicine and the availability of in person appointments. The patient expressed understanding and agreed to proceed.  History of Present Illness:  Brian Obrien is a 36 yo M following up for his MRI of his cervical spine.  This was demonstrating mild degenerative changes at C6/7 with borderline mild canal stenosis at that level which was observed on the previous MRI.  He has been in physical therapy and received some relief with dry needling.   Observations/Objective:   Assessment and Plan:  Cervical radiculopathy: He is having some improvement of his symptoms.  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  Has been getting relief with physical therapy. -Counseled on home exercise therapy and supportive care. -Continue physical therapy. -Could consider epidural.  Follow Up Instructions:    I discussed the assessment and treatment plan with the patient. The patient was provided an opportunity to ask questions and all were answered. The patient agreed with the plan and demonstrated an understanding of the instructions.   The patient was advised to call back or seek an in-person evaluation if the symptoms worsen or if the condition fails to improve as anticipated.    Clearance Coots, MD

## 2022-08-10 NOTE — Assessment & Plan Note (Signed)
He is having some improvement of his symptoms.  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  Has been getting relief with physical therapy. -Counseled on home exercise therapy and supportive care. -Continue physical therapy. -Could consider epidural.

## 2022-08-11 ENCOUNTER — Encounter: Payer: Self-pay | Admitting: Physical Therapy

## 2022-08-11 ENCOUNTER — Ambulatory Visit: Payer: Managed Care, Other (non HMO) | Admitting: Physical Therapy

## 2022-08-11 DIAGNOSIS — R293 Abnormal posture: Secondary | ICD-10-CM

## 2022-08-11 DIAGNOSIS — M542 Cervicalgia: Secondary | ICD-10-CM | POA: Diagnosis not present

## 2022-08-11 DIAGNOSIS — M25511 Pain in right shoulder: Secondary | ICD-10-CM

## 2022-08-11 DIAGNOSIS — M5412 Radiculopathy, cervical region: Secondary | ICD-10-CM

## 2022-08-11 NOTE — Therapy (Signed)
OUTPATIENT PHYSICAL THERAPY TREATMENT   Patient Name: Brian Obrien MRN: [id number] DOB:23-Jan-1986, 36 y.o., male Today's Date: 08/11/2022   PT End of Session - 08/11/22 1531     Visit Number 4    Number of Visits 12    Date for PT Re-Evaluation 09/08/22    Authorization Type [employer] [id number]    PT Start Time 1531    PT Stop Time 1615    PT Time Calculation (min) 44 min    Activity Tolerance Patient tolerated treatment well    Behavior During Therapy [facility] for tasks assessed/performed              Past Medical History:  Diagnosis Date   Allergy    Anxiety    Chest pain    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   Chronic neck pain 11/12/2019   Chronic pain of left knee 11/12/2019   Dependence on nicotine from chewing tobacco 12/15/2017   Depression    Fatty liver    GERD (gastroesophageal reflux disease)    Hyperlipidemia    Left hip pain    Left sided sciatica 03/15/2018   Lytic bone lesions on xray 02/07/2018   Formatting of this note might be different from the original. Of right iliac crest.  Seen on x-ray in 1-19 and CT on 02/05/2018. Unchanged xray in 03/2018. Repeat in 6 months (CT vs Xray)   Metabolic syndrome 12/25/2019   Obesity (BMI 30-39.9) 12/15/2017   OSA (obstructive sleep apnea) 12/15/2017   2015; unable to tolerate CPAP   Palpitations    Severe obesity (BMI 35.0-39.9) with comorbidity (HCC) 11/12/2019   Sleep apnea    SOB (shortness of breath)    Vitamin B 12 deficiency    Vitamin D deficiency    Past Surgical History:  Procedure Laterality Date   HAIR TRANSPLANT     Patient Active Problem List   Diagnosis Date Noted   Capsulitis of right shoulder 06/30/2022   Carpal tunnel syndrome, right 06/30/2022   Tear of PCL (posterior cruciate ligament) of knee, left, subsequent encounter 07/08/2021   Rupture of anterior cruciate ligament of right knee 07/08/2021   Tear of LCL (lateral collateral ligament) of knee, right, initial  encounter 07/08/2021   Anxiety 07/08/2021   Low testosterone 04/02/2021   Polyphagia 03/24/2021   Seasonal allergies 11/27/2020   Other hyperlipidemia 11/04/2020   Depression 09/01/2020   Vitamin D deficiency 08/14/2020   Insulin resistance 07/07/2020   Pain of joint of left ankle and foot 05/09/2020   Joint pain 04/03/2020   Metabolic syndrome 12/25/2019   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   Cervical radiculopathy 11/12/2019   Chronic pain of left knee 11/12/2019   Class 2 severe obesity with serious comorbidity and body mass index (BMI) of 36.0 to 36.9 in adult [facility](HCC) 11/12/2019   Left sided sciatica 03/15/2018   Lytic bone lesions on xray 02/07/2018   Dependence on nicotine from chewing tobacco 12/15/2017   Obesity (BMI 30-39.9) 12/15/2017   OSA (obstructive sleep apnea) 12/15/2017    PCP: Esperanza RichtersSaguier, Edward, PA-C  REFERRING PROVIDER: Myra RudeSchmitz, Jeremy E, MD  REFERRING DIAG: M77.8 (ICD-10-CM) - Capsulitis of right shoulder M54.12 (ICD-10-CM) - Cervical radiculopathy   THERAPY DIAG:  Cervicalgia  Radiculopathy, cervical region  Abnormal posture  Acute pain of right shoulder  Rationale for Evaluation and Treatment Rehabilitation  ONSET DATE: ~2-3 months ago  SUBJECTIVE:  SUBJECTIVE STATEMENT: Brian Obrien got his MRI report showing mild right foraminal stenosis at C6-7.  He reports symptoms are improving, felt DN was very helpful and would like to do more.    PERTINENT HISTORY:  06/30/22- R wrist injected for R Carpal tunnel PMH: L AC joint separation/surgical repair, R ACL reconstruction, L PCL tear; GERD, obesity  PAIN:  Are you having pain? Yes: NPRS scale: 0/10 Pain location: numbness in R fingers, occasional jolt of pain.  Pain  description: up 6-7 when sleeping on side Aggravating factors: moving, trying to sleep on side, getting hugs Relieving factors: ibuprofen, massage  PRECAUTIONS: None  WEIGHT BEARING RESTRICTIONS No  FALLS:  Has patient fallen in last 6 months? No  LIVING ENVIRONMENT: Lives with: lives with their family Lives in: House/apartment Stairs: Yes: Internal: 7 steps; on right going up, on left going up, and can reach both and External: 6 steps; on right going up, on left going up, and can reach both Has following equipment at home: None  OCCUPATION: MRI tech for [employer]  PLOF: Independent  PATIENT GOALS get rid of the neck pain and better shoulder mobility  OBJECTIVE:   DIAGNOSTIC FINDINGS:  06/30/2022  DG cervical spine FINDINGS: The cervical spine is visualized from the skull base through the superior aspect of C7. There is no evidence of cervical spine fracture or prevertebral soft tissue swelling. Straightening and mild reversal of the normal cervical lordosis. No listhesis. No significant change in intervertebral disc height no other significant bone abnormalities are identified. DG Right  Shoulder 06/30/2022 IMPRESSION: Negative.  PATIENT SURVEYS:  NDI 14/50= 28% disability    COGNITION: Overall cognitive status: Within functional limits for tasks assessed   SENSATION: Numbness/tingling into 1st 3 fingers R hand  POSTURE: rounded shoulders, forward head, and increased thoracic kyphosis  PALPATION: Tenderness R shoulder levator scapulae, R AC joint, R infraspinatus, tightness throughout bil cervical paraspinals, hypomobility throughout cervical spine.     CERVICAL ROM:   Active ROM AROM (deg) eval  Flexion 45*  Extension 40  Right lateral flexion 30*  Left lateral flexion 30  Right rotation 70  Left rotation 65   (Blank rows = not tested)* pain  UPPER EXTREMITY ROM:  full R shoulder ROM without pain.  Noted tightness in horizontal abduction L shoulder  but history of AC tear and surgical repair with complications.  Pain with movement only in L shoulder.    UPPER EXTREMITY MMT:  MMT Right eval Left eval  Shoulder flexion 5 5  Shoulder extension 5 5  Shoulder abduction 5 5  Shoulder adduction 5 5  Shoulder extension 5 5  Shoulder internal rotation 5 5  Shoulder external rotation 5 5  Elbow flexion 5 5  Elbow extension 5 5  Wrist flexion 5 5  Wrist extension 5 5  Wrist ulnar deviation 5 5  Wrist radial deviation 5 5  Wrist pronation 5 5  Wrist supination 5 5  Grip strength lbs 70 (65, 75, 70) 80    (Blank rows = not tested)  CERVICAL SPECIAL TESTS:  Spurling's test: Negative   FUNCTIONAL TESTS:  NA   TODAY'S TREATMENT:  08/11/2022 Therapeutic Exercise: to improve strength and mobility.   UBE L2 76f/3b Cervical retraction with resistance RTB Cervical s/b with resistance RTB Upper trap and levator stretches.   Manual Therapy: to decrease muscle spasm and improve mobility STM/TPR to bil cervical paraspinals, UT, levator, R pectoralis Trigger Point Dry-Needling  Treatment instructions: Expect mild to  moderate muscle soreness. S/S of pneumothorax if dry needled over a lung field, and to seek immediate medical attention should they occur. Patient verbalized understanding of these instructions and education.  Patient Consent Given: Yes Education handout provided: Previously provided Muscles treated: bil UT, levator, cervical multifidi C5-7, R pectoralis Treatment response/outcome: Twitch Response Elicited and Palpable Increase in Muscle Length   08/05/22 TherEx: UBE L1 62f/3b Median nerve glide x 10 3 way pec stretch x 30 doorway (low, mid, high) Seated SNAG ext and bil rotation x 10  Seated thoracic ext with foam roll arms behind head x 10 Seated horizontal ABD red TB 2x10  Manual Therapy: STM and TPR to R pec minor   08/04/2022 Therapeutic Exercise: to improve strength and mobility.  Demo, verbal and tactile cues  throughout for technique. UBE L1 x 6 min (64f/3b) Thoracic mobs foam roller - crosswise Lengthwise foam roller - arm extensions x 10 bil, Y-arms x 10, angels x 10, chest stretch S/L open books x 10 At wall - open book stretch x 10, wall angels x 10  Manual Therapy: to decrease muscle spasm and pain and improve mobility STM/TPR to R UT, levator scapulae; skilled palpation and monitoring during dry needling. Trigger Point Dry-Needling  Treatment instructions: Expect mild to moderate muscle soreness. S/S of pneumothorax if dry needled over a lung field, and to seek immediate medical attention should they occur. Patient verbalized understanding of these instructions and education.  Patient Consent Given: Yes Education handout provided: Yes Muscles treated: R UT, R levator, R supraspinatus Treatment response/outcome: Palpable Increase in Muscle Length   07/28/2022 - Self Care: see patient education on posture, sleeping strategies, carpal tunnel Therapeutic Exercise: to improve strength and mobility.  Demo, verbal and tactile cues throughout for technique. - Median Nerve Flossing  - 10 reps - Seated Cervical Retraction - 5 reps - Seated Scapular Retraction  -  5 reps - Seated Shoulder Rolls   - 10 reps   PATIENT EDUCATION:  Education details: progressed HEP 08/11/22 Person educated: Patient Education method: Explanation, Demonstration, Verbal cues, and Handouts Education comprehension: verbalized understanding and returned demonstration   HOME EXERCISE PROGRAM: Access Code: [email or web address] URL: [email or web address] Date: 08/11/2022 Prepared by: Harrie Foreman  Exercises - Median Nerve Rocking  - 2 x daily - 7 x weekly - 1 sets - 10 reps - Median Nerve Glide  - 2 x daily - 7 x weekly - 1 sets - 10 reps - Median Nerve Flossing  - 2 x daily - 7 x weekly - 1 sets - 10 reps - Seated Cervical Retraction  - 3 x daily - 7 x weekly - 1 sets - 10 reps - Thoracic Mobilization on Foam  Roll - Hands Clasped  - 1 x daily - 7 x weekly - 2 sets - 10 reps - Thoracic Foam Roll Mobilization Backstroke  - 1 x daily - 7 x weekly - 2 sets - 10 reps - Thoracic Y on Foam Roll  - 1 x daily - 7 x weekly - 2 sets - 10 reps - [place] on Foam Roll  - 1 x daily - 7 x weekly - 2 sets - 10 reps - Sidelying Open Book Thoracic Lumbar Rotation and Extension  - 1 x daily - 7 x weekly - 1 sets - 10 reps - Standing Thoracic Open Book at Wall  - 1 x daily - 7 x weekly - 2 sets - 10 reps - Wall Angels  - 1 x  daily - 7 x weekly - 2 sets - 10 reps - Corner Pec Minor Stretch  - 1 x daily - 7 x weekly - 3 sets - 30 sec hold - Standing Shoulder Horizontal Abduction with Resistance  - 1 x daily - 7 x weekly - 3 sets - 10 reps - Cervical Retraction with Resistance  - 1 x daily - 7 x weekly - 2 sets - 10 reps - Standing Cervical Sidebending with Anchored Resistance  - 1 x daily - 7 x weekly - 2 sets - 10 reps   ASSESSMENT:  CLINICAL IMPRESSION: Brian Obrien reports decreased neck pain but still has some numbness in R finger tips.  Today focused on manual therapy and TrDN to bil UT,levator and cervical paraspinals, followed by progression of HEP.  He reported significant decrease in tightness following manual therapy.  Brian Obrien continues to demonstrate potential for improvement and would benefit from continued skilled therapy to address impairments.         OBJECTIVE IMPAIRMENTS decreased strength, hypomobility, increased fascial restrictions, increased muscle spasms, impaired sensation, postural dysfunction, and pain.   ACTIVITY LIMITATIONS lifting, sleeping, and caring for others  PARTICIPATION LIMITATIONS: cleaning, driving, shopping, community activity, and occupation  PERSONAL FACTORS 3+ comorbidities: history chronic neck and back pain, L shoulder injury, chronic knee pain, metabolic syndrome  are also affecting patient's functional outcome.   REHAB POTENTIAL: Good  CLINICAL  DECISION MAKING: Stable/uncomplicated  EVALUATION COMPLEXITY: Low   GOALS: Goals reviewed with patient? Yes  SHORT TERM GOALS: Target date: 08/11/2022   Patient will be independent with initial HEP.  Baseline: given Goal status: IN PROGRESS  2.  Patient will report 50% improvement in RUE numbness/tingling  Baseline:  Goal status: IN PROGRESS   LONG TERM GOALS: Target date: 09/08/2022   Patient will be independent with advanced/ongoing HEP to improve outcomes and carryover.  Baseline: needs progression Goal status: IN PROGRESS  2.  Patient will report 75% improvement in neck pain to improve QOL.  Baseline:  Goal status: IN PROGRESS  3.  Patient will demonstrate full pain free cervical ROM for safety with driving.  Baseline: see objective Goal status: IN PROGRESS  4.  Patient will demonstrate improved posture to decrease muscle imbalance. Baseline: forward head, rounded shoulders, slouches Goal status: IN PROGRESS  5.  Patient will report 9/50 or less on NDI to demonstrate improved functional ability.  Baseline: 14/50 Goal status: IN PROGRESS  6.  Patient will demonstrate R grip strength >80 psi.    Baseline: R 70 psi, L hand in 80, right hand dominant Goal status: IN PROGRESS   7. Patient will report 50% improvement in headaches Baseline: moderate frequent headaches Goal status: IN PROGRESS    PLAN: PT FREQUENCY: 1-2x/week  PT DURATION: 6 weeks  PLANNED INTERVENTIONS: Therapeutic exercises, Therapeutic activity, Neuromuscular re-education, Balance training, Gait training, Patient/Family education, Self Care, Joint mobilization, Dry Needling, Electrical stimulation, Spinal mobilization, Cryotherapy, Moist heat, Traction, Ultrasound, Manual therapy, and Re-evaluation  PLAN FOR NEXT SESSION:  review and progress HEP, postural strengthening, manual therapy, modalities PRN.    [name], PT, DPT  08/11/2022, 5:37 PM

## 2022-08-13 ENCOUNTER — Ambulatory Visit: Payer: Managed Care, Other (non HMO) | Admitting: Physical Therapy

## 2022-08-13 ENCOUNTER — Encounter: Payer: Self-pay | Admitting: Physical Therapy

## 2022-08-13 DIAGNOSIS — M542 Cervicalgia: Secondary | ICD-10-CM

## 2022-08-13 DIAGNOSIS — M5412 Radiculopathy, cervical region: Secondary | ICD-10-CM

## 2022-08-13 DIAGNOSIS — R293 Abnormal posture: Secondary | ICD-10-CM

## 2022-08-13 DIAGNOSIS — M25512 Pain in left shoulder: Secondary | ICD-10-CM

## 2022-08-13 DIAGNOSIS — M25511 Pain in right shoulder: Secondary | ICD-10-CM

## 2022-08-13 NOTE — Therapy (Signed)
OUTPATIENT PHYSICAL THERAPY TREATMENT   Patient Name: Brian Obrien MRN: [id number] DOB:[birth year], 36 y.o., male Today's Date: 08/13/2022   PT End of Session - 08/13/22 0857     Visit Number 5    Number of Visits 12    Date for PT Re-Evaluation 09/08/22    Authorization Type [employer] [id number]    PT Start Time 0855    PT Stop Time 0931    PT Time Calculation (min) 36 min    Activity Tolerance Patient tolerated treatment well    Behavior During Therapy [facility] for tasks assessed/performed              Past Medical History:  Diagnosis Date   Allergy    Anxiety    Chest pain    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   Chronic neck pain 11/12/2019   Chronic pain of left knee 11/12/2019   Dependence on nicotine from chewing tobacco 12/15/2017   Depression    Fatty liver    GERD (gastroesophageal reflux disease)    Hyperlipidemia    Left hip pain    Left sided sciatica 03/15/2018   Lytic bone lesions on xray 02/07/2018   Formatting of this note might be different from the original. Of right iliac crest.  Seen on x-ray in 1-19 and CT on 02/05/2018. Unchanged xray in 03/2018. Repeat in 6 months (CT vs Xray)   Metabolic syndrome [id number]   Obesity (BMI 30-39.9) 12/15/2017   OSA (obstructive sleep apnea) 12/15/2017   2015; unable to tolerate CPAP   Palpitations    Severe obesity (BMI 35.0-39.9) with comorbidity ([place]) 11/12/2019   Sleep apnea    SOB (shortness of breath)    Vitamin B 12 deficiency    Vitamin D deficiency    Past Surgical History:  Procedure Laterality Date   HAIR TRANSPLANT     Patient Active Problem List   Diagnosis Date Noted   Capsulitis of right shoulder 06/30/2022   Carpal tunnel syndrome, right 06/30/2022   Tear of PCL (posterior cruciate ligament) of knee, left, subsequent encounter 07/08/2021   Rupture of anterior cruciate ligament of right knee 07/08/2021   Tear of LCL (lateral collateral ligament) of knee, right, initial  encounter 07/08/2021   Anxiety 07/08/2021   Low testosterone 04/02/2021   Polyphagia 03/24/2021   Seasonal allergies 11/27/2020   Other hyperlipidemia 11/04/2020   Depression 09/01/2020   Vitamin D deficiency 08/14/2020   Insulin resistance 07/07/2020   Pain of joint of left ankle and foot 05/09/2020   Joint pain [id number]   Metabolic syndrome [id number]   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   Cervical radiculopathy 11/12/2019   Chronic pain of left knee 11/12/2019   Class 2 severe obesity with serious comorbidity and body mass index (BMI) of 36.0 to 36.9 in adult [facility]) 11/12/2019   Left sided sciatica 03/15/2018   Lytic bone lesions on xray 02/07/2018   Dependence on nicotine from chewing tobacco 12/15/2017   Obesity (BMI 30-39.9) 12/15/2017   OSA (obstructive sleep apnea) 12/15/2017    PCP: Mackie Pai, PA-C  REFERRING PROVIDER: [name], MD  REFERRING DIAG: M77.8 (ICD-10-CM) - Capsulitis of right shoulder M54.12 (ICD-10-CM) - Cervical radiculopathy   THERAPY DIAG:  Cervicalgia  Radiculopathy, cervical region  Abnormal posture  Acute pain of right shoulder  Acute pain of left shoulder  Rationale for Evaluation and Treatment Rehabilitation  ONSET DATE: ~2-3 months ago  SUBJECTIVE:  SUBJECTIVE STATEMENT: Brian Obrien reports more soreness after last dry needling but feels better today.    PERTINENT HISTORY:  06/30/22- R wrist injected for R Carpal tunnel PMH: L AC joint separation/surgical repair, R ACL reconstruction, L PCL tear; GERD, obesity  PAIN:  Are you having pain? Yes: NPRS scale: 0/10 Pain location: numbness in R fingers, occasional jolt of pain.  Pain description: up 6-7 when sleeping on side Aggravating  factors: moving, trying to sleep on side, getting hugs Relieving factors: ibuprofen, massage  PRECAUTIONS: None  WEIGHT BEARING RESTRICTIONS No  FALLS:  Has patient fallen in last 6 months? No  LIVING ENVIRONMENT: Lives with: lives with their family Lives in: House/apartment Stairs: Yes: Internal: 7 steps; on right going up, on left going up, and can reach both and External: 6 steps; on right going up, on left going up, and can reach both Has following equipment at home: None  OCCUPATION: MRI tech for [employer]  PLOF: Independent  PATIENT GOALS get rid of the neck pain and better shoulder mobility  OBJECTIVE:   DIAGNOSTIC FINDINGS:  06/30/2022  DG cervical spine FINDINGS: The cervical spine is visualized from the skull base through the superior aspect of C7. There is no evidence of cervical spine fracture or prevertebral soft tissue swelling. Straightening and mild reversal of the normal cervical lordosis. No listhesis. No significant change in intervertebral disc height no other significant bone abnormalities are identified. DG Right  Shoulder 06/30/2022 IMPRESSION: Negative.  PATIENT SURVEYS:  NDI 14/50= 28% disability    COGNITION: Overall cognitive status: Within functional limits for tasks assessed   SENSATION: Numbness/tingling into 1st 3 fingers R hand  POSTURE: rounded shoulders, forward head, and increased thoracic kyphosis  PALPATION: Tenderness R shoulder levator scapulae, R AC joint, R infraspinatus, tightness throughout bil cervical paraspinals, hypomobility throughout cervical spine.     CERVICAL ROM:   Active ROM AROM (deg) eval  Flexion 45*  Extension 40  Right lateral flexion 30*  Left lateral flexion 30  Right rotation 70  Left rotation 65   (Blank rows = not tested)* pain  UPPER EXTREMITY ROM:  full R shoulder ROM without pain.  Noted tightness in horizontal abduction L shoulder but history of AC tear and surgical repair with  complications.  Pain with movement only in L shoulder.    UPPER EXTREMITY MMT:  MMT Right eval Left eval  Shoulder flexion 5 5  Shoulder extension 5 5  Shoulder abduction 5 5  Shoulder adduction 5 5  Shoulder extension 5 5  Shoulder internal rotation 5 5  Shoulder external rotation 5 5  Elbow flexion 5 5  Elbow extension 5 5  Wrist flexion 5 5  Wrist extension 5 5  Wrist ulnar deviation 5 5  Wrist radial deviation 5 5  Wrist pronation 5 5  Wrist supination 5 5  Grip strength lbs 70 (65, 75, 70) 80    (Blank rows = not tested)  CERVICAL SPECIAL TESTS:  Spurling's test: Negative   FUNCTIONAL TESTS:  NA   TODAY'S TREATMENT:  08/13/2022 Therapeutic Exercise: to improve strength and mobility.  Demo, verbal and tactile cues throughout for technique.  UBE L2 61f/3b Wall angels x 15 Rows 25# 2 x 10  Lat pulls 25# 2 x 10  Chest press 15# 2 x 10  Thoracic mobilization over foam roller x 10 Nerve glides/stretches for carpal tunnel.  Manual Therapy: to decrease muscle spasm and pain and improve mobility Gentle cervical traction, PA mobs  cervical spine, STM cervical paraspinals, NAGs into rotation.   08/11/2022 Therapeutic Exercise: to improve strength and mobility.   UBE L2 23f/3b Cervical retraction with resistance RTB Cervical s/b with resistance RTB Upper trap and levator stretches.   Manual Therapy: to decrease muscle spasm and improve mobility STM/TPR to bil cervical paraspinals, UT, levator, R pectoralis Trigger Point Dry-Needling  Treatment instructions: Expect mild to moderate muscle soreness. S/S of pneumothorax if dry needled over a lung field, and to seek immediate medical attention should they occur. Patient verbalized understanding of these instructions and education.  Patient Consent Given: Yes Education handout provided: Previously provided Muscles treated: bil UT, levator, cervical multifidi C5-7, R pectoralis Treatment response/outcome: Twitch Response  Elicited and Palpable Increase in Muscle Length   08/05/22 TherEx: UBE L1 24f/3b Median nerve glide x 10 3 way pec stretch x 30 doorway (low, mid, high) Seated SNAG ext and bil rotation x 10  Seated thoracic ext with foam roll arms behind head x 10 Seated horizontal ABD red TB 2x10  Manual Therapy: STM and TPR to R pec minor   08/04/2022 Therapeutic Exercise: to improve strength and mobility.  Demo, verbal and tactile cues throughout for technique. UBE L1 x 6 min (17f/3b) Thoracic mobs foam roller - crosswise Lengthwise foam roller - arm extensions x 10 bil, Y-arms x 10, angels x 10, chest stretch S/L open books x 10 At wall - open book stretch x 10, wall angels x 10  Manual Therapy: to decrease muscle spasm and pain and improve mobility STM/TPR to R UT, levator scapulae; skilled palpation and monitoring during dry needling. Trigger Point Dry-Needling  Treatment instructions: Expect mild to moderate muscle soreness. S/S of pneumothorax if dry needled over a lung field, and to seek immediate medical attention should they occur. Patient verbalized understanding of these instructions and education.  Patient Consent Given: Yes Education handout provided: Yes Muscles treated: R UT, R levator, R supraspinatus Treatment response/outcome: Palpable Increase in Muscle Length     PATIENT EDUCATION:  Education details: progressed HEP 08/11/22, carpal tunnel stretches and glides 08/13/22 Person educated: Patient Education method: Explanation, Demonstration, Verbal cues, and Handouts Education comprehension: verbalized understanding and returned demonstration   HOME EXERCISE PROGRAM: Access Code: [email or web address]  [email or web address]   ASSESSMENT:  CLINICAL IMPRESSION: Brian Obrien reports decreased pain in neck and shoulders but continues to have numbness in 1st 3 fingers.  Today was able to complete  all exercises without pain or difficulty, progressing to light resistance on machines.  He was also given exercises/nerve glides for carpal tunnel to see if improves symptoms in R hand.   Brian Obrien continues to demonstrate potential for improvement and would benefit from continued skilled therapy to address impairments.      OBJECTIVE IMPAIRMENTS decreased strength, hypomobility, increased fascial restrictions, increased muscle spasms, impaired sensation, postural dysfunction, and pain.   ACTIVITY LIMITATIONS lifting, sleeping, and caring for others  PARTICIPATION LIMITATIONS: cleaning, driving, shopping, community activity, and occupation  PERSONAL FACTORS 3+ comorbidities: history chronic neck and back pain, L shoulder injury, chronic knee pain, metabolic syndrome  are also affecting patient's functional outcome.   REHAB POTENTIAL: Good  CLINICAL DECISION MAKING: Stable/uncomplicated  EVALUATION COMPLEXITY: Low   GOALS: Goals reviewed with patient? Yes  SHORT TERM GOALS: Target date: 08/11/2022   Patient will be independent with initial HEP.  Baseline: given Goal status: IN PROGRESS  2.  Patient will report 50% improvement in RUE numbness/tingling  Baseline:  Goal status: IN PROGRESS  LONG TERM GOALS: Target date: 09/08/2022   Patient will be independent with advanced/ongoing HEP to improve outcomes and carryover.  Baseline: needs progression Goal status: IN PROGRESS  2.  Patient will report 75% improvement in neck pain to improve QOL.  Baseline:  Goal status: IN PROGRESS  3.  Patient will demonstrate full pain free cervical ROM for safety with driving.  Baseline: see objective Goal status: IN PROGRESS  4.  Patient will demonstrate improved posture to decrease muscle imbalance. Baseline: forward head, rounded shoulders, slouches Goal status: IN PROGRESS  5.  Patient will report 9/50 or less on NDI to demonstrate improved functional ability.  Baseline:  14/50 Goal status: IN PROGRESS  6.  Patient will demonstrate R grip strength >80 psi.    Baseline: R 70 psi, L hand in 80, right hand dominant Goal status: IN PROGRESS   7. Patient will report 50% improvement in headaches Baseline: moderate frequent headaches Goal status: IN PROGRESS    PLAN: PT FREQUENCY: 1-2x/week  PT DURATION: 6 weeks  PLANNED INTERVENTIONS: Therapeutic exercises, Therapeutic activity, Neuromuscular re-education, Balance training, Gait training, Patient/Family education, Self Care, Joint mobilization, Dry Needling, Electrical stimulation, Spinal mobilization, Cryotherapy, Moist heat, Traction, Ultrasound, Manual therapy, and Re-evaluation  PLAN FOR NEXT SESSION:  review and progress HEP, postural strengthening, manual therapy, modalities PRN.    [name], PT, DPT  08/13/2022, 11:32 AM

## 2022-08-18 ENCOUNTER — Ambulatory Visit: Payer: Managed Care, Other (non HMO) | Admitting: Physical Therapy

## 2022-08-18 ENCOUNTER — Encounter: Payer: Self-pay | Admitting: Physical Therapy

## 2022-08-18 DIAGNOSIS — M5412 Radiculopathy, cervical region: Secondary | ICD-10-CM

## 2022-08-18 DIAGNOSIS — M542 Cervicalgia: Secondary | ICD-10-CM | POA: Diagnosis not present

## 2022-08-18 DIAGNOSIS — R293 Abnormal posture: Secondary | ICD-10-CM

## 2022-08-18 DIAGNOSIS — M25511 Pain in right shoulder: Secondary | ICD-10-CM

## 2022-08-18 NOTE — Therapy (Signed)
OUTPATIENT PHYSICAL THERAPY TREATMENT   Patient Name: Brian Obrien MRN: [id number] DOB:1986/01/14, 36 y.o., male Today's Date: 08/18/2022   PT End of Session - 08/18/22 1441     Visit Number 6    Number of Visits 12    Date for PT Re-Evaluation 09/08/22    Authorization Type [employer] [id number]    PT Start Time 1445    PT Stop Time 1530    PT Time Calculation (min) 45 min    Activity Tolerance Patient tolerated treatment well    Behavior During Therapy [facility] for tasks assessed/performed              Past Medical History:  Diagnosis Date   Allergy    Anxiety    Chest pain    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   Chronic neck pain 11/12/2019   Chronic pain of left knee 11/12/2019   Dependence on nicotine from chewing tobacco 12/15/2017   Depression    Fatty liver    GERD (gastroesophageal reflux disease)    Hyperlipidemia    Left hip pain    Left sided sciatica 03/15/2018   Lytic bone lesions on xray 02/07/2018   Formatting of this note might be different from the original. Of right iliac crest.  Seen on x-ray in 1-19 and CT on 02/05/2018. Unchanged xray in 03/2018. Repeat in 6 months (CT vs Xray)   Metabolic syndrome 07/28/2228   Obesity (BMI 30-39.9) 12/15/2017   OSA (obstructive sleep apnea) 12/15/2017   2015; unable to tolerate CPAP   Palpitations    Severe obesity (BMI 35.0-39.9) with comorbidity ([place]) 11/12/2019   Sleep apnea    SOB (shortness of breath)    Vitamin B 12 deficiency    Vitamin D deficiency    Past Surgical History:  Procedure Laterality Date   HAIR TRANSPLANT     Patient Active Problem List   Diagnosis Date Noted   Capsulitis of right shoulder 06/30/2022   Carpal tunnel syndrome, right 06/30/2022   Tear of PCL (posterior cruciate ligament) of knee, left, subsequent encounter 07/08/2021   Rupture of anterior cruciate ligament of right knee 07/08/2021   Tear of LCL (lateral collateral ligament) of knee, right, initial  encounter 07/08/2021   Anxiety 07/08/2021   Low testosterone 04/02/2021   Polyphagia 03/24/2021   Seasonal allergies 11/27/2020   Other hyperlipidemia 11/04/2020   Depression 09/01/2020   Vitamin D deficiency 08/14/2020   Insulin resistance 07/07/2020   Pain of joint of left ankle and foot 05/09/2020   Joint pain 79/89/2119   Metabolic syndrome 41/74/0814   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   Cervical radiculopathy 11/12/2019   Chronic pain of left knee 11/12/2019   Class 2 severe obesity with serious comorbidity and body mass index (BMI) of 36.0 to 36.9 in adult [facility]) 11/12/2019   Left sided sciatica 03/15/2018   Lytic bone lesions on xray 02/07/2018   Dependence on nicotine from chewing tobacco 12/15/2017   Obesity (BMI 30-39.9) 12/15/2017   OSA (obstructive sleep apnea) 12/15/2017    PCP: Mackie Pai, PA-C  REFERRING PROVIDER: [name], MD  REFERRING DIAG: M77.8 (ICD-10-CM) - Capsulitis of right shoulder M54.12 (ICD-10-CM) - Cervical radiculopathy   THERAPY DIAG:  Cervicalgia  Radiculopathy, cervical region  Abnormal posture  Acute pain of right shoulder  Rationale for Evaluation and Treatment Rehabilitation  ONSET DATE: ~2-3 months ago  SUBJECTIVE:  SUBJECTIVE STATEMENT: Brian Obrien reports about 40% improvement in neck and and tingling in R hand.  Did try carpal tunnel exercises a couple of times.  Tips of R 1st 3 fingers still numb, but improving.  Neck feels tight today.     PERTINENT HISTORY:  06/30/22- R wrist injected for R Carpal tunnel PMH: L AC joint separation/surgical repair, R ACL reconstruction, L PCL tear; GERD, obesity  PAIN:  Are you having pain? Yes: NPRS scale: 2-3/10 Pain location: numbness in R  fingers, occasional jolt of pain.  Pain description: up 6-7 when sleeping on side Aggravating factors: moving, trying to sleep on side, getting hugs Relieving factors: ibuprofen, massage  PRECAUTIONS: None  WEIGHT BEARING RESTRICTIONS No  FALLS:  Has patient fallen in last 6 months? No  LIVING ENVIRONMENT: Lives with: lives with their family Lives in: House/apartment Stairs: Yes: Internal: 7 steps; on right going up, on left going up, and can reach both and External: 6 steps; on right going up, on left going up, and can reach both Has following equipment at home: None  OCCUPATION: MRI tech for [employer]  PLOF: Independent  PATIENT GOALS get rid of the neck pain and better shoulder mobility  OBJECTIVE:   DIAGNOSTIC FINDINGS:  08/07/2022 MR Cervical spine IMPRESSION: 1. Mild degenerative changes of the cervical spine greatest at C6-7 where there is mild right foraminal stenosis. There is also borderline-mild canal stenosis at this level. Findings have minimally progressed from prior. 2. 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  PATIENT SURVEYS:  NDI 14/50= 28% disability    COGNITION: Overall cognitive status: Within functional limits for tasks assessed   SENSATION: Numbness/tingling into 1st 3 fingers R hand  POSTURE: rounded shoulders, forward head, and increased thoracic kyphosis  PALPATION: Tenderness R shoulder levator scapulae, R AC joint, R infraspinatus, tightness throughout bil cervical paraspinals, hypomobility throughout cervical spine.     CERVICAL ROM:   Active ROM AROM (deg) eval 08/18/2022 AROM  Flexion 45* 45  Extension 40 60  Right lateral flexion 30* 60  Left lateral flexion 30 35  Right rotation 70 65  Left rotation 65 75   (Blank rows = not tested)* pain  UPPER EXTREMITY ROM:  full R shoulder ROM without pain.  Noted tightness in horizontal abduction  L shoulder but history of AC tear and surgical repair with complications.  Pain with movement only in L shoulder.    UPPER EXTREMITY MMT:  MMT Right eval Left eval  Shoulder flexion 5 5  Shoulder extension 5 5  Shoulder abduction 5 5  Shoulder adduction 5 5  Shoulder extension 5 5  Shoulder internal rotation 5 5  Shoulder external rotation 5 5  Elbow flexion 5 5  Elbow extension 5 5  Wrist flexion 5 5  Wrist extension 5 5  Wrist ulnar deviation 5 5  Wrist radial deviation 5 5  Wrist pronation 5 5  Wrist supination 5 5  Grip strength lbs 70 (65, 75, 70) 80    (Blank rows = not tested)  CERVICAL SPECIAL TESTS:  Spurling's test: Negative   FUNCTIONAL TESTS:  NA   TODAY'S TREATMENT:  08/18/2022 Therapeutic Exercise: to improve strength and mobility.  UBE L2 61f3b Therapeutic Activity:  to assess progress towards goals.  ROM, grip strength testing.  Manual Therapy: to decrease muscle spasm and pain and improve mobility STM/TPR to cervical paraspinals, UT, levator bil.  PA mobs thoracic  spine; skilled palpation and monitoring during dry needling. Trigger Point Dry-Needling  Treatment instructions: Expect mild to moderate muscle soreness. S/S of pneumothorax if dry needled over a lung field, and to seek immediate medical attention should they occur. Patient verbalized understanding of these instructions and education.  Patient Consent Given: Yes Education handout provided: Previously provided Muscles treated: bil UT, levator, cervical multifidi C4/5,C5/6.  R rhomboids, supraspinatus. Treatment response/outcome: Twitch Response Elicited and Palpable Increase in Muscle Length  08/13/2022 Therapeutic Exercise: to improve strength and mobility.  Demo, verbal and tactile cues throughout for technique.  UBE L2 9f3b Wall angels x 15 Rows 25# 2 x 10  Lat pulls 25# 2 x 10  Chest press 15# 2 x 10  Thoracic mobilization over foam roller x 10 Nerve glides/stretches for carpal  tunnel.  Manual Therapy: to decrease muscle spasm and pain and improve mobility Gentle cervical traction, PA mobs cervical spine, STM cervical paraspinals, NAGs into rotation.   08/11/2022 Therapeutic Exercise: to improve strength and mobility.   UBE L2 367fb Cervical retraction with resistance RTB Cervical s/b with resistance RTB Upper trap and levator stretches.   Manual Therapy: to decrease muscle spasm and improve mobility STM/TPR to bil cervical paraspinals, UT, levator, R pectoralis Trigger Point Dry-Needling  Treatment instructions: Expect mild to moderate muscle soreness. S/S of pneumothorax if dry needled over a lung field, and to seek immediate medical attention should they occur. Patient verbalized understanding of these instructions and education.  Patient Consent Given: Yes Education handout provided: Previously provided Muscles treated: bil UT, levator, cervical multifidi C5-7, R pectoralis Treatment response/outcome: Twitch Response Elicited and Palpable Increase in Muscle Length   08/05/22 TherEx: UBE L1 74f4f Median nerve glide x 10 3 way pec stretch x 30 doorway (low, mid, high) Seated SNAG ext and bil rotation x 10  Seated thoracic ext with foam roll arms behind head x 10 Seated horizontal ABD red TB 2x10  Manual Therapy: STM and TPR to R pec minor   08/04/2022 Therapeutic Exercise: to improve strength and mobility.  Demo, verbal and tactile cues throughout for technique. UBE L1 x 6 min (74f/87f Thoracic mobs foam roller - crosswise Lengthwise foam roller - arm extensions x 10 bil, Y-arms x 10, angels x 10, chest stretch S/L open books x 10 At wall - open book stretch x 10, wall angels x 10  Manual Therapy: to decrease muscle spasm and pain and improve mobility STM/TPR to R UT, levator scapulae; skilled palpation and monitoring during dry needling. Trigger Point Dry-Needling  Treatment instructions: Expect mild to moderate muscle soreness. S/S of pneumothorax  if dry needled over a lung field, and to seek immediate medical attention should they occur. Patient verbalized understanding of these instructions and education.  Patient Consent Given: Yes Education handout provided: Yes Muscles treated: R UT, R levator, R supraspinatus Treatment response/outcome: Palpable Increase in Muscle Length     PATIENT EDUCATION:  Education details: progressed HEP 08/11/22, carpal tunnel stretches and glides 08/13/22 Person educated: Patient Education method: Explanation, Demonstration, Verbal cues, and Handouts Education comprehension: verbalized understanding and returned demonstration   HOME EXERCISE PROGRAM: Access Code: [email or web address]   ASSESSMENT:  CLINICAL IMPRESSION: Brian Marczakmaking good progress towards goals, reporting 40% improvement in symptoms, decreased numbness, and also demonstrates improved cervical ROM without pain, just tightness now.  Grip strength R hand has not improved.  He has met STG #1 and LTG #3. Focus of session today was manual therapy and TrDN  to decrease tightness in both shoulders and neck, he reported immediate improvement in neck ROM following.   Brian Obrien continues to demonstrate potential for improvement and would benefit from continued skilled therapy to address impairments.      OBJECTIVE IMPAIRMENTS decreased strength, hypomobility, increased fascial restrictions, increased muscle spasms, impaired sensation, postural dysfunction, and pain.   ACTIVITY LIMITATIONS lifting, sleeping, and caring for others  PARTICIPATION LIMITATIONS: cleaning, driving, shopping, community activity, and occupation  PERSONAL FACTORS 3+ comorbidities: history chronic neck and back pain, L shoulder injury, chronic knee pain, metabolic syndrome  are also affecting patient's functional outcome.   REHAB POTENTIAL:  Good  CLINICAL DECISION MAKING: Stable/uncomplicated  EVALUATION COMPLEXITY: Low   GOALS: Goals reviewed with patient? Yes  SHORT TERM GOALS: Target date: 08/11/2022   Patient will be independent with initial HEP.  Baseline: given Goal status: MET  2.  Patient will report 50% improvement in RUE numbness/tingling  Baseline:  Goal status: IN PROGRESS 08/18/22- 30-40% improvement   LONG TERM GOALS: Target date: 09/08/2022   Patient will be independent with advanced/ongoing HEP to improve outcomes and carryover.  Baseline: needs progression Goal status: IN PROGRESS  2.  Patient will report 75% improvement in neck pain to improve QOL.  Baseline:  Goal status: IN PROGRESS 08/18/22- 40-50% improvement.     3.  Patient will demonstrate full pain free cervical ROM for safety with driving.  Baseline: see objective Goal status: MET  08/18/22- no pain just tightness  4.  Patient will demonstrate improved posture to decrease muscle imbalance. Baseline: forward head, rounded shoulders, slouches Goal status: IN PROGRESS  5.  Patient will report 9/50 or less on NDI to demonstrate improved functional ability.  Baseline: 14/50 Goal status: IN PROGRESS  6.  Patient will demonstrate R grip strength >80 psi.    Baseline: R 70 psi, L hand in 80, right hand dominant Goal status: IN PROGRESS 08/18/22- 70 psi R  7. Patient will report 50% improvement in headaches Baseline: moderate frequent headaches Goal status: IN PROGRESS  08/18/22- improving, no headache x 3 days    PLAN: PT FREQUENCY: 1-2x/week  PT DURATION: 6 weeks  PLANNED INTERVENTIONS: Therapeutic exercises, Therapeutic activity, Neuromuscular re-education, Balance training, Gait training, Patient/Family education, Self Care, Joint mobilization, Dry Needling, Electrical stimulation, Spinal mobilization, Cryotherapy, Moist heat, Traction, Ultrasound, Manual therapy, and Re-evaluation  PLAN FOR NEXT SESSION:  review and progress  HEP, postural strengthening, manual therapy, modalities PRN.    [name], PT, DPT  08/18/2022, 4:29 PM

## 2022-08-19 ENCOUNTER — Ambulatory Visit: Payer: Managed Care, Other (non HMO) | Admitting: Physician Assistant

## 2022-08-19 ENCOUNTER — Encounter: Payer: Self-pay | Admitting: Physician Assistant

## 2022-08-19 VITALS — BP 122/78 | HR 79 | Ht 70.0 in | Wt 247.2 lb

## 2022-08-19 DIAGNOSIS — R070 Pain in throat: Secondary | ICD-10-CM | POA: Diagnosis not present

## 2022-08-19 DIAGNOSIS — K219 Gastro-esophageal reflux disease without esophagitis: Secondary | ICD-10-CM | POA: Diagnosis not present

## 2022-08-19 MED ORDER — ESOMEPRAZOLE MAGNESIUM 40 MG PO CPDR: 40.0000 mg | DELAYED_RELEASE_CAPSULE | Freq: Every day | ORAL | 2 refills | Status: DC

## 2022-08-19 NOTE — Progress Notes (Signed)
Chief Complaint: GERD and dysphagia  HPI:    [name] is a 36 year old male with a past medical history as listed below including anxiety, GERD, fatty liver, OSA and multiple others listed below, who was referred to me by Esperanza Richters, PA-C for a complaint of dysphagia and GERD.      03/26/2020 echo with normal LVEF.    06/02/2022 office visit with PCP for wellness exam described that he had some pain with swallowing at times as well as intermittent reflux.  He was referred to our clinic.  Labs at that visit including CMP, CBC and TSH normal.    08/07/2022 MRI of the cervical spine without contrast showed mild degenerative changes of the cervical spine greatest at C6-7 where there is mild right foraminal stenosis and borderline mild canal stenosis.    Today, the patient presents to clinic and tells me that he has had symptoms of heartburn and reflux intermittently over the past 10 years. Initially, he was on Nexium at the time which really seemed to help his symptoms.  He then stopped smoking and symptoms went away.  Tells me he started vaping a few months ago and has noticed an increase in heartburn and reflux over the past 6 to 7 months.  Tells me that sometimes it will even wake him up at night and he will take Pepcid Complete which seems to work well.  Also describes that 1-2 times a month he will have some pain when swallowing more on the left side of his throat and one time this even made him hoarse so that he could not talk.  This has been going on for 6 to 7 months as well.  Sometimes it will last for a week and is pretty constant and seems to go away by itself.  Does enjoy very spicy food.    Describes previous EGD and colonoscopy in 2016 in [place].  Tells me he had internal hemorrhoids and a benign polyp.    Denies fever, chills, weight loss, blood in his stool or symptoms that awaken him from sleep.  Past Medical History:  Diagnosis Date   Allergy    Anxiety    Chest pain     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   Chronic neck pain 11/12/2019   Chronic pain of left knee 11/12/2019   Dependence on nicotine from chewing tobacco 12/15/2017   Depression    Fatty liver    GERD (gastroesophageal reflux disease)    Hyperlipidemia    Left hip pain    Left sided sciatica 03/15/2018   Lytic bone lesions on xray 02/07/2018   Formatting of this note might be different from the original. Of right iliac crest.  Seen on x-ray in 1-19 and CT on 02/05/2018. Unchanged xray in 03/2018. Repeat in 6 months (CT vs Xray)   Metabolic syndrome 12/25/2019   Obesity (BMI 30-39.9) 12/15/2017   OSA (obstructive sleep apnea) 12/15/2017   2015; unable to tolerate CPAP   Palpitations    Severe obesity (BMI 35.0-39.9) with comorbidity ([facility]) 11/12/2019   Sleep apnea    SOB (shortness of breath)    Vitamin B 12 deficiency    Vitamin D deficiency     Past Surgical History:  Procedure Laterality Date   HAIR TRANSPLANT      Current Outpatient Medications  Medication Sig Dispense Refill   acetaminophen (TYLENOL) 500 MG tablet Take 1 tablet (500 mg total) by mouth every 6 (six)  hours as needed for mild pain, moderate pain, fever or headache. 30 tablet 0   albuterol (VENTOLIN HFA) 108 (90 Base) MCG/ACT inhaler Inhale 2 puffs into the lungs every 6 (six) hours as needed. (Patient not taking: Reported on 07/28/2022) 18 g 0   azelastine (ASTELIN) 0.1 % nasal spray PLACE 2 SPRAYS INTO BOTH NOSTRILS 2 (TWO) TIMES DAILY AS DIRECTED 30 mL 2   Biotin 36644 MCG TABS Take by mouth.     HYDROcodone-acetaminophen (NORCO/VICODIN) 5-325 MG tablet Take 1 tablet by mouth every four to six hours as needed for pain (Patient not taking: Reported on 07/28/2022) 30 tablet 0   lidocaine (XYLOCAINE) 2 % solution 24mL swish and gargle in mouth for 15-20 seconds then swallow, may use every 4 hours as needed 200 mL 0   loratadine (CLARITIN) 10 MG tablet Take 10 mg by mouth daily.     meclizine  (ANTIVERT) 25 MG tablet Take 1 tablet (25 mg total) by mouth 3 (three) times daily as needed for dizziness. 30 tablet 0   Multiple Vitamin (MULTIVITAMIN) tablet Take 1 tablet by mouth every other day.     ondansetron (ZOFRAN) 4 MG tablet Take 1 tablet by mouth every eight hours as needed for nausea and vomiting (Patient not taking: Reported on 07/28/2022) 10 tablet 0   Current Facility-Administered Medications  Medication Dose Route Frequency Provider Last Rate Last Admin   methylPREDNISolone acetate (DEPO-MEDROL) injection 40 mg  40 mg Intramuscular Once Myra Rude, MD        Allergies as of 08/19/2022   (No Known Allergies)    Family History  Problem Relation Age of Onset   COPD Father    Heart disease Father    Liver disease Father    Depression Father    Diabetes Mother    High blood pressure Mother    High Cholesterol Mother    Kidney disease Mother    Thyroid disease Mother    Sleep apnea Mother    Obesity Mother    Other Brother        low testosterone    Social History   Socioeconomic History   Marital status: Single    Spouse name: Not on file   Number of children: Not on file   Years of education: Not on file   Highest education level: Not on file  Occupational History   Occupation: mri tech  Tobacco Use   Smoking status: Former    Packs/day: 0.25    Types: Cigarettes    Quit date: 03/06/2012    Years since quitting: 10.4   Smokeless tobacco: Former    Types: Chew    Quit date: 03/17/2016  Vaping Use   Vaping Use: Never used  Substance and Sexual Activity   Alcohol use: Not Currently   Drug use: Never   Sexual activity: Not on file  Other Topics Concern   Not on file  Social History Narrative   Not on file   Social Determinants of Health   Financial Resource Strain: Not on file  Food Insecurity: Not on file  Transportation Needs: Not on file  Physical Activity: Not on file  Stress: Not on file  Social Connections: Not on file  Intimate  Partner Violence: Not on file    Review of Systems:    Constitutional: No weight loss, fever or chills Skin: No rash  Cardiovascular: No chest pain Respiratory: No SOB  Gastrointestinal: See HPI and otherwise negative Genitourinary: No dysuria Neurological:  No headache, dizziness or syncope Musculoskeletal: No new muscle or joint pain Hematologic: No bleeding  Psychiatric: No history of depression or anxiety   Physical Exam:  Vital signs: BP 122/78   Pulse 79   Ht 5\' 10"  (1.778 m)   Wt 247 lb 4 oz (112.2 kg)   SpO2 98%   BMI 35.48 kg/m    Constitutional:   Pleasant overweight Caucasian male appears to be in NAD, Well developed, Well nourished, alert and cooperative Head:  Normocephalic and atraumatic. Eyes:   PEERL, EOMI. No icterus. Conjunctiva pink. Ears:  Normal auditory acuity. Neck:  Supple Throat: Oral cavity and pharynx without inflammation, swelling or lesion.  Respiratory: Respirations even and unlabored. Lungs clear to auscultation bilaterally.   No wheezes, crackles, or rhonchi.  Cardiovascular: Normal S1, S2. No MRG. Regular rate and rhythm. No peripheral edema, cyanosis or pallor.  Gastrointestinal:  Soft, nondistended, nontender. No rebound or guarding. Normal bowel sounds. No appreciable masses or hepatomegaly. Rectal:  Not performed.  Msk:  Symmetrical without gross deformities. Without edema, no deformity or joint abnormality.  Neurologic:  Alert and  oriented x4;  grossly normal neurologically.  Skin:   Dry and intact without significant lesions or rashes. Psychiatric: Demonstrates good judgement and reason without abnormal affect or behaviors.  RELEVANT LABS AND IMAGING: CBC    Component Value Date/Time   WBC 5.5 06/02/2022 0912   RBC 5.16 06/02/2022 0912   HGB 14.2 06/02/2022 0912   HCT 42.5 06/02/2022 0912   PLT 294.0 06/02/2022 0912   MCV 82.3 06/02/2022 0912   MCH 28.0 06/05/2021 2100   MCHC 33.4 06/02/2022 0912   RDW 12.6 06/02/2022 0912    LYMPHSABS 1.7 06/02/2022 0912   MONOABS 0.6 06/02/2022 0912   EOSABS 0.2 06/02/2022 0912   BASOSABS 0.0 06/02/2022 0912    CMP     Component Value Date/Time   NA 138 06/02/2022 0912   NA 142 03/19/2020 1608   K 4.0 06/02/2022 0912   CL 101 06/02/2022 0912   CO2 31 06/02/2022 0912   GLUCOSE 82 06/02/2022 0912   BUN 11 06/02/2022 0912   BUN 14 03/19/2020 1608   CREATININE 0.84 06/02/2022 0912   CALCIUM 9.8 06/02/2022 0912   PROT 7.3 06/02/2022 0912   ALBUMIN 4.6 06/02/2022 0912   AST 21 06/02/2022 0912   ALT 27 06/02/2022 0912   ALKPHOS 64 06/02/2022 0912   BILITOT 0.5 06/02/2022 0912   GFRNONAA >60 06/05/2021 2100   GFRAA 132 03/19/2020 1608    Assessment: 1.  GERD: With some throat pain and hoarseness at times and nocturnal symptoms, does enjoy spicy food and vapes; consider most likely gastritis 2.  Throat pain: Likely with above  Plan: 1.  Started the patient on Nexium 40 mg every morning, 30-60 minutes before breakfast.  #30 with 3 refills. 2.  Reviewed antireflux diet and lifestyle modifications. 3.  We will try to get patient's records from Dr. Truman Hayward in [facility] with [facility], endoscopy and colonoscopy in 2016. 4.  Patient to follow in clinic in 2 to 3 months.  At that time can discuss titrating off of the medication altogether especially since he is going to stop vaping versus possibly continuing something indefinitely possibly Pepcid if able.  He will call if no benefit from the medicine and could consider repeat EGD.  He was assigned to Dr. Silverio Decamp this morning.  Ellouise Newer, PA-C [facility] 08/19/2022, 10:31 AM  Cc: Mackie Pai, PA-C

## 2022-08-19 NOTE — Patient Instructions (Addendum)
We have sent the following medications to your pharmacy for you to pick up at your convenience: Nexium 40 mg daily 30-60 minutes before breakfast.   _______________________________________________________  If you are age 34 or older, your body mass index should be between 23-30. Your Body mass index is 35.48 kg/m. If this is out of the aforementioned range listed, please consider follow up with your Primary Care Provider.  If you are age 82 or younger, your body mass index should be between 19-25. Your Body mass index is 35.48 kg/m. If this is out of the aformentioned range listed, please consider follow up with your Primary Care Provider.   ________________________________________________________  [facility] providers would like to encourage you to use [facility] to communicate with providers for non-urgent requests or questions.  Due to long hold times on the telephone, sending your provider a message by [facility] may be a faster and more efficient way to get a response.  Please allow 48 business hours for a response.  Please remember that this is for non-urgent requests.  _______________________________________________________

## 2022-08-23 ENCOUNTER — Encounter: Payer: Managed Care, Other (non HMO) | Admitting: Physical Therapy

## 2022-08-25 ENCOUNTER — Ambulatory Visit: Payer: Managed Care, Other (non HMO) | Admitting: Physical Therapy

## 2022-08-26 ENCOUNTER — Ambulatory Visit: Payer: Managed Care, Other (non HMO) | Attending: Family Medicine

## 2022-08-26 DIAGNOSIS — M542 Cervicalgia: Secondary | ICD-10-CM | POA: Diagnosis present

## 2022-08-26 DIAGNOSIS — M6281 Muscle weakness (generalized): Secondary | ICD-10-CM | POA: Insufficient documentation

## 2022-08-26 DIAGNOSIS — R600 Localized edema: Secondary | ICD-10-CM | POA: Insufficient documentation

## 2022-08-26 DIAGNOSIS — R293 Abnormal posture: Secondary | ICD-10-CM | POA: Insufficient documentation

## 2022-08-26 DIAGNOSIS — M25512 Pain in left shoulder: Secondary | ICD-10-CM | POA: Diagnosis present

## 2022-08-26 DIAGNOSIS — M5412 Radiculopathy, cervical region: Secondary | ICD-10-CM | POA: Insufficient documentation

## 2022-08-26 DIAGNOSIS — M25511 Pain in right shoulder: Secondary | ICD-10-CM | POA: Diagnosis present

## 2022-08-26 NOTE — Therapy (Signed)
OUTPATIENT PHYSICAL THERAPY TREATMENT   Patient Name: Brian Obrien MRN: [id number] DOB:25-Jun-1986, 36 y.o., male Today's Date: 08/26/2022   PT End of Session - 08/26/22 1534     Visit Number 7    Number of Visits 12    Date for PT Re-Evaluation 09/08/22    Authorization Type [employer] [id number]    PT Start Time 1447    PT Stop Time 1529    PT Time Calculation (min) 42 min    Activity Tolerance Patient tolerated treatment well    Behavior During Therapy WFL for tasks assessed/performed               Past Medical History:  Diagnosis Date   Allergy    Anxiety    Chest pain    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   Chronic neck pain 11/12/2019   Chronic pain of left knee 11/12/2019   Dependence on nicotine from chewing tobacco 12/15/2017   Depression    Fatty liver    GERD (gastroesophageal reflux disease)    Hyperlipidemia    Left hip pain    Left sided sciatica 03/15/2018   Lytic bone lesions on xray 02/07/2018   Formatting of this note might be different from the original. Of right iliac crest.  Seen on x-ray in 1-19 and CT on 02/05/2018. Unchanged xray in 03/2018. Repeat in 6 months (CT vs Xray)   Metabolic syndrome 03/29/3219   Obesity (BMI 30-39.9) 12/15/2017   OSA (obstructive sleep apnea) 12/15/2017   2015; unable to tolerate CPAP   Palpitations    Severe obesity (BMI 35.0-39.9) with comorbidity ([place]) 11/12/2019   Sleep apnea    SOB (shortness of breath)    Vitamin B 12 deficiency    Vitamin D deficiency    Past Surgical History:  Procedure Laterality Date   HAIR TRANSPLANT     KNEE SURGERY Right 07/2021   left shoulder repair Left 05/2021   Patient Active Problem List   Diagnosis Date Noted   Capsulitis of right shoulder 06/30/2022   Carpal tunnel syndrome, right 06/30/2022   Tear of PCL (posterior cruciate ligament) of knee, left, subsequent encounter 07/08/2021   Rupture of anterior cruciate ligament of right knee 07/08/2021    Tear of LCL (lateral collateral ligament) of knee, right, initial encounter 07/08/2021   Anxiety 07/08/2021   Low testosterone 04/02/2021   Polyphagia 03/24/2021   Seasonal allergies 11/27/2020   Other hyperlipidemia 11/04/2020   Depression 09/01/2020   Vitamin D deficiency 08/14/2020   Insulin resistance 07/07/2020   Pain of joint of left ankle and foot 05/09/2020   Joint pain 25/42/7062   Metabolic syndrome 37/62/8315   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   Cervical radiculopathy 11/12/2019   Chronic pain of left knee 11/12/2019   Class 2 severe obesity with serious comorbidity and body mass index (BMI) of 36.0 to 36.9 in adult [facility]) 11/12/2019   Left sided sciatica 03/15/2018   Lytic bone lesions on xray 02/07/2018   Dependence on nicotine from chewing tobacco 12/15/2017   Obesity (BMI 30-39.9) 12/15/2017   OSA (obstructive sleep apnea) 12/15/2017    PCP: Mackie Pai, PA-C  REFERRING PROVIDER: [name], MD  REFERRING DIAG: M77.8 (ICD-10-CM) - Capsulitis of right shoulder M54.12 (ICD-10-CM) - Cervical radiculopathy   THERAPY DIAG:  Cervicalgia  Radiculopathy, cervical region  Abnormal posture  Acute pain of right shoulder  Acute pain of left shoulder  Muscle weakness (generalized)  Localized edema  Rationale for Evaluation and Treatment Rehabilitation  ONSET DATE: ~2-3 months ago  SUBJECTIVE:                                                                                                                                                                                                         SUBJECTIVE STATEMENT: Doing good, intermittent numbness in fingers now neck feels a little stiff.   PERTINENT HISTORY:  06/30/22- R wrist injected for R Carpal tunnel PMH: L AC joint separation/surgical repair, R ACL reconstruction, L PCL tear; GERD, obesity  PAIN:  Are you having pain? No  PRECAUTIONS: None  WEIGHT BEARING  RESTRICTIONS No  FALLS:  Has patient fallen in last 6 months? No  LIVING ENVIRONMENT: Lives with: lives with their family Lives in: House/apartment Stairs: Yes: Internal: 7 steps; on right going up, on left going up, and can reach both and External: 6 steps; on right going up, on left going up, and can reach both Has following equipment at home: None  OCCUPATION: MRI tech for [employer]  PLOF: Independent  PATIENT GOALS get rid of the neck pain and better shoulder mobility  OBJECTIVE:   DIAGNOSTIC FINDINGS:  08/07/2022 MR Cervical spine IMPRESSION: 1. Mild degenerative changes of the cervical spine greatest at C6-7 where there is mild right foraminal stenosis. There is also borderline-mild canal stenosis at this level. Findings have minimally progressed from prior. 2. 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  PATIENT SURVEYS:  NDI 14/50= 28% disability    COGNITION: Overall cognitive status: Within functional limits for tasks assessed   SENSATION: Numbness/tingling into 1st 3 fingers R hand  POSTURE: rounded shoulders, forward head, and increased thoracic kyphosis  PALPATION: Tenderness R shoulder levator scapulae, R AC joint, R infraspinatus, tightness throughout bil cervical paraspinals, hypomobility throughout cervical spine.     CERVICAL ROM:   Active ROM AROM (deg) eval 08/18/2022 AROM  Flexion 45* 45  Extension 40 60  Right lateral flexion 30* 60  Left lateral flexion 30 35  Right rotation 70 65  Left rotation 65 75   (Blank rows = not tested)* pain  UPPER EXTREMITY ROM:  full R shoulder ROM without pain.  Noted tightness in horizontal abduction L shoulder but history of AC tear and surgical repair with complications.  Pain with movement only in L shoulder.    UPPER EXTREMITY MMT:  MMT Right eval Left eval  Shoulder flexion 5 5  Shoulder extension 5 5  Shoulder  abduction 5 5  Shoulder adduction 5 5  Shoulder extension 5 5  Shoulder internal rotation 5 5  Shoulder external rotation 5 5  Elbow flexion 5 5  Elbow extension 5 5  Wrist flexion 5 5  Wrist extension 5 5  Wrist ulnar deviation 5 5  Wrist radial deviation 5 5  Wrist pronation 5 5  Wrist supination 5 5  Grip strength lbs 70 (65, 75, 70) 80    (Blank rows = not tested)  CERVICAL SPECIAL TESTS:  Spurling's test: Negative   FUNCTIONAL TESTS:  NA   TODAY'S TREATMENT:  08/26/22 Therapeutic Exercise: UBE L2 65f3b Seated row high grip 2x10 25# Seated lat pull 25# 2x10 Wall angels x 12  Standing thoracolumbar extension x 10 POE w/ cervical ext x 10 Quadruped thread the needle x 6 bil Quadruped arm raises x 10 bil  Manual Therapy: STM to bil cervicothoracic paraspinals, UT, LS  08/18/2022 Therapeutic Exercise: to improve strength and mobility.  UBE L2 339fb Therapeutic Activity:  to assess progress towards goals.  ROM, grip strength testing.  Manual Therapy: to decrease muscle spasm and pain and improve mobility STM/TPR to cervical paraspinals, UT, levator bil.  PA mobs thoracic spine; skilled palpation and monitoring during dry needling. Trigger Point Dry-Needling  Treatment instructions: Expect mild to moderate muscle soreness. S/S of pneumothorax if dry needled over a lung field, and to seek immediate medical attention should they occur. Patient verbalized understanding of these instructions and education.  Patient Consent Given: Yes Education handout provided: Previously provided Muscles treated: bil UT, levator, cervical multifidi C4/5,C5/6.  R rhomboids, supraspinatus. Treatment response/outcome: Twitch Response Elicited and Palpable Increase in Muscle Length  08/13/2022 Therapeutic Exercise: to improve strength and mobility.  Demo, verbal and tactile cues throughout for technique.  UBE L2 92f46f Wall angels x 15 Rows 25# 2 x 10  Lat pulls 25# 2 x 10  Chest press 15#  2 x 10  Thoracic mobilization over foam roller x 10 Nerve glides/stretches for carpal tunnel.  Manual Therapy: to decrease muscle spasm and pain and improve mobility Gentle cervical traction, PA mobs cervical spine, STM cervical paraspinals, NAGs into rotation.   08/11/2022 Therapeutic Exercise: to improve strength and mobility.   UBE L2 92f/15fCervical retraction with resistance RTB Cervical s/b with resistance RTB Upper trap and levator stretches.   Manual Therapy: to decrease muscle spasm and improve mobility STM/TPR to bil cervical paraspinals, UT, levator, R pectoralis Trigger Point Dry-Needling  Treatment instructions: Expect mild to moderate muscle soreness. S/S of pneumothorax if dry needled over a lung field, and to seek immediate medical attention should they occur. Patient verbalized understanding of these instructions and education.  Patient Consent Given: Yes Education handout provided: Previously provided Muscles treated: bil UT, levator, cervical multifidi C5-7, R pectoralis Treatment response/outcome: Twitch Response Elicited and Palpable Increase in Muscle Length   08/05/22 TherEx: UBE L1 92f/344fedian nerve glide x 10 3 way pec stretch x 30 doorway (low, mid, high) Seated SNAG ext and bil rotation x 10  Seated thoracic ext with foam roll arms behind head x 10 Seated horizontal ABD red TB 2x10  Manual Therapy: STM and TPR to R pec minor   08/04/2022 Therapeutic Exercise: to improve strength and mobility.  Demo, verbal and tactile cues throughout for technique. UBE L1 x 6 min (92f/3b52fhoracic mobs foam roller - crosswise Lengthwise foam roller - arm extensions x 10 bil, Y-arms x 10, angels x 10, chest stretch S/L open  books x 10 At wall - open book stretch x 10, wall angels x 10  Manual Therapy: to decrease muscle spasm and pain and improve mobility STM/TPR to R UT, levator scapulae; skilled palpation and monitoring during dry needling. Trigger Point  Dry-Needling  Treatment instructions: Expect mild to moderate muscle soreness. S/S of pneumothorax if dry needled over a lung field, and to seek immediate medical attention should they occur. Patient verbalized understanding of these instructions and education.  Patient Consent Given: Yes Education handout provided: Yes Muscles treated: R UT, R levator, R supraspinatus Treatment response/outcome: Palpable Increase in Muscle Length     PATIENT EDUCATION:  Education details: progressed HEP 08/11/22, carpal tunnel stretches and glides 08/13/22 Person educated: Patient Education method: Explanation, Demonstration, Verbal cues, and Handouts Education comprehension: verbalized understanding and returned demonstration   HOME EXERCISE PROGRAM: Access Code: [email or web address]  [email or web address]   ASSESSMENT:  CLINICAL IMPRESSION: Progressed through postural strengthening exercises today with demonstrating good carryover of exercises. No pain was noted with any interventions. Good response to MT, he had more tenderness along the L levator. He would definitely benefit form more progression of postural strengtneing and faciliatation of posterior shoulder girdle.    OBJECTIVE IMPAIRMENTS decreased strength, hypomobility, increased fascial restrictions, increased muscle spasms, impaired sensation, postural dysfunction, and pain.   ACTIVITY LIMITATIONS lifting, sleeping, and caring for others  PARTICIPATION LIMITATIONS: cleaning, driving, shopping, community activity, and occupation  PERSONAL FACTORS 3+ comorbidities: history chronic neck and back pain, L shoulder injury, chronic knee pain, metabolic syndrome  are also affecting patient's functional outcome.   REHAB POTENTIAL: Good  CLINICAL DECISION MAKING: Stable/uncomplicated  EVALUATION COMPLEXITY: Low   GOALS: Goals reviewed with patient? Yes  SHORT  TERM GOALS: Target date: 08/11/2022   Patient will be independent with initial HEP.  Baseline: given Goal status: MET  2.  Patient will report 50% improvement in RUE numbness/tingling  Baseline:  Goal status: IN PROGRESS 08/18/22- 30-40% improvement    LONG TERM GOALS: Target date: 09/08/2022   Patient will be independent with advanced/ongoing HEP to improve outcomes and carryover.  Baseline: needs progression Goal status: IN PROGRESS  2.  Patient will report 75% improvement in neck pain to improve QOL.  Baseline:  Goal status: IN PROGRESS 08/18/22- 40-50% improvement.     3.  Patient will demonstrate full pain free cervical ROM for safety with driving.  Baseline: see objective Goal status: MET  08/18/22- no pain just tightness  4.  Patient will demonstrate improved posture to decrease muscle imbalance. Baseline: forward head, rounded shoulders, slouches Goal status: IN PROGRESS  5.  Patient will report 9/50 or less on NDI to demonstrate improved functional ability.  Baseline: 14/50 Goal status: IN PROGRESS  6.  Patient will demonstrate R grip strength >80 psi.    Baseline: R 70 psi, L hand in 80, right hand dominant Goal status: IN PROGRESS 08/18/22- 70 psi R  7. Patient will report 50% improvement in headaches Baseline: moderate frequent headaches Goal status: IN PROGRESS  08/18/22- improving, no headache x 3 days    PLAN: PT FREQUENCY: 1-2x/week  PT DURATION: 6 weeks  PLANNED INTERVENTIONS: Therapeutic exercises, Therapeutic activity, Neuromuscular re-education, Balance training, Gait training, Patient/Family education, Self Care, Joint mobilization, Dry Needling, Electrical stimulation, Spinal mobilization, Cryotherapy, Moist heat, Traction, Ultrasound, Manual therapy, and Re-evaluation  PLAN FOR NEXT SESSION:  review and progress HEP, postural strengthening, manual therapy, modalities PRN.    Artist Pais, PTA 08/26/2022, 3:35 PM

## 2022-08-30 ENCOUNTER — Encounter: Payer: Managed Care, Other (non HMO) | Admitting: Physical Therapy

## 2022-09-01 ENCOUNTER — Ambulatory Visit: Payer: Managed Care, Other (non HMO) | Admitting: Physical Therapy

## 2022-09-02 ENCOUNTER — Ambulatory Visit: Payer: Managed Care, Other (non HMO)

## 2022-09-08 ENCOUNTER — Encounter: Payer: Managed Care, Other (non HMO) | Admitting: Physical Therapy

## 2022-09-08 ENCOUNTER — Ambulatory Visit: Payer: Managed Care, Other (non HMO)

## 2022-09-08 DIAGNOSIS — M25512 Pain in left shoulder: Secondary | ICD-10-CM

## 2022-09-08 DIAGNOSIS — M6281 Muscle weakness (generalized): Secondary | ICD-10-CM

## 2022-09-08 DIAGNOSIS — R293 Abnormal posture: Secondary | ICD-10-CM

## 2022-09-08 DIAGNOSIS — M542 Cervicalgia: Secondary | ICD-10-CM | POA: Diagnosis not present

## 2022-09-08 DIAGNOSIS — M25511 Pain in right shoulder: Secondary | ICD-10-CM

## 2022-09-08 DIAGNOSIS — M5412 Radiculopathy, cervical region: Secondary | ICD-10-CM

## 2022-09-08 DIAGNOSIS — R600 Localized edema: Secondary | ICD-10-CM

## 2022-09-08 NOTE — Therapy (Addendum)
OUTPATIENT PHYSICAL THERAPY TREATMENT  PHYSICAL THERAPY DISCHARGE SUMMARY  Visits from Start of Care: 8  Current functional level related to goals / functional outcomes: Improved pain by 80%, improved cervical ROM, NDI 2% impairment, decreased headaches.  Met 6/7 goals.    Remaining deficits: Decreased R grip strength   Education / Equipment: HEP  Plan: Patient agrees to discharge.  Patient is being discharged due to meeting the stated rehab goals.  Brian Obrien was placed on 30 day hold on 09/08/22 and has not needed to return to skilled physical therapy.    Brian Obrien, PT, DPT 10:50 AM 10/12/2022   Patient Name: Brian Obrien MRN: [id number] DOB:1986-10-17, 36 y.o., male Today's Date: 09/08/2022   PT End of Session - 09/08/22 1049     Visit Number 8    Number of Visits 12    Date for PT Re-Evaluation 09/08/22    Authorization Type [employer] [id number]    PT Start Time 1019    PT Stop Time 1047    PT Time Calculation (min) 28 min    Activity Tolerance Patient tolerated treatment well    Behavior During Therapy WFL for tasks assessed/performed                Past Medical History:  Diagnosis Date   Allergy    Anxiety    Chest pain    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   Chronic neck pain 11/12/2019   Chronic pain of left knee 11/12/2019   Dependence on nicotine from chewing tobacco 12/15/2017   Depression    Fatty liver    GERD (gastroesophageal reflux disease)    Hyperlipidemia    Left hip pain    Left sided sciatica 03/15/2018   Lytic bone lesions on xray 02/07/2018   Formatting of this note might be different from the original. Of right iliac crest.  Seen on x-ray in 1-19 and CT on 02/05/2018. Unchanged xray in 03/2018. Repeat in 6 months (CT vs Xray)   Metabolic syndrome 01/27/3290   Obesity (BMI 30-39.9) 12/15/2017   OSA (obstructive sleep apnea) 12/15/2017   2015; unable to tolerate CPAP   Palpitations    Severe  obesity (BMI 35.0-39.9) with comorbidity ([place]) 11/12/2019   Sleep apnea    SOB (shortness of breath)    Vitamin B 12 deficiency    Vitamin D deficiency    Past Surgical History:  Procedure Laterality Date   HAIR TRANSPLANT     KNEE SURGERY Right 07/2021   left shoulder repair Left 05/2021   Patient Active Problem List   Diagnosis Date Noted   Capsulitis of right shoulder 06/30/2022   Carpal tunnel syndrome, right 06/30/2022   Tear of PCL (posterior cruciate ligament) of knee, left, subsequent encounter 07/08/2021   Rupture of anterior cruciate ligament of right knee 07/08/2021   Tear of LCL (lateral collateral ligament) of knee, right, initial encounter 07/08/2021   Anxiety 07/08/2021   Low testosterone 04/02/2021   Polyphagia 03/24/2021   Seasonal allergies 11/27/2020   Other hyperlipidemia 11/04/2020   Depression 09/01/2020   Vitamin D deficiency 08/14/2020   Insulin resistance 07/07/2020   Pain of joint of left ankle and foot 05/09/2020   Joint pain 91/66/0600   Metabolic syndrome 45/99/7741   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   Cervical radiculopathy 11/12/2019   Chronic pain of left knee 11/12/2019   Class 2 severe obesity with serious comorbidity and body mass index (  BMI) of 36.0 to 36.9 in adult [facility]) 11/12/2019   Left sided sciatica 03/15/2018   Lytic bone lesions on xray 02/07/2018   Dependence on nicotine from chewing tobacco 12/15/2017   Obesity (BMI 30-39.9) 12/15/2017   OSA (obstructive sleep apnea) 12/15/2017    PCP: Brian Pai, PA-C  REFERRING PROVIDER: [name], MD  REFERRING DIAG: M77.8 (ICD-10-CM) - Capsulitis of right shoulder M54.12 (ICD-10-CM) - Cervical radiculopathy   THERAPY DIAG:  Cervicalgia  Radiculopathy, cervical region  Abnormal posture  Acute pain of right shoulder  Acute pain of left shoulder  Muscle weakness (generalized)  Localized edema  Rationale for Evaluation and  Treatment Rehabilitation  ONSET DATE: ~2-3 months ago  SUBJECTIVE:                                                                                                                                                                                                         SUBJECTIVE STATEMENT: Pt reports that he has been doing good. No pain and numbness has completely subsided.    PERTINENT HISTORY:  06/30/22- R wrist injected for R Carpal tunnel PMH: L AC joint separation/surgical repair, R ACL reconstruction, L PCL tear; GERD, obesity  PAIN:  Are you having pain? No  PRECAUTIONS: None  WEIGHT BEARING RESTRICTIONS No  FALLS:  Has patient fallen in last 6 months? No  LIVING ENVIRONMENT: Lives with: lives with their family Lives in: House/apartment Stairs: Yes: Internal: 7 steps; on right going up, on left going up, and can reach both and External: 6 steps; on right going up, on left going up, and can reach both Has following equipment at home: None  OCCUPATION: MRI tech for [employer]  PLOF: Independent  PATIENT GOALS get rid of the neck pain and better shoulder mobility  OBJECTIVE:   DIAGNOSTIC FINDINGS:  08/07/2022 MR Cervical spine IMPRESSION: 1. Mild degenerative changes of the cervical spine greatest at C6-7 where there is mild right foraminal stenosis. There is also borderline-mild canal stenosis at this level. Findings have minimally progressed from prior. 2. 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  PATIENT SURVEYS:  NDI 14/50= 28% disability    COGNITION: Overall cognitive status: Within functional limits for tasks assessed   SENSATION: Numbness/tingling into 1st 3 fingers R hand  POSTURE: rounded shoulders, forward head, and increased thoracic kyphosis  PALPATION: Tenderness R shoulder levator scapulae, R AC joint, R infraspinatus, tightness throughout bil cervical  paraspinals, hypomobility throughout cervical spine.     CERVICAL ROM:  Active ROM AROM (deg) eval 08/18/2022 AROM  Flexion 45* 45  Extension 40 60  Right lateral flexion 30* 60  Left lateral flexion 30 35  Right rotation 70 65  Left rotation 65 75   (Blank rows = not tested)* pain  UPPER EXTREMITY ROM:  full R shoulder ROM without pain.  Noted tightness in horizontal abduction L shoulder but history of AC tear and surgical repair with complications.  Pain with movement only in L shoulder.    UPPER EXTREMITY MMT:  MMT Right eval Left eval  Shoulder flexion 5 5  Shoulder extension 5 5  Shoulder abduction 5 5  Shoulder adduction 5 5  Shoulder extension 5 5  Shoulder internal rotation 5 5  Shoulder external rotation 5 5  Elbow flexion 5 5  Elbow extension 5 5  Wrist flexion 5 5  Wrist extension 5 5  Wrist ulnar deviation 5 5  Wrist radial deviation 5 5  Wrist pronation 5 5  Wrist supination 5 5  Grip strength lbs 70 (65, 75, 70) 80    (Blank rows = not tested)  CERVICAL SPECIAL TESTS:  Spurling's test: Negative   FUNCTIONAL TESTS:  NA   TODAY'S TREATMENT:  09/08/22 TE: UBE L2 3 min fwd/ 3 min back Seated row 35# 2x10 Seated lat pulls 35# 2x10 Checked all LTGs to assess progress  08/26/22 Therapeutic Exercise: UBE L2 [id number] Seated row high grip 2x10 25# Seated lat pull 25# 2x10 Wall angels x 12  Standing thoracolumbar extension x 10 POE w/ cervical ext x 10 Quadruped thread the needle x 6 bil Quadruped arm raises x 10 bil  Manual Therapy: STM to bil cervicothoracic paraspinals, UT, LS  08/18/2022 Therapeutic Exercise: to improve strength and mobility.  UBE L2 372fb Therapeutic Activity:  to assess progress towards goals.  ROM, grip strength testing.  Manual Therapy: to decrease muscle spasm and pain and improve mobility STM/TPR to cervical paraspinals, UT, levator bil.  PA mobs thoracic spine; skilled palpation and monitoring during dry  needling. Trigger Point Dry-Needling  Treatment instructions: Expect mild to moderate muscle soreness. S/S of pneumothorax if dry needled over a lung field, and to seek immediate medical attention should they occur. Patient verbalized understanding of these instructions and education.  Patient Consent Given: Yes Education handout provided: Previously provided Muscles treated: bil UT, levator, cervical multifidi C4/5,C5/6.  R rhomboids, supraspinatus. Treatment response/outcome: Twitch Response Elicited and Palpable Increase in Muscle Length       PATIENT EDUCATION:  Education details: progressed HEP 08/11/22, carpal tunnel stretches and glides 08/13/22 Person educated: Patient Education method: Explanation, Demonstration, Verbal cues, and Handouts Education comprehension: verbalized understanding and returned demonstration   HOME EXERCISE PROGRAM: Access Code: [email or web address]   ASSESSMENT:  CLINICAL IMPRESSION: Brian Obrien improved very well with PT. He reports great improvement in neck pain along with R UE N/T which allows him to meet STG and LTG #2. He reports that headaches have subsided for the past 2 weeks. He demonstrates improved postural awareness. He scored 1/50 (2%) on NDI. The only goal unmet was for grip strength, otherwise all other goals have been met. Pt is pleased with progress, he has a gym regimen along with the his HEP and feels fine with going on 30 day hold at this time.    OBJECTIVE IMPAIRMENTS decreased strength, hypomobility, increased fascial restrictions, increased muscle spasms, impaired sensation, postural dysfunction, and pain.   ACTIVITY LIMITATIONS lifting, sleeping, and caring for others  PARTICIPATION  LIMITATIONS: cleaning, driving, shopping, community activity, and occupation  PERSONAL FACTORS 3+ comorbidities: history chronic neck and back  pain, L shoulder injury, chronic knee pain, metabolic syndrome  are also affecting patient's functional outcome.   REHAB POTENTIAL: Good  CLINICAL DECISION MAKING: Stable/uncomplicated  EVALUATION COMPLEXITY: Low   GOALS: Goals reviewed with patient? Yes  SHORT TERM GOALS: Target date: 08/11/2022   Patient will be independent with initial HEP.  Baseline: given Goal status: MET  2.  Patient will report 50% improvement in RUE numbness/tingling  Baseline:  Goal status: MET 09/08/22- 70% improvement   LONG TERM GOALS: Target date: 09/08/2022   Patient will be independent with advanced/ongoing HEP to improve outcomes and carryover.  Baseline: needs progression Goal status: MET  -09/08/22  2.  Patient will report 75% improvement in neck pain to improve QOL.  Baseline:  Goal status: MET 09/08/22- 80% improvement    3.  Patient will demonstrate full pain free cervical ROM for safety with driving.  Baseline: see objective Goal status: MET  08/18/22- no pain just tightness  4.  Patient will demonstrate improved posture to decrease muscle imbalance. Baseline: forward head, rounded shoulders, slouches Goal status: MET- 09/08/22   5.  Patient will report 9/50 or less on NDI to demonstrate improved functional ability.  Baseline: 14/50 Goal status: MET - 09/08/22 (1/50 2%)  6.  Patient will demonstrate R grip strength >80 psi.    Baseline: R 70 psi, L hand in 80, right hand dominant Goal status: NOT MET 08/18/22- 70 psi R  7. Patient will report 50% improvement in headaches Baseline: moderate frequent headaches Goal status: MET 09/08/22- no headaches in 2 weeks   PLAN: PT FREQUENCY: 1-2x/week  PT DURATION: 6 weeks  PLANNED INTERVENTIONS: Therapeutic exercises, Therapeutic activity, Neuromuscular re-education, Balance training, Gait training, Patient/Family education, Self Care, Joint mobilization, Dry Needling, Electrical stimulation, Spinal mobilization, Cryotherapy, Moist  heat, Traction, Ultrasound, Manual therapy, and Re-evaluation  PLAN FOR NEXT SESSION: 30 day hold  Loral Campi L Carlis Abbott, PTA 09/08/2022, 10:49 AM

## 2022-10-13 ENCOUNTER — Ambulatory Visit: Payer: Managed Care, Other (non HMO) | Admitting: Physician Assistant

## 2022-11-10 ENCOUNTER — Ambulatory Visit: Payer: Managed Care, Other (non HMO) | Admitting: Medical

## 2022-11-11 ENCOUNTER — Ambulatory Visit: Payer: Managed Care, Other (non HMO) | Admitting: Family Medicine

## 2022-11-12 ENCOUNTER — Ambulatory Visit (INDEPENDENT_AMBULATORY_CARE_PROVIDER_SITE_OTHER): Payer: Managed Care, Other (non HMO) | Admitting: Family Medicine

## 2022-11-12 ENCOUNTER — Encounter: Payer: Self-pay | Admitting: Family Medicine

## 2022-11-12 ENCOUNTER — Ambulatory Visit (HOSPITAL_BASED_OUTPATIENT_CLINIC_OR_DEPARTMENT_OTHER)
Admission: RE | Admit: 2022-11-12 | Discharge: 2022-11-12 | Disposition: A | Discharge status: Home / Self Care | Payer: Managed Care, Other (non HMO) | Source: Ambulatory Visit | Attending: Family Medicine | Admitting: Family Medicine

## 2022-11-12 VITALS — BP 136/92 | HR 78 | Temp 98.3°F | Resp 16 | Ht 70.0 in | Wt 254.0 lb

## 2022-11-12 DIAGNOSIS — E669 Obesity, unspecified: Secondary | ICD-10-CM

## 2022-11-12 DIAGNOSIS — M79671 Pain in right foot: Secondary | ICD-10-CM

## 2022-11-12 DIAGNOSIS — Z6836 Body mass index (BMI) 36.0-36.9, adult: Secondary | ICD-10-CM | POA: Diagnosis not present

## 2022-11-12 DIAGNOSIS — J302 Other seasonal allergic rhinitis: Secondary | ICD-10-CM | POA: Diagnosis not present

## 2022-11-12 MED ORDER — ALBUTEROL SULFATE HFA 108 (90 BASE) MCG/ACT IN AERS: 2.0000 | INHALATION_SPRAY | Freq: Four times a day (QID) | RESPIRATORY_TRACT | 0 refills | Status: DC | PRN

## 2022-11-12 NOTE — Patient Instructions (Addendum)
Foot pain: Xray today  Rest, ice, compression, elevation, home stretches Tylenol and ibuprofen as needed You can try metatarsal pads - [employer], etc  Referral to sports medicine if not improving   For your weight: I will update some of your labs, but then you will need to meet with your primary provider to discuss options Continue healthy lifestyle choices - heart healthy, low-carb diet and regular exercise

## 2022-11-12 NOTE — Progress Notes (Signed)
Acute Office Visit  Subjective:     Patient ID: Brian Obrien, male    DOB: Oct 28, 1986, 36 y.o.   MRN: [id number]  Chief Complaint  Patient presents with   Pain    Right foot and ankle   Weight Management Screening    HPI Patient is in today for right foot pain.  Patient reports he has been having right foot pain off and on for awhile now. States it improved temporarily when he lost some weight, but now that he has regained some weight, he has noticed the pain return. States the pain is 2/10 discomfort at the base of 2nd toe on right foot. He denies any swelling, bruising, deformities. He reports pain is worse with walking/weightbearing and improves with rest.  Additionally he would like to get some labs done so he can consider weight loss options with PCP. He is interested in [place].    ROS All review of systems negative except what is listed in the HPI      Objective:    BP (!) 136/92   Pulse 78   Temp 98.3 F (36.8 C)   Resp 16   Ht _0  (1.778 m)   Wt 254 lb (115.2 kg)   SpO2 96%   BMI 36.45 kg/m    Physical Exam Vitals reviewed.  Constitutional:      General: He is not in acute distress.    Appearance: Normal appearance. He is obese. He is not ill-appearing.  Musculoskeletal:        General: No swelling or tenderness. Normal range of motion.  Skin:    General: Skin is warm and dry.  Neurological:     General: No focal deficit present.     Mental Status: He is alert and oriented to person, place, and time. Mental status is at baseline.  Psychiatric:        Mood and Affect: Mood normal.        Behavior: Behavior normal.        Thought Content: Thought content normal.        Judgment: Judgment normal.     No results found for any visits on 11/12/22.      Assessment & Plan:   Problem List Items Addressed This Visit       Other   Seasonal allergies Refill provided    Relevant Medications   albuterol (VENTOLIN HFA) 108 (90 Base) MCG/ACT  inhaler   Other Visit Diagnoses     Right foot pain    -  Primary Xray today  Rest, ice, compression, elevation, home stretches Tylenol and ibuprofen as needed You can try metatarsal pads - [place], etc  Referral to sports medicine if not improving    Relevant Orders   DG Foot Complete Right    Class 2 obesity without serious comorbidity with body mass index (BMI) of 36.0 to 36.9 in adult, unspecified obesity type     I will update some of your labs, but then you will need to meet with your primary provider to discuss options Continue healthy lifestyle choices - heart healthy, low-carb diet and regular exercise    Relevant Orders   Comp Met (CMET)   HgB A1c   TSH       Meds ordered this encounter  Medications   albuterol (VENTOLIN HFA) 108 (90 Base) MCG/ACT inhaler    Sig: Inhale 2 puffs into the lungs every 6 (six) hours as needed.    Dispense:  18 g  Refill:  0    Order Specific Question:   Supervising Provider    Answer:   Mosie Lukes [[id number]]    Return in about 1 month (around 12/13/2022) for PCP weight management .  Terrilyn Saver, NP

## 2022-11-13 LAB — COMPREHENSIVE METABOLIC PANEL
AG Ratio: 1.8 (calc) (ref 1.0–2.5)
ALT: 27 U/L (ref 9–46)
AST: 21 U/L (ref 10–40)
Albumin: 4.6 g/dL (ref 3.6–5.1)
Alkaline phosphatase (APISO): 49 U/L (ref 36–130)
BUN: 12 mg/dL (ref 7–25)
CO2: 24 mmol/L (ref 20–32)
Calcium: 9.7 mg/dL (ref 8.6–10.3)
Chloride: 104 mmol/L (ref 98–110)
Creat: 0.71 mg/dL (ref 0.60–1.26)
Globulin: 2.6 g/dL (calc) (ref 1.9–3.7)
Glucose, Bld: 82 mg/dL (ref 65–99)
Potassium: 4.1 mmol/L (ref 3.5–5.3)
Sodium: 140 mmol/L (ref 135–146)
Total Bilirubin: 0.4 mg/dL (ref 0.2–1.2)
Total Protein: 7.2 g/dL (ref 6.1–8.1)

## 2022-11-13 LAB — TSH: TSH: 2.3 mIU/L (ref 0.40–4.50)

## 2022-11-13 LAB — HEMOGLOBIN A1C
Hgb A1c MFr Bld: 5.6 % of total Hgb (ref <5.7)
Mean Plasma Glucose: 114 mg/dL
eAG (mmol/L): 6.3 mmol/L

## 2022-11-17 ENCOUNTER — Other Ambulatory Visit: Payer: Self-pay | Admitting: Family Medicine

## 2022-11-17 DIAGNOSIS — M79671 Pain in right foot: Secondary | ICD-10-CM

## 2022-12-02 ENCOUNTER — Ambulatory Visit: Payer: Managed Care, Other (non HMO) | Admitting: Podiatry

## 2022-12-02 DIAGNOSIS — M778 Other enthesopathies, not elsewhere classified: Secondary | ICD-10-CM

## 2022-12-15 ENCOUNTER — Encounter: Payer: Self-pay | Admitting: Medical

## 2022-12-15 ENCOUNTER — Ambulatory Visit: Payer: Managed Care, Other (non HMO) | Admitting: Medical

## 2022-12-15 VITALS — BP 100/80 | HR 96 | Temp 98.2°F | Resp 18 | Ht 70.0 in | Wt 257.0 lb

## 2022-12-15 DIAGNOSIS — R5383 Other fatigue: Secondary | ICD-10-CM | POA: Diagnosis not present

## 2022-12-15 DIAGNOSIS — K219 Gastro-esophageal reflux disease without esophagitis: Secondary | ICD-10-CM

## 2022-12-15 DIAGNOSIS — E66812 Obesity, class 2: Secondary | ICD-10-CM

## 2022-12-15 DIAGNOSIS — E559 Vitamin D deficiency, unspecified: Secondary | ICD-10-CM | POA: Diagnosis not present

## 2022-12-15 DIAGNOSIS — Z6836 Body mass index (BMI) 36.0-36.9, adult: Secondary | ICD-10-CM

## 2022-12-15 DIAGNOSIS — R062 Wheezing: Secondary | ICD-10-CM

## 2022-12-15 DIAGNOSIS — M25572 Pain in left ankle and joints of left foot: Secondary | ICD-10-CM

## 2022-12-15 DIAGNOSIS — E669 Obesity, unspecified: Secondary | ICD-10-CM

## 2022-12-15 DIAGNOSIS — M79671 Pain in right foot: Secondary | ICD-10-CM

## 2022-12-15 DIAGNOSIS — J302 Other seasonal allergic rhinitis: Secondary | ICD-10-CM

## 2022-12-15 MED ORDER — ALBUTEROL SULFATE HFA 108 (90 BASE) MCG/ACT IN AERS: 2.0000 | INHALATION_SPRAY | Freq: Four times a day (QID) | RESPIRATORY_TRACT | 0 refills | Status: DC | PRN

## 2022-12-15 MED ORDER — METFORMIN HCL 500 MG PO TABS: 500.0000 mg | ORAL_TABLET | Freq: Every day | ORAL | 3 refills | Status: DC

## 2022-12-15 NOTE — Patient Instructions (Addendum)
Wheezing intermittent at times. Associated with allergy symptoms at times. Probable reactive airways. Pt does vape. Discussed may in long run effect lung. Maybe irritating airways presently.  - albuterol (VENTOLIN HFA) 108 (90 Base) MCG/ACT inhaler; Inhale 2 puffs into the lungs every 6 (six) hours as needed.  Dispense: 18 g; Refill: 0  2. Class 2 obesity without serious comorbidity with body mass index (BMI) of 36.0 to 36.9 in adult, unspecified obesity type Advise [employer] app and rx metformin.  3. Acute left ankle pain and Right foot pain.  Attend podiatrist appointment.  4. Gastroesophageal reflux disease without esophagitis  Continue nexium and healthier diet.  Follow up 3 months or sooner if needed.

## 2022-12-15 NOTE — Addendum Note (Signed)
Addended by: Anabel Halon on: 12/15/2022 03:00 PM   Modules accepted: Orders

## 2022-12-15 NOTE — Progress Notes (Signed)
Subjective:    Patient ID: Brian Obrien, male    DOB: Jul 08, 1986, 37 y.o.   MRN: [id number]  HPI   Pt in for some recent bilateral ankle pain. On left ankle pain is on lateral side. Pain on left ankle transient. Rt ankle/achilles tendon area and on bottom of rt foot metatarsal area. Pt saw other provider last month and referral placed. Appointment tomorrow.  Gerd- symptoms controlled with nexium.   States trying to eat healthy but struggling. Has gained some wight since summer. Weighted 243 lb.now he is up to 257 lb. He was going to healthy wt clinic. Pt stopped wegovy but gained weigt when stopped. He has used metformin and states that did decrease his appetitie.  Years of intermittent wheezing. Worse when he is heavier. Use albuterol once every other day in the past. But none recently as he ran out. Pt states pulmonologist told him not asthma. Pt does vape.     Review of Systems  Constitutional:  Negative for chills, fatigue and fever.  Respiratory:  Negative for chest tightness and wheezing.   Cardiovascular:  Negative for chest pain and palpitations.  Gastrointestinal:  Negative for abdominal pain and constipation.  Genitourinary:  Negative for dysuria, enuresis, flank pain and frequency.  Musculoskeletal:  Negative for back pain and joint swelling.  Neurological:  Negative for dizziness, light-headedness and numbness.  Hematological:  Negative for adenopathy. Does not bruise/bleed easily.  Psychiatric/Behavioral:  Negative for behavioral problems and confusion.     Past Medical History:  Diagnosis Date   Allergy    Anxiety    Chest pain    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   Chronic neck pain 11/12/2019   Chronic pain of left knee 11/12/2019   Dependence on nicotine from chewing tobacco 12/15/2017   Depression    Fatty liver    GERD (gastroesophageal reflux disease)    Hyperlipidemia    Left hip pain    Left sided sciatica  03/15/2018   Lytic bone lesions on xray 02/07/2018   Formatting of this note might be different from the original. Of right iliac crest.  Seen on x-ray in 1-19 and CT on 02/05/2018. Unchanged xray in 03/2018. Repeat in 6 months (CT vs Xray)   Metabolic syndrome 05/29/9380   Obesity (BMI 30-39.9) 12/15/2017   OSA (obstructive sleep apnea) 12/15/2017   2015; unable to tolerate CPAP   Palpitations    Severe obesity (BMI 35.0-39.9) with comorbidity ([place]) 11/12/2019   Sleep apnea    SOB (shortness of breath)    Vitamin B 12 deficiency    Vitamin D deficiency      Social History   Socioeconomic History   Marital status: Married    Spouse name: Not on file   Number of children: Not on file   Years of education: Not on file   Highest education level: Not on file  Occupational History   Occupation: mri tech  Tobacco Use   Smoking status: Former    Packs/day: 0.25    Types: Cigarettes    Quit date: 03/06/2012    Years since quitting: 10.7   Smokeless tobacco: Former    Types: Chew    Quit date: 03/17/2016  Vaping Use   Vaping Use: Former  Substance and Sexual Activity   Alcohol use: Not Currently   Drug use: Never   Sexual activity: Not on file  Other Topics Concern   Not on file  Social History Narrative  Not on file   Social Determinants of Health   Financial Resource Strain: Not on file  Food Insecurity: Not on file  Transportation Needs: Not on file  Physical Activity: Not on file  Stress: Not on file  Social Connections: Not on file  Intimate Partner Violence: Not on file    Past Surgical History:  Procedure Laterality Date   HAIR TRANSPLANT     KNEE SURGERY Right 07/2021   left shoulder repair Left 05/2021    Family History  Problem Relation Age of Onset   Diabetes Mother    High blood pressure Mother    High Cholesterol Mother    Kidney disease Mother    Thyroid disease Mother    Sleep apnea Mother    Obesity Mother    COPD Father    Heart disease Father     Liver disease Father    Depression Father    Other Brother        low testosterone   Colon cancer Neg Hx    Rectal cancer Neg Hx    Stomach cancer Neg Hx    Esophageal cancer Neg Hx     No Known Allergies  Current Outpatient Medications on File Prior to Visit  Medication Sig Dispense Refill   Biotin 10000 MCG TABS Take by mouth.     esomeprazole (NEXIUM) 40 MG capsule Take 1 capsule (40 mg total) by mouth daily before breakfast. 30 capsule 2   meclizine (ANTIVERT) 25 MG tablet Take 1 tablet (25 mg total) by mouth 3 (three) times daily as needed for dizziness. 30 tablet 0   Multiple Vitamin (MULTIVITAMIN) tablet Take 1 tablet by mouth every other day.     azelastine (ASTELIN) 0.1 % nasal spray PLACE 2 SPRAYS INTO BOTH NOSTRILS 2 (TWO) TIMES DAILY AS DIRECTED 30 mL 2   Current Facility-Administered Medications on File Prior to Visit  Medication Dose Route Frequency Provider Last Rate Last Admin   methylPREDNISolone acetate (DEPO-MEDROL) injection 40 mg  40 mg Intramuscular Once [name], MD        BP 100/80 (BP Location: Right Arm, Patient Position: Sitting, Cuff Size: Large)   Pulse 96   Temp 98.2 F (36.8 C) (Oral)   Resp 18   Ht 5\' 10"  (1.778 m)   Wt 257 lb (116.6 kg)   SpO2 95%   BMI 36.88 kg/m        Objective:   Physical Exam  General- No acute distress. Pleasant patient. Neck- Full range of motion, no jvd Lungs- Clear, even and unlabored. Heart- regular rate and rhythm. Neurologic- CNII- XII grossly intact.       Assessment & Plan:   Patient Instructions  Wheezing intermittent at times. Associated with allergy symptoms at times. Probable reactive airways. Pt does vape. Discussed may in long run effect lung. Maybe irritating airways presently.  - albuterol (VENTOLIN HFA) 108 (90 Base) MCG/ACT inhaler; Inhale 2 puffs into the lungs every 6 (six) hours as needed.  Dispense: 18 g; Refill: 0  2. Class 2 obesity without serious comorbidity with body  mass index (BMI) of 36.0 to 36.9 in adult, unspecified obesity type Advise [employer] app and rx metformin.  3. Acute left ankle pain and Right foot pain.  Attend podiatrist appointment.  4. Gastroesophageal reflux disease without esophagitis  Continue nexium and healthier diet.  Follow up 3 months or sooner if needed.   Mackie Pai PA-C

## 2022-12-16 ENCOUNTER — Ambulatory Visit: Payer: Managed Care, Other (non HMO) | Admitting: Podiatry

## 2022-12-16 ENCOUNTER — Encounter: Payer: Self-pay | Admitting: Podiatry

## 2022-12-16 ENCOUNTER — Ambulatory Visit: Payer: Managed Care, Other (non HMO)

## 2022-12-16 ENCOUNTER — Ambulatory Visit (INDEPENDENT_AMBULATORY_CARE_PROVIDER_SITE_OTHER): Payer: Managed Care, Other (non HMO)

## 2022-12-16 VITALS — BP 132/81 | HR 79

## 2022-12-16 DIAGNOSIS — M778 Other enthesopathies, not elsewhere classified: Secondary | ICD-10-CM | POA: Diagnosis not present

## 2022-12-16 DIAGNOSIS — M722 Plantar fascial fibromatosis: Secondary | ICD-10-CM

## 2022-12-16 DIAGNOSIS — M7752 Other enthesopathy of left foot: Secondary | ICD-10-CM | POA: Diagnosis not present

## 2022-12-16 LAB — CBC WITH DIFFERENTIAL/PLATELET
Basophils Absolute: 0 10*3/uL (ref 0.0–0.1)
Basophils Relative: 0.7 % (ref 0.0–3.0)
Eosinophils Absolute: 0.1 10*3/uL (ref 0.0–0.7)
Eosinophils Relative: 1.6 % (ref 0.0–5.0)
HCT: 41.6 % (ref 39.0–52.0)
Hemoglobin: 14.3 g/dL (ref 13.0–17.0)
Lymphocytes Relative: 31.4 % (ref 12.0–46.0)
Lymphs Abs: 2.1 10*3/uL (ref 0.7–4.0)
MCHC: 34.4 g/dL (ref 30.0–36.0)
MCV: 81.2 fl (ref 78.0–100.0)
Monocytes Absolute: 0.5 10*3/uL (ref 0.1–1.0)
Monocytes Relative: 8.2 % (ref 3.0–12.0)
Neutro Abs: 3.9 10*3/uL (ref 1.4–7.7)
Neutrophils Relative %: 58.1 % (ref 43.0–77.0)
Platelets: 352 10*3/uL (ref 150.0–400.0)
RBC: 5.13 Mil/uL (ref 4.22–5.81)
RDW: 12.7 % (ref 11.5–15.5)
WBC: 6.7 10*3/uL (ref 4.0–10.5)

## 2022-12-16 LAB — VITAMIN B12: Vitamin B-12: 287 pg/mL (ref 211–911)

## 2022-12-16 LAB — VITAMIN D 25 HYDROXY (VIT D DEFICIENCY, FRACTURES): VITD: 30.89 ng/mL (ref 30.00–100.00)

## 2022-12-16 MED ORDER — METHYLPREDNISOLONE 4 MG PO TBPK: ORAL_TABLET | ORAL | 0 refills | Status: DC

## 2022-12-16 MED ORDER — MELOXICAM 15 MG PO TABS
15.0000 mg | ORAL_TABLET | Freq: Every day | ORAL | 3 refills | Status: DC
Start: 2022-12-16 — End: 2024-01-18

## 2022-12-16 MED ORDER — TRIAMCINOLONE ACETONIDE 40 MG/ML IJ SUSP
40.0000 mg | Freq: Once | INTRAMUSCULAR | Status: AC
  Administered 2022-12-16: 40 mg
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
## 2022-12-20 LAB — VITAMIN B1: Vitamin B1 (Thiamine): 21 nmol/L (ref 8–30)

## 2023-01-13 ENCOUNTER — Ambulatory Visit: Payer: Managed Care, Other (non HMO) | Admitting: Podiatry

## 2023-01-27 ENCOUNTER — Ambulatory Visit: Payer: Managed Care, Other (non HMO) | Admitting: Podiatry

## 2023-01-27 ENCOUNTER — Encounter: Payer: Self-pay | Admitting: Podiatry

## 2023-01-27 DIAGNOSIS — M7752 Other enthesopathy of left foot: Secondary | ICD-10-CM

## 2023-01-27 DIAGNOSIS — M778 Other enthesopathies, not elsewhere classified: Secondary | ICD-10-CM | POA: Diagnosis not present

## 2023-01-27 MED ORDER — DEXAMETHASONE SODIUM PHOSPHATE 120 MG/30ML IJ SOLN
2.0000 mg | Freq: Once | INTRAMUSCULAR | Status: AC
  Administered 2023-01-27: 2 mg via INTRA_ARTICULAR

## 2023-01-27 MED ORDER — TRIAMCINOLONE ACETONIDE 40 MG/ML IJ SUSP
20.0000 mg | Freq: Once | INTRAMUSCULAR | Status: AC
  Administered 2023-01-27: 20 mg

## 2023-01-27 NOTE — Progress Notes (Signed)
He presents today for follow-up of his ankle left is capsulitis right states that heels are doing much better but still feels a little pain in the left ankle and the tops of the feet as he points to the lateral aspect of the tibialis anterior tendon at its insertion site as well as the lateral gutter of the left ankle and the dorsum of that foot along the TMT joints.  Objective: Vital signs are stable he is alert and oriented x 3 pulses are palpable.  He has pain on palpation of the left foot tarsometatarsal joints dorsally no palpable masses.  No fluctuance.  He does have tenderness on palpation of the lateral gutter of the ankle.  He does have tenderness on palpation of the tibialis anterior along the lateral border once it passes the ankle as it extends to the medial insertion site.  Assessment: Most likely tenosynovitis or tibialis anterior tendinitis right capsulitis dorsal aspect left foot.  And ankle capsulitis along the lateral border.  Plan: Discussed etiology pathology and surgical therapies at this point I recommended continued anti-inflammatories I injected dexamethasone into the ankle after sterile Betadine skin prep along the lateral border.  Also injected across the dorsal aspect of the left foot 10 mg Kenalog 5 mg Marcaine point of maximal tenderness.  Right foot I demonstrated along the lateral border of the tibialis anterior tendon following the tendon itself with 10 mg Kenalog 5 mg Marcaine.  Should this not alleviate his symptomatology then we will consider getting orthotics the next visit.  Follow-up with him in about 4 to 6 weeks

## 2023-02-15 ENCOUNTER — Telehealth: Payer: Managed Care, Other (non HMO) | Admitting: Nurse Practitioner

## 2023-02-15 ENCOUNTER — Other Ambulatory Visit: Payer: Self-pay | Admitting: Medical

## 2023-02-15 DIAGNOSIS — J40 Bronchitis, not specified as acute or chronic: Secondary | ICD-10-CM | POA: Diagnosis not present

## 2023-02-15 DIAGNOSIS — J302 Other seasonal allergic rhinitis: Secondary | ICD-10-CM

## 2023-02-15 MED ORDER — BENZONATATE 100 MG PO CAPS: 100.0000 mg | ORAL_CAPSULE | Freq: Three times a day (TID) | ORAL | 0 refills | Status: DC | PRN

## 2023-02-15 MED ORDER — AZITHROMYCIN 250 MG PO TABS: ORAL_TABLET | ORAL | 0 refills | Status: AC

## 2023-02-15 MED ORDER — PREDNISONE 20 MG PO TABS: 20.0000 mg | ORAL_TABLET | Freq: Two times a day (BID) | ORAL | 0 refills | Status: AC

## 2023-02-15 NOTE — Progress Notes (Signed)
Virtual Visit Consent   Brian Obrien, you are scheduled for a virtual visit with a [facility] provider today. Just as with appointments in the office, your consent must be obtained to participate. Your consent will be active for this visit and any virtual visit you may have with one of our providers in the next 365 days. If you have a MyChart account, a copy of this consent can be sent to you electronically.  As this is a virtual visit, video technology does not allow for your provider to perform a traditional examination. This may limit your provider's ability to fully assess your condition. If your provider identifies any concerns that need to be evaluated in person or the need to arrange testing (such as labs, EKG, etc.), we will make arrangements to do so. Although advances in technology are sophisticated, we cannot ensure that it will always work on either your end or our end. If the connection with a video visit is poor, the visit may have to be switched to a telephone visit. With either a video or telephone visit, we are not always able to ensure that we have a secure connection.  By engaging in this virtual visit, you consent to the provision of healthcare and authorize for your insurance to be billed (if applicable) for the services provided during this visit. Depending on your insurance coverage, you may receive a charge related to this service.  I need to obtain your verbal consent now. Are you willing to proceed with your visit today? [name] has provided verbal consent on 02/15/2023 for a virtual visit (video or telephone). Apolonio Schneiders, FNP  Date: 02/15/2023 1:43 PM  Virtual Visit via Video Note   I, Apolonio Schneiders, connected with  Brian Obrien  ([id number], 11-Jul-1986) on 02/15/23 at  1:45 PM EDT by a video-enabled telemedicine application and verified that I am speaking with the correct person using two identifiers.  Location: Patient: Virtual Visit Location Patient:  Home Provider: Virtual Visit Location Provider: Home Office   I discussed the limitations of evaluation and management by telemedicine and the availability of in person appointments. The patient expressed understanding and agreed to proceed.    History of Present Illness: Brian Obrien is a 37 y.o. who identifies as a male who was assigned male at birth, and is being seen today for cough and congestion  Symptom onset was 02/11/23 First was cough and scratchy throat  Feels he is progressively getting worse, feels the need to cough up mucous but is unable to fully produce what is in his chest.   Last night while coughing he started to feel wheezing  He has not had Asthma or COPD in the past  He has needed Albuterol in the past by PCP due to some symptoms of reactive airway  He was seen by pulmonology at that time without diagnosis   He does still have an inhaler and tried that last night without improvement  He denies a fever   He has not taken a COVID test   Former smoker quit in 2013 (cigarettes)  Was using Vape until a few months ago   Problems:  Patient Active Problem List   Diagnosis Date Noted   Gastroesophageal reflux disease with esophagitis without hemorrhage 07/07/2022   Nasal sinus congestion 07/07/2022   Tinnitus of both ears 07/07/2022   Capsulitis of right shoulder 06/30/2022   Carpal tunnel syndrome, right 06/30/2022   Closed traumatic dislocation of acromioclavicular joint 01/13/2022   Tear of  PCL (posterior cruciate ligament) of knee, left, subsequent encounter 07/08/2021   Rupture of anterior cruciate ligament of right knee 07/08/2021   Tear of LCL (lateral collateral ligament) of knee, right, initial encounter 07/08/2021   Anxiety 07/08/2021   Acromioclavicular joint injury, left, initial encounter 05/14/2021   Low testosterone 04/02/2021   Polyphagia 03/24/2021   Seasonal allergies 11/27/2020   Other hyperlipidemia 11/04/2020   Depression 09/01/2020    Vitamin D deficiency 08/14/2020   Insulin resistance 07/07/2020   Pain of joint of left ankle and foot 05/09/2020   Joint pain [id number]   Metabolic syndrome [id number]   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   Cervical radiculopathy 11/12/2019   Chronic pain of left knee 11/12/2019   Class 2 severe obesity with serious comorbidity and body mass index (BMI) of 36.0 to 36.9 in adult [facility]) 11/12/2019   Left sided sciatica 03/15/2018   Lytic bone lesions on xray 02/07/2018   Dependence on nicotine from chewing tobacco 12/15/2017   Obesity (BMI 30-39.9) 12/15/2017   OSA (obstructive sleep apnea) 12/15/2017    Allergies: No Known Allergies Medications:  Current Outpatient Medications:    albuterol (VENTOLIN HFA) 108 (90 Base) MCG/ACT inhaler, INHALE 2 PUFFS INTO THE LUNGS EVERY 6 HOURS AS NEEDED, Disp: 8.5 g, Rfl: 0   Biotin 10000 MCG TABS, Take by mouth., Disp: , Rfl:    esomeprazole (NEXIUM) 40 MG capsule, Take 1 capsule (40 mg total) by mouth daily before breakfast., Disp: 30 capsule, Rfl: 2   meclizine (ANTIVERT) 25 MG tablet, Take 1 tablet (25 mg total) by mouth 3 (three) times daily as needed for dizziness., Disp: 30 tablet, Rfl: 0   meloxicam (MOBIC) 15 MG tablet, Take 1 tablet (15 mg total) by mouth daily., Disp: 30 tablet, Rfl: 3   metFORMIN (GLUCOPHAGE) 500 MG tablet, Take 1 tablet (500 mg total) by mouth daily with breakfast., Disp: 60 tablet, Rfl: 3   Multiple Vitamin (MULTIVITAMIN) tablet, Take 1 tablet by mouth every other day., Disp: , Rfl:   Current Facility-Administered Medications:    methylPREDNISolone acetate (DEPO-MEDROL) injection 40 mg, 40 mg, Intramuscular, Once, [name], MD  Observations/Objective: Patient is well-developed, well-nourished in no acute distress.  Resting comfortably  at home.  Head is normocephalic, atraumatic.  No labored breathing.  Speech is clear and coherent with logical content.  Patient is alert and  oriented at baseline.    Assessment and Plan: 1. Bronchitis Continue Albuterol as directed  May alternate Mucinex with benzonatate to see what has greater effect  Try Mucinex during the day  Benzonatate before bed to help calm cough  Do not take both at the same time   - azithromycin (ZITHROMAX) 250 MG tablet; Take 2 tablets on day 1, then 1 tablet daily on days 2 through 5  Dispense: 6 tablet; Refill: 0 - predniSONE (DELTASONE) 20 MG tablet; Take 1 tablet (20 mg total) by mouth 2 (two) times daily with a meal for 5 days.  Dispense: 10 tablet; Refill: 0 - benzonatate (TESSALON) 100 MG capsule; Take 1 capsule (100 mg total) by mouth 3 (three) times daily as needed.  Dispense: 30 capsule; Refill: 0     Follow Up Instructions: I discussed the assessment and treatment plan with the patient. The patient was provided an opportunity to ask questions and all were answered. The patient agreed with the plan and demonstrated an understanding of the instructions.  A copy of instructions were sent to the patient  via MyChart unless otherwise noted below.    The patient was advised to call back or seek an in-person evaluation if the symptoms worsen or if the condition fails to improve as anticipated.  Time:  I spent 20 minutes with the patient via telehealth technology discussing the above problems/concerns.    Apolonio Schneiders, FNP

## 2023-03-07 ENCOUNTER — Encounter: Payer: Self-pay | Admitting: *Deleted

## 2023-03-10 ENCOUNTER — Encounter: Payer: Self-pay | Admitting: Podiatry

## 2023-03-10 ENCOUNTER — Ambulatory Visit: Payer: Managed Care, Other (non HMO) | Admitting: Podiatry

## 2023-03-10 DIAGNOSIS — M216X2 Other acquired deformities of left foot: Secondary | ICD-10-CM | POA: Diagnosis not present

## 2023-03-10 DIAGNOSIS — M778 Other enthesopathies, not elsewhere classified: Secondary | ICD-10-CM

## 2023-03-10 DIAGNOSIS — M7752 Other enthesopathy of left foot: Secondary | ICD-10-CM

## 2023-03-10 DIAGNOSIS — M216X1 Other acquired deformities of right foot: Secondary | ICD-10-CM

## 2023-03-10 NOTE — Progress Notes (Signed)
He presents today for follow-up of his capsulitis and Planter fasciitis bilaterally states that the right was doing better but the left when the pain feels like it is moved some is still hurts he says really has not gotten any better for better maybe for a day or so after the injection but is his knee and hip is starting to bother him now.  Objective: Vital signs are stable alert oriented x 3 no change in physical exam.  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  Plan: At this point I feel that orthotics are necessary for biomechanical control to help reduce symptomatology with Planter fasciitis to the lateral compensatory syndrome.  I like to follow-up with him once his orthotics come in.  May need to consider MRIs if not improved.

## 2023-03-16 ENCOUNTER — Ambulatory Visit: Payer: Managed Care, Other (non HMO) | Admitting: Medical

## 2023-03-17 ENCOUNTER — Ambulatory Visit (HOSPITAL_BASED_OUTPATIENT_CLINIC_OR_DEPARTMENT_OTHER)
Admission: RE | Admit: 2023-03-17 | Discharge: 2023-03-17 | Disposition: A | Discharge status: Home / Self Care | Payer: Managed Care, Other (non HMO) | Source: Ambulatory Visit | Attending: Medical | Admitting: Medical

## 2023-03-17 ENCOUNTER — Encounter (HOSPITAL_BASED_OUTPATIENT_CLINIC_OR_DEPARTMENT_OTHER): Payer: Self-pay

## 2023-03-17 ENCOUNTER — Telehealth: Payer: Self-pay

## 2023-03-17 ENCOUNTER — Ambulatory Visit: Payer: Managed Care, Other (non HMO) | Admitting: Medical

## 2023-03-17 VITALS — BP 130/84 | HR 77 | Resp 18 | Ht 70.0 in | Wt 259.0 lb

## 2023-03-17 DIAGNOSIS — M542 Cervicalgia: Secondary | ICD-10-CM | POA: Diagnosis not present

## 2023-03-17 DIAGNOSIS — Z6836 Body mass index (BMI) 36.0-36.9, adult: Secondary | ICD-10-CM | POA: Diagnosis not present

## 2023-03-17 DIAGNOSIS — R591 Generalized enlarged lymph nodes: Secondary | ICD-10-CM

## 2023-03-17 DIAGNOSIS — R519 Headache, unspecified: Secondary | ICD-10-CM

## 2023-03-17 DIAGNOSIS — Z0001 Encounter for general adult medical examination with abnormal findings: Secondary | ICD-10-CM | POA: Diagnosis not present

## 2023-03-17 DIAGNOSIS — E669 Obesity, unspecified: Secondary | ICD-10-CM

## 2023-03-17 DIAGNOSIS — Z0000 Encounter for general adult medical examination without abnormal findings: Secondary | ICD-10-CM

## 2023-03-17 MED ORDER — WEGOVY 0.25 MG/0.5ML ~~LOC~~ SOAJ
0.2500 mg | SUBCUTANEOUS | 0 refills | Status: DC
Start: 2023-03-17 — End: 2023-06-16

## 2023-03-17 NOTE — Addendum Note (Signed)
Addended by: Rosita Kea on: 03/17/2023 12:43 PM   Modules accepted: Orders

## 2023-03-17 NOTE — Addendum Note (Signed)
Addended by: Gwenevere Abbot on: 03/17/2023 04:56 PM   Modules accepted: Orders

## 2023-03-17 NOTE — Telephone Encounter (Signed)
Patient was seen in office today and discussed having physical labs done today due to him fasting. The only order in epic was ESR and patient only wants to get stuck once so he scheduled to have labs done 03/18/2023 and on the schedule or 8 am, patient will be fasting and would like for his physical labs to be placed in epic for this appointment.
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
## 2023-03-17 NOTE — Patient Instructions (Addendum)
For you wellness exam today I have ordered cbc, cmp and  lipid panel.  Vaccine up to date  Recommend exercise and healthy diet.  We will let you know lab results as they come in.  Follow up date appointment will be determined after lab review.    For obesity will rx wegovy, recommend continue diet and exercise. If wegovy covered want to see you back in 3 months for wt loss vistt  For rt neck pain intermittent and brief will get neck soft tissue US. Also you mentioned occasional temporal ha so will get sed rate. Can use tylenol or ibuprofen for transient low level pain. If pain worsening or more frequent let me know.  Preventive Care 50-34 Years Old, Male Preventive care refers to lifestyle choices and visits with your health care provider that can promote health and wellness. Preventive care visits are also called wellness exams. What can I expect for my preventive care visit? Counseling During your preventive care visit, your health care provider may ask about your: Medical history, including: Past medical problems. Family medical history. Current health, including: Emotional well-being. Home life and relationship well-being. Sexual activity. Lifestyle, including: Alcohol, nicotine or tobacco, and drug use. Access to firearms. Diet, exercise, and sleep habits. Safety issues such as seatbelt and bike helmet use. Sunscreen use. Work and work Astronomer. Physical exam Your health care provider may check your: Height and weight. These may be used to calculate your BMI (body mass index). BMI is a measurement that tells if you are at a healthy weight. Waist circumference. This measures the distance around your waistline. This measurement also tells if you are at a healthy weight and may help predict your risk of certain diseases, such as type 2 diabetes and high blood pressure. Heart rate and blood pressure. Body temperature. Skin for abnormal spots. What immunizations do I  need?  Vaccines are usually given at various ages, according to a schedule. Your health care provider will recommend vaccines for you based on your age, medical history, and lifestyle or other factors, such as travel or where you work. What tests do I need? Screening Your health care provider may recommend screening tests for certain conditions. This may include: Lipid and cholesterol levels. Diabetes screening. This is done by checking your blood sugar (glucose) after you have not eaten for a while (fasting). Hepatitis B test. Hepatitis C test. HIV (human immunodeficiency virus) test. STI (sexually transmitted infection) testing, if you are at risk. Talk with your health care provider about your test results, treatment options, and if necessary, the need for more tests. Follow these instructions at home: Eating and drinking  Eat a healthy diet that includes fresh fruits and vegetables, whole grains, lean protein, and low-fat dairy products. Drink enough fluid to keep your urine pale yellow. Take vitamin and mineral supplements as recommended by your health care provider. Do not drink alcohol if your health care provider tells you not to drink. If you drink alcohol: Limit how much you have to 0-2 drinks a day. Know how much alcohol is in your drink. In the [place] one drink equals one 12 oz bottle of beer (355 mL), one 5 oz glass of wine (148 mL), or one 1 oz glass of hard liquor (44 mL). Lifestyle Brush your teeth every morning and night with fluoride toothpaste. Floss one time each day. Exercise for at least 30 minutes 5 or more days each week. Do not use any products that contain nicotine or tobacco. These  products include cigarettes, chewing tobacco, and vaping devices, such as e-cigarettes. If you need help quitting, ask your health care provider. Do not use drugs. If you are sexually active, practice safe sex. Use a condom or other form of protection to prevent STIs. Find healthy  ways to manage stress, such as: Meditation, yoga, or listening to music. Journaling. Talking to a trusted person. Spending time with friends and family. Minimize exposure to UV radiation to reduce your risk of skin cancer. Safety Always wear your seat belt while driving or riding in a vehicle. Do not drive: If you have been drinking alcohol. Do not ride with someone who has been drinking. If you have been using any mind-altering substances or drugs. While texting. When you are tired or distracted. Wear a helmet and other protective equipment during sports activities. If you have firearms in your house, make sure you follow all gun safety procedures. Seek help if you have been physically or sexually abused. What's next? Go to your health care provider once a year for an annual wellness visit. Ask your health care provider how often you should have your eyes and teeth checked. Stay up to date on all vaccines. This information is not intended to replace advice given to you by your health care provider. Make sure you discuss any questions you have with your health care provider. Document Revised: 05/06/2021 Document Reviewed: 05/06/2021 Elsevier Patient Education  2023 [employer].

## 2023-03-18 ENCOUNTER — Other Ambulatory Visit (INDEPENDENT_AMBULATORY_CARE_PROVIDER_SITE_OTHER): Payer: Managed Care, Other (non HMO)

## 2023-03-18 ENCOUNTER — Ambulatory Visit
Admission: RE | Admit: 2023-03-18 | Discharge: 2023-03-18 | Disposition: A | Discharge status: Home / Self Care | Payer: Managed Care, Other (non HMO) | Source: Ambulatory Visit | Attending: Medical | Admitting: Medical

## 2023-03-18 DIAGNOSIS — R519 Headache, unspecified: Secondary | ICD-10-CM

## 2023-03-18 DIAGNOSIS — M542 Cervicalgia: Secondary | ICD-10-CM

## 2023-03-18 DIAGNOSIS — R591 Generalized enlarged lymph nodes: Secondary | ICD-10-CM

## 2023-03-18 DIAGNOSIS — Z0000 Encounter for general adult medical examination without abnormal findings: Secondary | ICD-10-CM | POA: Diagnosis not present

## 2023-03-18 LAB — LIPID PANEL
Cholesterol: 188 mg/dL (ref 0–200)
HDL: 42 mg/dL (ref >39.00)
LDL Cholesterol: 112 mg/dL — ABNORMAL HIGH (ref 0–99)
NonHDL: 146.01
Total CHOL/HDL Ratio: 4
Triglycerides: 169 mg/dL — ABNORMAL HIGH (ref 0.0–149.0)
VLDL: 33.8 mg/dL (ref 0.0–40.0)

## 2023-03-18 LAB — CBC WITH DIFFERENTIAL/PLATELET
Basophils Absolute: 0 10*3/uL (ref 0.0–0.1)
Basophils Relative: 0.8 % (ref 0.0–3.0)
Eosinophils Absolute: 0.1 10*3/uL (ref 0.0–0.7)
Eosinophils Relative: 2.7 % (ref 0.0–5.0)
HCT: 40.8 % (ref 39.0–52.0)
Hemoglobin: 13.7 g/dL (ref 13.0–17.0)
Lymphocytes Relative: 37.9 % (ref 12.0–46.0)
Lymphs Abs: 2.1 10*3/uL (ref 0.7–4.0)
MCHC: 33.6 g/dL (ref 30.0–36.0)
MCV: 82.6 fl (ref 78.0–100.0)
Monocytes Absolute: 0.5 10*3/uL (ref 0.1–1.0)
Monocytes Relative: 9.7 % (ref 3.0–12.0)
Neutro Abs: 2.7 10*3/uL (ref 1.4–7.7)
Neutrophils Relative %: 48.9 % (ref 43.0–77.0)
Platelets: 330 10*3/uL (ref 150.0–400.0)
RBC: 4.94 Mil/uL (ref 4.22–5.81)
RDW: 13 % (ref 11.5–15.5)
WBC: 5.6 10*3/uL (ref 4.0–10.5)

## 2023-03-18 LAB — COMPREHENSIVE METABOLIC PANEL
ALT: 27 U/L (ref 0–53)
AST: 18 U/L (ref 0–37)
Albumin: 4.3 g/dL (ref 3.5–5.2)
Alkaline Phosphatase: 56 U/L (ref 39–117)
BUN: 10 mg/dL (ref 6–23)
CO2: 27 mEq/L (ref 19–32)
Calcium: 9.3 mg/dL (ref 8.4–10.5)
Chloride: 103 mEq/L (ref 96–112)
Creatinine, Ser: 0.73 mg/dL (ref 0.40–1.50)
GFR: 118.41 mL/min (ref >60.00)
Glucose, Bld: 85 mg/dL (ref 70–99)
Potassium: 4.1 mEq/L (ref 3.5–5.1)
Sodium: 140 mEq/L (ref 135–145)
Total Bilirubin: 0.4 mg/dL (ref 0.2–1.2)
Total Protein: 7.1 g/dL (ref 6.0–8.3)

## 2023-03-18 LAB — SEDIMENTATION RATE: Sed Rate: 32 mm/hr — ABNORMAL HIGH (ref 0–15)

## 2023-03-19 MED ORDER — PREDNISONE 10 MG (21) PO TBPK: ORAL_TABLET | ORAL | 0 refills | Status: DC

## 2023-03-19 NOTE — Addendum Note (Signed)
Addended by: Gwenevere Abbot on: 03/19/2023 03:23 PM   Modules accepted: Orders

## 2023-04-21 ENCOUNTER — Ambulatory Visit: Payer: Managed Care, Other (non HMO) | Admitting: Podiatry

## 2023-04-21 ENCOUNTER — Ambulatory Visit: Payer: Managed Care, Other (non HMO)

## 2023-04-21 DIAGNOSIS — M7752 Other enthesopathy of left foot: Secondary | ICD-10-CM

## 2023-04-21 DIAGNOSIS — M778 Other enthesopathies, not elsewhere classified: Secondary | ICD-10-CM

## 2023-04-21 DIAGNOSIS — M722 Plantar fascial fibromatosis: Secondary | ICD-10-CM

## 2023-04-21 NOTE — Progress Notes (Signed)
Patient presents today to pick up custom molded foot orthotics recommended by Dr. HYATT.   Orthotics were dispensed and fit was satisfactory. Reviewed instructions for break-in and wear. Written instructions given to patient.  Patient will follow up as needed.     

## 2023-06-16 ENCOUNTER — Other Ambulatory Visit (HOSPITAL_BASED_OUTPATIENT_CLINIC_OR_DEPARTMENT_OTHER): Payer: Self-pay

## 2023-06-16 ENCOUNTER — Encounter: Payer: Self-pay | Admitting: Medical

## 2023-06-16 ENCOUNTER — Ambulatory Visit: Payer: Managed Care, Other (non HMO) | Admitting: Medical

## 2023-06-16 VITALS — BP 118/71 | HR 69 | Ht 70.0 in | Wt 258.0 lb

## 2023-06-16 DIAGNOSIS — M25511 Pain in right shoulder: Secondary | ICD-10-CM

## 2023-06-16 DIAGNOSIS — R739 Hyperglycemia, unspecified: Secondary | ICD-10-CM

## 2023-06-16 DIAGNOSIS — N469 Male infertility, unspecified: Secondary | ICD-10-CM

## 2023-06-16 DIAGNOSIS — E669 Obesity, unspecified: Secondary | ICD-10-CM

## 2023-06-16 DIAGNOSIS — E785 Hyperlipidemia, unspecified: Secondary | ICD-10-CM

## 2023-06-16 DIAGNOSIS — M25512 Pain in left shoulder: Secondary | ICD-10-CM

## 2023-06-16 DIAGNOSIS — Z6836 Body mass index (BMI) 36.0-36.9, adult: Secondary | ICD-10-CM

## 2023-06-16 LAB — COMPREHENSIVE METABOLIC PANEL
ALT: 31 U/L (ref 0–53)
AST: 22 U/L (ref 0–37)
Albumin: 4.6 g/dL (ref 3.5–5.2)
Alkaline Phosphatase: 69 U/L (ref 39–117)
BUN: 12 mg/dL (ref 6–23)
CO2: 29 mEq/L (ref 19–32)
Calcium: 9.8 mg/dL (ref 8.4–10.5)
Chloride: 102 mEq/L (ref 96–112)
Creatinine, Ser: 0.7 mg/dL (ref 0.40–1.50)
GFR: 119.71 mL/min (ref >60.00)
Glucose, Bld: 84 mg/dL (ref 70–99)
Potassium: 4.3 mEq/L (ref 3.5–5.1)
Sodium: 139 mEq/L (ref 135–145)
Total Bilirubin: 0.4 mg/dL (ref 0.2–1.2)
Total Protein: 7.4 g/dL (ref 6.0–8.3)

## 2023-06-16 LAB — LDL CHOLESTEROL, DIRECT: Direct LDL: 119 mg/dL

## 2023-06-16 LAB — HEMOGLOBIN A1C: Hgb A1c MFr Bld: 5.7 % (ref 4.6–6.5)

## 2023-06-16 LAB — LIPID PANEL
Cholesterol: 200 mg/dL (ref 0–200)
HDL: 36.8 mg/dL — ABNORMAL LOW (ref >39.00)
NonHDL: 163.39
Total CHOL/HDL Ratio: 5
Triglycerides: 318 mg/dL — ABNORMAL HIGH (ref 0.0–149.0)
VLDL: 63.6 mg/dL — ABNORMAL HIGH (ref 0.0–40.0)

## 2023-06-16 MED ORDER — FENOFIBRATE 48 MG PO TABS
48.0000 mg | ORAL_TABLET | Freq: Every day | ORAL | 3 refills | Status: DC
  Filled 2023-06-16: qty 90, 90d supply, fill #0

## 2023-06-16 MED ORDER — WEGOVY 0.25 MG/0.5ML ~~LOC~~ SOAJ
0.2500 mg | SUBCUTANEOUS | 0 refills | Status: DC
  Filled 2023-06-16: qty 2, 28d supply, fill #0

## 2023-06-16 NOTE — Patient Instructions (Signed)
Obesity: BMI 37, stable weight. Reginal Lutes previously prescribed but unavailable due to national shortage. No contraindications to [facility] use (no personal or family history of thyroid cancer, no history of pancreatitis). -Resend [id number] prescription to hospital-associated pharmacy to check availability.  Hyperglycemia: Mildly elevated blood sugars in the past, family history of diabetes. Metformin previously tried but discontinued due to gastrointestinal side effects. -Order A1C to assess for diabetes. If A1C in diabetic range, consider Ozempic instead of Wegovy.  Hyperlipidemia: History of elevated cholesterol, currently on a low cholesterol diet. -Order fasting lipid panel to assess current cholesterol levels.   - Refer to Dr. supple or bilateral shoulder pain.   -Give hand written order for sperm analysis.   Follow up date to be determined after lab review and depends if wegovy filled.

## 2023-06-16 NOTE — Progress Notes (Signed)
   Subjective:    Patient ID: Brian Obrien, male    DOB: Apr 17, 1986, 37 y.o.   MRN: [id number]  HPI  Discussed the use of AI scribe software for clinical note transcription with the patient, who gave verbal consent to proceed.  History of Present Illness   The patient, previously prescribed Wegovy for weight management, reports an inability to obtain the medication due to a national shortage. He has not been notified of its availability and is unsure if his insurance covers it. Despite this, he continues to exercise three times a week and maintain a mostly healthy diet. His weight has fluctuated slightly, but remains largely unchanged with a BMI of 37.  In the past, the patient tried metformin but experienced adverse effects including diarrhea and frequent urination, leading to its discontinuation.   The patient has a family history of parathyroid disease but denies any personal or family history of thyroid cancer or episodes of pancreatitis.    He also has a history of elevated cholesterol and has been attempting to manage it with a low cholesterol diet, avoiding fast food and fried foods.   Pt also has left shoulder pain since 2022. He had surgery for that in 2022. Pt state he still has pain in shoulder with clicking. Also rt shoulder pain for since 2022. He wants to see Dr. Rennis Chris.   Also he wants to get sperm analysis done since he and wife working up for infertility.       Review of Systems See hpi.    Objective:   Physical Exam  General- No acute distress. Pleasant patient. Neck- Full range of motion, no jvd Lungs- Clear, even and unlabored. Heart- regular rate and rhythm. Neurologic- CNII- XII grossly intact.  Bilateral shoulders- pain on range of motion. More pain on abduction. Left side has scar present      Assessment & Plan:  Assessment and Plan    Obesity: BMI 37, stable weight. Reginal Lutes previously prescribed but unavailable due to national shortage. No  contraindications to [facility] use (no personal or family history of thyroid cancer, no history of pancreatitis). -Resend [id number] prescription to hospital-associated pharmacy to check availability.  Hyperglycemia: Mildly elevated blood sugars in the past, family history of diabetes. Metformin previously tried but discontinued due to gastrointestinal side effects. -Order A1C to assess for diabetes. If A1C in diabetic range, consider Ozempic instead of Wegovy.  Hyperlipidemia: History of elevated cholesterol, currently on a low cholesterol diet. -Order fasting lipid panel to assess current cholesterol levels.   - Refer to Dr. supple or bilateral shoulder pain.   -Give hand written order for sperm analysis.   Follow up date to be determined after lab review and depends if wegovy filled.

## 2023-06-16 NOTE — Addendum Note (Signed)
Addended by: Gwenevere Abbot on: 06/16/2023 07:43 PM   Modules accepted: Orders

## 2023-06-17 ENCOUNTER — Other Ambulatory Visit (HOSPITAL_BASED_OUTPATIENT_CLINIC_OR_DEPARTMENT_OTHER): Payer: Self-pay

## 2023-06-30 ENCOUNTER — Other Ambulatory Visit (HOSPITAL_BASED_OUTPATIENT_CLINIC_OR_DEPARTMENT_OTHER): Payer: Self-pay

## 2023-08-11 ENCOUNTER — Encounter: Payer: Self-pay | Admitting: Podiatry

## 2023-08-11 ENCOUNTER — Ambulatory Visit: Payer: Managed Care, Other (non HMO)

## 2023-08-11 ENCOUNTER — Ambulatory Visit: Payer: Managed Care, Other (non HMO) | Admitting: Podiatry

## 2023-08-11 DIAGNOSIS — M66871 Spontaneous rupture of other tendons, right ankle and foot: Secondary | ICD-10-CM | POA: Diagnosis not present

## 2023-08-11 DIAGNOSIS — M66872 Spontaneous rupture of other tendons, left ankle and foot: Secondary | ICD-10-CM

## 2023-08-11 DIAGNOSIS — S93699A Other sprain of unspecified foot, initial encounter: Secondary | ICD-10-CM

## 2023-08-14 NOTE — Progress Notes (Signed)
She presents today for follow-up of her plantar fasciitis bilaterally.  She states that her ankles are even hurting now states that is affecting her ability perform her daily activities and maintain her general good health.  She states that she really just does not know what else to do she is tried everything including the injections steroids nonsteroidals stretching exercises orthotics and shoe use even in the house.  Objective: Vital signs stable alert oriented x 3 severe pain on palpation medial calcaneal tubercle left foot appears to be worse than the right.  Assessment: Pain in limb secondary to plantar fasciitis.  Most likely there is a plantar fascial tear that is inhibiting this from progressing.  Plan: At this point we are going to request MRI bilateral rear foot.  For probably possible posterior tibial tear and plantar fascial tear bilateral.  This will be for differential diagnoses as well as surgical planning.
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
## 2023-08-18 ENCOUNTER — Ambulatory Visit: Payer: Managed Care, Other (non HMO) | Attending: Orthopedic Surgery | Admitting: Physical Therapy

## 2023-08-18 ENCOUNTER — Encounter: Payer: Self-pay | Admitting: Physical Therapy

## 2023-08-18 ENCOUNTER — Other Ambulatory Visit: Payer: Self-pay

## 2023-08-18 DIAGNOSIS — M6281 Muscle weakness (generalized): Secondary | ICD-10-CM | POA: Diagnosis present

## 2023-08-18 DIAGNOSIS — G8929 Other chronic pain: Secondary | ICD-10-CM | POA: Diagnosis present

## 2023-08-18 DIAGNOSIS — M25511 Pain in right shoulder: Secondary | ICD-10-CM | POA: Diagnosis present

## 2023-08-18 DIAGNOSIS — M25512 Pain in left shoulder: Secondary | ICD-10-CM | POA: Diagnosis present

## 2023-08-18 DIAGNOSIS — R293 Abnormal posture: Secondary | ICD-10-CM | POA: Insufficient documentation

## 2023-08-25 ENCOUNTER — Ambulatory Visit: Payer: Managed Care, Other (non HMO) | Attending: Orthopedic Surgery

## 2023-08-25 DIAGNOSIS — M6281 Muscle weakness (generalized): Secondary | ICD-10-CM | POA: Insufficient documentation

## 2023-08-25 DIAGNOSIS — G8929 Other chronic pain: Secondary | ICD-10-CM | POA: Insufficient documentation

## 2023-08-25 DIAGNOSIS — M25512 Pain in left shoulder: Secondary | ICD-10-CM | POA: Diagnosis present

## 2023-08-25 DIAGNOSIS — M25511 Pain in right shoulder: Secondary | ICD-10-CM | POA: Diagnosis present

## 2023-08-25 DIAGNOSIS — R293 Abnormal posture: Secondary | ICD-10-CM | POA: Diagnosis present
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
## 2023-09-01 ENCOUNTER — Ambulatory Visit: Payer: Managed Care, Other (non HMO) | Admitting: Physical Therapy

## 2023-09-01 ENCOUNTER — Encounter: Payer: Self-pay | Admitting: Physical Therapy

## 2023-09-01 DIAGNOSIS — M6281 Muscle weakness (generalized): Secondary | ICD-10-CM

## 2023-09-01 DIAGNOSIS — G8929 Other chronic pain: Secondary | ICD-10-CM

## 2023-09-01 DIAGNOSIS — M25511 Pain in right shoulder: Secondary | ICD-10-CM | POA: Diagnosis not present

## 2023-09-01 DIAGNOSIS — R293 Abnormal posture: Secondary | ICD-10-CM
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
## 2023-09-08 ENCOUNTER — Ambulatory Visit: Payer: Managed Care, Other (non HMO)

## 2023-09-08 DIAGNOSIS — M6281 Muscle weakness (generalized): Secondary | ICD-10-CM

## 2023-09-08 DIAGNOSIS — M25511 Pain in right shoulder: Secondary | ICD-10-CM | POA: Diagnosis not present

## 2023-09-08 DIAGNOSIS — R293 Abnormal posture: Secondary | ICD-10-CM

## 2023-09-08 DIAGNOSIS — G8929 Other chronic pain: Secondary | ICD-10-CM
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
## 2023-09-13 ENCOUNTER — Ambulatory Visit
Admission: RE | Admit: 2023-09-13 | Discharge: 2023-09-13 | Disposition: A | Discharge status: Home / Self Care | Payer: Managed Care, Other (non HMO) | Source: Ambulatory Visit | Attending: Podiatry | Admitting: Podiatry

## 2023-09-13 DIAGNOSIS — M66871 Spontaneous rupture of other tendons, right ankle and foot: Secondary | ICD-10-CM

## 2023-09-13 DIAGNOSIS — M66872 Spontaneous rupture of other tendons, left ankle and foot: Secondary | ICD-10-CM

## 2023-09-13 DIAGNOSIS — S93699A Other sprain of unspecified foot, initial encounter: Secondary | ICD-10-CM

## 2023-09-15 ENCOUNTER — Ambulatory Visit: Payer: Managed Care, Other (non HMO) | Admitting: Physical Therapy

## 2023-09-22 ENCOUNTER — Ambulatory Visit: Payer: Managed Care, Other (non HMO)

## 2023-09-22 DIAGNOSIS — M25511 Pain in right shoulder: Secondary | ICD-10-CM | POA: Diagnosis not present

## 2023-09-22 DIAGNOSIS — G8929 Other chronic pain: Secondary | ICD-10-CM

## 2023-09-22 DIAGNOSIS — M6281 Muscle weakness (generalized): Secondary | ICD-10-CM

## 2023-09-22 DIAGNOSIS — R293 Abnormal posture: Secondary | ICD-10-CM
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
## 2023-09-29 ENCOUNTER — Ambulatory Visit: Payer: Managed Care, Other (non HMO) | Attending: Orthopedic Surgery | Admitting: Physical Therapy

## 2023-09-29 ENCOUNTER — Encounter: Payer: Self-pay | Admitting: Physical Therapy

## 2023-09-29 DIAGNOSIS — G8929 Other chronic pain: Secondary | ICD-10-CM | POA: Insufficient documentation

## 2023-09-29 DIAGNOSIS — M6281 Muscle weakness (generalized): Secondary | ICD-10-CM | POA: Diagnosis present

## 2023-09-29 DIAGNOSIS — M542 Cervicalgia: Secondary | ICD-10-CM | POA: Insufficient documentation

## 2023-09-29 DIAGNOSIS — R293 Abnormal posture: Secondary | ICD-10-CM | POA: Diagnosis present

## 2023-09-29 DIAGNOSIS — M25511 Pain in right shoulder: Secondary | ICD-10-CM | POA: Diagnosis present

## 2023-09-29 DIAGNOSIS — M25512 Pain in left shoulder: Secondary | ICD-10-CM | POA: Insufficient documentation
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
## 2023-10-14 ENCOUNTER — Encounter: Payer: Self-pay | Admitting: Physical Therapy

## 2023-10-17 ENCOUNTER — Other Ambulatory Visit: Payer: Self-pay

## 2023-10-17 DIAGNOSIS — M7752 Other enthesopathy of left foot: Secondary | ICD-10-CM

## 2023-10-17 DIAGNOSIS — M778 Other enthesopathies, not elsewhere classified: Secondary | ICD-10-CM

## 2023-10-17 DIAGNOSIS — M722 Plantar fascial fibromatosis: Secondary | ICD-10-CM

## 2023-10-17 DIAGNOSIS — M66872 Spontaneous rupture of other tendons, left ankle and foot: Secondary | ICD-10-CM

## 2023-10-26 ENCOUNTER — Other Ambulatory Visit: Payer: Self-pay | Admitting: Orthopedic Surgery

## 2023-10-26 DIAGNOSIS — G8929 Other chronic pain: Secondary | ICD-10-CM

## 2023-10-27 ENCOUNTER — Ambulatory Visit: Payer: Managed Care, Other (non HMO) | Attending: Podiatry | Admitting: Physical Therapy

## 2023-10-27 ENCOUNTER — Encounter: Payer: Self-pay | Admitting: Physical Therapy

## 2023-10-27 ENCOUNTER — Other Ambulatory Visit: Payer: Self-pay

## 2023-10-27 DIAGNOSIS — M25572 Pain in left ankle and joints of left foot: Secondary | ICD-10-CM | POA: Diagnosis present

## 2023-10-27 DIAGNOSIS — R262 Difficulty in walking, not elsewhere classified: Secondary | ICD-10-CM

## 2023-10-27 DIAGNOSIS — M25561 Pain in right knee: Secondary | ICD-10-CM | POA: Insufficient documentation

## 2023-10-27 DIAGNOSIS — M6281 Muscle weakness (generalized): Secondary | ICD-10-CM

## 2023-10-27 DIAGNOSIS — G8929 Other chronic pain: Secondary | ICD-10-CM

## 2023-10-27 DIAGNOSIS — M66872 Spontaneous rupture of other tendons, left ankle and foot: Secondary | ICD-10-CM | POA: Insufficient documentation

## 2023-10-27 DIAGNOSIS — M25571 Pain in right ankle and joints of right foot: Secondary | ICD-10-CM

## 2023-10-27 DIAGNOSIS — M7752 Other enthesopathy of left foot: Secondary | ICD-10-CM | POA: Insufficient documentation

## 2023-10-27 DIAGNOSIS — M25562 Pain in left knee: Secondary | ICD-10-CM | POA: Insufficient documentation

## 2023-10-27 DIAGNOSIS — M722 Plantar fascial fibromatosis: Secondary | ICD-10-CM | POA: Insufficient documentation

## 2023-10-27 DIAGNOSIS — M778 Other enthesopathies, not elsewhere classified: Secondary | ICD-10-CM | POA: Insufficient documentation
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
## 2023-11-01 ENCOUNTER — Ambulatory Visit
Admission: RE | Admit: 2023-11-01 | Discharge: 2023-11-01 | Disposition: A | Discharge status: Home / Self Care | Payer: Managed Care, Other (non HMO) | Source: Ambulatory Visit | Attending: Orthopedic Surgery | Admitting: Orthopedic Surgery

## 2023-11-01 DIAGNOSIS — G8929 Other chronic pain: Secondary | ICD-10-CM

## 2023-11-02 ENCOUNTER — Ambulatory Visit
Admission: RE | Admit: 2023-11-02 | Discharge: 2023-11-02 | Disposition: A | Discharge status: Home / Self Care | Payer: Managed Care, Other (non HMO) | Source: Ambulatory Visit | Attending: Orthopedic Surgery | Admitting: Orthopedic Surgery

## 2023-11-02 DIAGNOSIS — G8929 Other chronic pain: Secondary | ICD-10-CM

## 2023-11-04 ENCOUNTER — Ambulatory Visit: Payer: Managed Care, Other (non HMO)

## 2023-11-09 ENCOUNTER — Encounter: Payer: Self-pay | Admitting: Physical Therapy

## 2023-11-09 ENCOUNTER — Ambulatory Visit: Payer: Managed Care, Other (non HMO) | Attending: Podiatry | Admitting: Physical Therapy

## 2023-11-09 DIAGNOSIS — M25561 Pain in right knee: Secondary | ICD-10-CM | POA: Diagnosis not present

## 2023-11-09 DIAGNOSIS — M6281 Muscle weakness (generalized): Secondary | ICD-10-CM

## 2023-11-09 DIAGNOSIS — R262 Difficulty in walking, not elsewhere classified: Secondary | ICD-10-CM

## 2023-11-09 DIAGNOSIS — G8929 Other chronic pain: Secondary | ICD-10-CM

## 2023-11-09 DIAGNOSIS — M25572 Pain in left ankle and joints of left foot: Secondary | ICD-10-CM

## 2023-11-09 DIAGNOSIS — M25571 Pain in right ankle and joints of right foot: Secondary | ICD-10-CM
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
## 2023-11-21 ENCOUNTER — Other Ambulatory Visit: Payer: Managed Care, Other (non HMO)

## 2023-11-21 ENCOUNTER — Inpatient Hospital Stay: Admission: RE | Admit: 2023-11-21 | Payer: Managed Care, Other (non HMO) | Source: Ambulatory Visit

## 2023-11-24 ENCOUNTER — Ambulatory Visit: Payer: Managed Care, Other (non HMO) | Attending: Podiatry

## 2023-11-24 DIAGNOSIS — M25571 Pain in right ankle and joints of right foot: Secondary | ICD-10-CM | POA: Diagnosis present

## 2023-11-24 DIAGNOSIS — G8929 Other chronic pain: Secondary | ICD-10-CM | POA: Insufficient documentation

## 2023-11-24 DIAGNOSIS — R262 Difficulty in walking, not elsewhere classified: Secondary | ICD-10-CM | POA: Insufficient documentation

## 2023-11-24 DIAGNOSIS — M6281 Muscle weakness (generalized): Secondary | ICD-10-CM | POA: Diagnosis present

## 2023-11-24 DIAGNOSIS — M25561 Pain in right knee: Secondary | ICD-10-CM | POA: Insufficient documentation

## 2023-11-24 DIAGNOSIS — M25572 Pain in left ankle and joints of left foot: Secondary | ICD-10-CM | POA: Insufficient documentation

## 2023-11-24 DIAGNOSIS — M25562 Pain in left knee: Secondary | ICD-10-CM | POA: Insufficient documentation
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
## 2023-11-26 ENCOUNTER — Telehealth: Payer: Managed Care, Other (non HMO)

## 2023-11-26 ENCOUNTER — Telehealth: Payer: Managed Care, Other (non HMO) | Admitting: Family Medicine

## 2023-11-26 DIAGNOSIS — J40 Bronchitis, not specified as acute or chronic: Secondary | ICD-10-CM

## 2023-11-26 MED ORDER — AZITHROMYCIN 250 MG PO TABS: ORAL_TABLET | ORAL | 0 refills | Status: AC

## 2023-11-26 MED ORDER — PROMETHAZINE-DM 6.25-15 MG/5ML PO SYRP: 5.0000 mL | ORAL_SOLUTION | Freq: Four times a day (QID) | ORAL | 0 refills | Status: AC | PRN

## 2023-11-26 NOTE — Progress Notes (Signed)
 Virtual Visit Consent   Geremy Rister, you are scheduled for a virtual visit with a [facility] provider today. Just as with appointments in the office, your consent must be obtained to participate. Your consent will be active for this visit and any virtual visit you may have with one of our providers in the next 365 days. If you have a MyChart account, a copy of this consent can be sent to you electronically.  As this is a virtual visit, video technology does not allow for your provider to perform a traditional examination. This may limit your provider's ability to fully assess your condition. If your provider identifies any concerns that need to be evaluated in person or the need to arrange testing (such as labs, EKG, etc.), we will make arrangements to do so. Although advances in technology are sophisticated, we cannot ensure that it will always work on either your end or our end. If the connection with a video visit is poor, the visit may have to be switched to a telephone visit. With either a video or telephone visit, we are not always able to ensure that we have a secure connection.  By engaging in this virtual visit, you consent to the provision of healthcare and authorize for your insurance to be billed (if applicable) for the services provided during this visit. Depending on your insurance coverage, you may receive a charge related to this service.  I need to obtain your verbal consent now. Are you willing to proceed with your visit today? Petar Mucci has provided verbal consent on 11/26/2023 for a virtual visit (video or telephone). Loa Lamp, FNP  Date: 11/26/2023 10:21 AM  Virtual Visit via Video Note   I, Loa Lamp, connected with  Yidel Teuscher  ([id number], 28-May-1986) on 11/26/23 at 10:15 AM EST by a video-enabled telemedicine application and verified that I am speaking with the correct person using two identifiers.  Location: Patient: Virtual Visit Location Patient:  Home Provider: Virtual Visit Location Provider: Home Office   I discussed the limitations of evaluation and management by telemedicine and the availability of in person appointments. The patient expressed understanding and agreed to proceed.    History of Present Illness: Savannah Erbe is a 38 y.o. who identifies as a male who was assigned male at birth, and is being seen today for cough for 15 days with dark brown mucus, head congestion, sinus pain and pressure. Robitussin not helping. No fever, wheezing and sob.   HPI: HPI  Problems:  Patient Active Problem List   Diagnosis Date Noted   Gastroesophageal reflux disease with esophagitis without hemorrhage 07/07/2022   Nasal sinus congestion 07/07/2022   Tinnitus of both ears 07/07/2022   Capsulitis of right shoulder 06/30/2022   Carpal tunnel syndrome, right 06/30/2022   Closed traumatic dislocation of acromioclavicular joint 01/13/2022   Tear of PCL (posterior cruciate ligament) of knee, left, subsequent encounter 07/08/2021   Rupture of anterior cruciate ligament of right knee 07/08/2021   Tear of LCL (lateral collateral ligament) of knee, right, initial encounter 07/08/2021   Anxiety 07/08/2021   Acromioclavicular joint injury, left, initial encounter 05/14/2021   Low testosterone 04/02/2021   Polyphagia 03/24/2021   Seasonal allergies 11/27/2020   Other hyperlipidemia 11/04/2020   Depression 09/01/2020   Vitamin D  deficiency 08/14/2020   Insulin  resistance 07/07/2020   Pain of joint of left ankle and foot 05/09/2020   Joint pain 04/03/2020   Metabolic syndrome 12/25/2019   Chronic bilateral low back pain with  left-sided sciatica 11/12/2019   Cervical radiculopathy 11/12/2019   Chronic pain of left knee 11/12/2019   Class 2 severe obesity with serious comorbidity and body mass index (BMI) of 36.0 to 36.9 in adult [facility]) 11/12/2019   Left sided sciatica 03/15/2018   Lytic bone lesions on xray 02/07/2018   Dependence on  nicotine from chewing tobacco 12/15/2017   Obesity (BMI 30-39.9) 12/15/2017   OSA (obstructive sleep apnea) 12/15/2017    Allergies: No Known Allergies Medications:  Current Outpatient Medications:    azithromycin  (ZITHROMAX ) 250 MG tablet, Take 2 tablets on day 1, then 1 tablet daily on days 2 through 5, Disp: 6 tablet, Rfl: 0   promethazine -dextromethorphan (PROMETHAZINE -DM) 6.25-15 MG/5ML syrup, Take 5 mLs by mouth 4 (four) times daily as needed for up to 10 days for cough., Disp: 118 mL, Rfl: 0   albuterol  (VENTOLIN  HFA) 108 (90 Base) MCG/ACT inhaler, INHALE 2 PUFFS INTO THE LUNGS EVERY 6 HOURS AS NEEDED, Disp: 8.5 g, Rfl: 0   Biotin 89999 MCG TABS, Take by mouth., Disp: , Rfl:    esomeprazole  (NEXIUM ) 40 MG capsule, Take 1 capsule (40 mg total) by mouth daily before breakfast. (Patient taking differently: Take 40 mg by mouth daily as needed.), Disp: 30 capsule, Rfl: 2   fenofibrate  (TRICOR ) 48 MG tablet, Take 1 tablet (48 mg total) by mouth daily. (Patient not taking: Reported on 08/18/2023), Disp: 90 tablet, Rfl: 3   meclizine  (ANTIVERT ) 25 MG tablet, Take 1 tablet (25 mg total) by mouth 3 (three) times daily as needed for dizziness., Disp: 30 tablet, Rfl: 0   meloxicam  (MOBIC ) 15 MG tablet, Take 1 tablet (15 mg total) by mouth daily. (Patient not taking: Reported on 06/16/2023), Disp: 30 tablet, Rfl: 3   metFORMIN  (GLUCOPHAGE ) 500 MG tablet, Take 1 tablet (500 mg total) by mouth daily with breakfast. (Patient not taking: Reported on 06/16/2023), Disp: 60 tablet, Rfl: 3   Multiple Vitamin (MULTIVITAMIN) tablet, Take 1 tablet by mouth every other day., Disp: , Rfl:    predniSONE  (STERAPRED UNI-PAK 21 TAB) 10 MG (21) TBPK tablet, Standard Taper over 6 days. (Patient not taking: Reported on 06/16/2023), Disp: 21 tablet, Rfl: 0   Semaglutide -Weight Management (WEGOVY ) 0.25 MG/0.5ML SOAJ, Inject 0.25 mg into the skin once a week. (Patient not taking: Reported on 08/18/2023), Disp: 2 mL, Rfl:  0  Observations/Objective: Patient is well-developed, well-nourished in no acute distress.  Resting comfortably  at home.  Head is normocephalic, atraumatic.  No labored breathing.  Speech is clear and coherent with logical content.  Patient is alert and oriented at baseline.    Assessment and Plan: 1. Bronchitis (Primary)  Increase fluids humidifier at night, tylenol  or ibuprofen as directed, mucinex . UC if sx worsen.  Follow Up Instructions: I discussed the assessment and treatment plan with the patient. The patient was provided an opportunity to ask questions and all were answered. The patient agreed with the plan and demonstrated an understanding of the instructions.  A copy of instructions were sent to the patient via MyChart unless otherwise noted below.     The patient was advised to call back or seek an in-person evaluation if the symptoms worsen or if the condition fails to improve as anticipated.    Jamarl Pew, FNP

## 2023-11-26 NOTE — Patient Instructions (Signed)
 bAcute Bronchitis, Adult  Acute bronchitis is sudden inflammation of the main airways (bronchi) that come off the windpipe (trachea) in the lungs. The swelling causes the airways to get smaller and make more mucus than normal. This can make it hard to breathe and can cause coughing or noisy breathing (wheezing). Acute bronchitis may last several weeks. The cough may last longer. Allergies, asthma, and exposure to smoke may make the condition worse. What are the causes? This condition can be caused by germs and by substances that irritate the lungs, including: Cold and flu viruses. The most common cause of this condition is the virus that causes the common cold. Bacteria. This is less common. Breathing in substances that irritate the lungs, including: Smoke from cigarettes and other forms of tobacco. Dust and pollen. Fumes from household cleaning products, gases, or burned fuel. Indoor or outdoor air pollution. What increases the risk? The following factors may make you more likely to develop this condition: A weak body's defense system, also called the immune system. A condition that affects your lungs and breathing, such as asthma. What are the signs or symptoms? Common symptoms of this condition include: Coughing. This may bring up clear, yellow, or green mucus from your lungs (sputum). Wheezing. Runny or stuffy nose. Having too much mucus in your lungs (chest congestion). Shortness of breath. Aches and pains, including sore throat or chest. How is this diagnosed? This condition is usually diagnosed based on: Your symptoms and medical history. A physical exam. You may also have other tests, including tests to rule out other conditions, such as pneumonia. These tests include: A test of lung function. Test of a mucus sample to look for the presence of bacteria. Tests to check the oxygen level in your blood. Blood tests. Chest X-ray. How is this treated? Most cases of acute  bronchitis clear up over time without treatment. Your health care provider may recommend: Drinking more fluids to help thin your mucus so it is easier to cough up. Taking inhaled medicine (inhaler) to improve air flow in and out of your lungs. Using a vaporizer or a humidifier. These are machines that add water to the air to help you breathe better. Taking a medicine that thins mucus and clears congestion (expectorant). Taking a medicine that prevents or stops coughing (cough suppressant). It is not common to take an antibiotic medicine for this condition. Follow these instructions at home:  Take over-the-counter and prescription medicines only as told by your health care provider. Use an inhaler, vaporizer, or humidifier as told by your health care provider. Take two teaspoons (10 mL) of honey at bedtime to lessen coughing at night. Drink enough fluid to keep your urine pale yellow. Do not use any products that contain nicotine or tobacco. These products include cigarettes, chewing tobacco, and vaping devices, such as e-cigarettes. If you need help quitting, ask your health care provider. Get plenty of rest. Return to your normal activities as told by your health care provider. Ask your health care provider what activities are safe for you. Keep all follow-up visits. This is important. How is this prevented? To lower your risk of getting this condition again: Wash your hands often with soap and water for at least 20 seconds. If soap and water are not available, use hand sanitizer. Avoid contact with people who have cold symptoms. Try not to touch your mouth, nose, or eyes with your hands. Avoid breathing in smoke or chemical fumes. Breathing smoke or chemical fumes will make  your condition worse. Get the flu shot every year. Contact a health care provider if: Your symptoms do not improve after 2 weeks. You have trouble coughing up the mucus. Your cough keeps you awake at night. You have  a fever. Get help right away if you: Cough up blood. Feel pain in your chest. Have severe shortness of breath. Faint or keep feeling like you are going to faint. Have a severe headache. Have a fever or chills that get worse. These symptoms may represent a serious problem that is an emergency. Do not wait to see if the symptoms will go away. Get medical help right away. Call your local emergency services (911 in the [place] Do not drive yourself to the hospital. Summary Acute bronchitis is inflammation of the main airways (bronchi) that come off the windpipe (trachea) in the lungs. The swelling causes the airways to get smaller and make more mucus than normal. Drinking more fluids can help thin your mucus so it is easier to cough up. Take over-the-counter and prescription medicines only as told by your health care provider. Do not use any products that contain nicotine or tobacco. These products include cigarettes, chewing tobacco, and vaping devices, such as e-cigarettes. If you need help quitting, ask your health care provider. Contact a health care provider if your symptoms do not improve after 2 weeks. This information is not intended to replace advice given to you by your health care provider. Make sure you discuss any questions you have with your health care provider. Document Revised: 02/18/2022 Document Reviewed: 03/11/2021 Elsevier Patient Education  2024 [employer].

## 2023-12-01 ENCOUNTER — Ambulatory Visit: Payer: Managed Care, Other (non HMO)

## 2023-12-01 DIAGNOSIS — M25572 Pain in left ankle and joints of left foot: Secondary | ICD-10-CM

## 2023-12-01 DIAGNOSIS — R262 Difficulty in walking, not elsewhere classified: Secondary | ICD-10-CM

## 2023-12-01 DIAGNOSIS — G8929 Other chronic pain: Secondary | ICD-10-CM

## 2023-12-01 DIAGNOSIS — M25561 Pain in right knee: Secondary | ICD-10-CM | POA: Diagnosis not present

## 2023-12-01 DIAGNOSIS — M25571 Pain in right ankle and joints of right foot: Secondary | ICD-10-CM
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
## 2023-12-15 ENCOUNTER — Ambulatory Visit: Payer: Managed Care, Other (non HMO) | Admitting: Physical Therapy

## 2023-12-15 ENCOUNTER — Encounter: Payer: Self-pay | Admitting: Physical Therapy

## 2023-12-15 DIAGNOSIS — M25561 Pain in right knee: Secondary | ICD-10-CM | POA: Diagnosis not present

## 2023-12-15 DIAGNOSIS — M25571 Pain in right ankle and joints of right foot: Secondary | ICD-10-CM

## 2023-12-15 DIAGNOSIS — G8929 Other chronic pain: Secondary | ICD-10-CM

## 2023-12-15 DIAGNOSIS — M25572 Pain in left ankle and joints of left foot: Secondary | ICD-10-CM

## 2024-01-18 ENCOUNTER — Ambulatory Visit: Payer: Managed Care, Other (non HMO) | Admitting: Medical

## 2024-01-18 ENCOUNTER — Ambulatory Visit (HOSPITAL_BASED_OUTPATIENT_CLINIC_OR_DEPARTMENT_OTHER)
Admission: RE | Admit: 2024-01-18 | Discharge: 2024-01-18 | Disposition: A | Discharge status: Home / Self Care | Payer: Managed Care, Other (non HMO) | Source: Ambulatory Visit | Attending: Medical | Admitting: Medical

## 2024-01-18 ENCOUNTER — Encounter: Payer: Self-pay | Admitting: Medical

## 2024-01-18 VITALS — BP 132/82 | HR 100 | Temp 99.1°F | Resp 18 | Ht 70.0 in | Wt 267.0 lb

## 2024-01-18 DIAGNOSIS — U071 COVID-19: Secondary | ICD-10-CM | POA: Insufficient documentation

## 2024-01-18 DIAGNOSIS — R059 Cough, unspecified: Secondary | ICD-10-CM | POA: Diagnosis present

## 2024-01-18 DIAGNOSIS — R062 Wheezing: Secondary | ICD-10-CM | POA: Diagnosis present

## 2024-01-18 DIAGNOSIS — H9203 Otalgia, bilateral: Secondary | ICD-10-CM | POA: Diagnosis not present

## 2024-01-18 LAB — POCT INFLUENZA A/B
Influenza A, POC: NEGATIVE
Influenza B, POC: NEGATIVE

## 2024-01-18 LAB — POC COVID19 BINAXNOW: SARS Coronavirus 2 Ag: POSITIVE — AB

## 2024-01-18 MED ORDER — AZITHROMYCIN 250 MG PO TABS: ORAL_TABLET | ORAL | 0 refills | Status: AC

## 2024-01-18 MED ORDER — BENZONATATE 100 MG PO CAPS: 100.0000 mg | ORAL_CAPSULE | Freq: Three times a day (TID) | ORAL | 0 refills | Status: DC | PRN

## 2024-01-18 MED ORDER — FLUTICASONE PROPIONATE 50 MCG/ACT NA SUSP: 2.0000 | Freq: Every day | NASAL | 1 refills | Status: AC

## 2024-01-18 MED ORDER — NIRMATRELVIR/RITONAVIR (PAXLOVID)TABLET: 3.0000 | ORAL_TABLET | Freq: Two times a day (BID) | ORAL | 0 refills | Status: AC

## 2024-01-18 MED ORDER — ALBUTEROL SULFATE HFA 108 (90 BASE) MCG/ACT IN AERS: 2.0000 | INHALATION_SPRAY | Freq: Four times a day (QID) | RESPIRATORY_TRACT | 0 refills | Status: AC | PRN

## 2024-01-18 NOTE — Patient Instructions (Signed)
 COVID-19 Positive test with symptoms of fever, cough, congestion, and body aches for 4 days. Wheezing for 2 months prior to COVID-19 diagnosis. -Conservative treatment with Flonase for congestion, cough tablets, Tylenol or ibuprofen for body aches, and hydration. -Prescribe Paxlovid due to underlying wheezing.  Possible Reactive Airway Disease/Asthma Wheezing for 2 months prior to COVID-19 diagnosis. No formal diagnosis of asthma but symptoms suggest possible reactive airway disease. -Prescribe inhaler for wheezing.  Possible Ear Infection Pain in both ears with coughing, more severe on the left. Possible bacterial infection following viral illness. -Prescribe Azithromycin for potential ear infection.  Follow-up date to be determined after xray review.  Consider chest x-ray due to ongoing wheezing and cough for 2 months.

## 2024-01-18 NOTE — Progress Notes (Addendum)
 Subjective:    Patient ID: Brian Obrien, male    DOB: [birth year], 38 y.o.   MRN: [id number]  HPI  Discussed the use of AI scribe software for clinical note transcription with the patient, who gave verbal consent to proceed.  History of Present Illness   Brian Obrien is a 38 year old male who presents with acute respiratory symptoms and a history of prolonged cough and wheezing.  He has been acutely ill for the past four days, starting with a sore throat and pain radiating to both ears when coughing. He developed congestion and is now expectorating brownish-green mucus. He also experiences rhinorrhea, nasal congestion, headache, and fever, which was 101F last night and 100.6-100.36F this morning. He has been taking Tylenol 500 mg and ibuprofen for fever management.  He has been experiencing wheezing for the past two months, which began after an illness around Christmas. During a video visit at that time, he was prescribed azithromycin and cough syrup, which provided temporary relief. However, his cough persisted, and his breathing never normalized. No previous diagnosis of asthma, although he was evaluated by a pulmonologist a couple of years ago who ruled it out. Despite this, he continues to experience intermittent cough and wheezing.  He reports body aches and significant fatigue over the past few days, stating he has 'no energy'. He also mentions reduced fluid intake recently.  He reports ear pain when coughing, with pain radiating to both ears.         Review of Systems  Constitutional:  Positive for fatigue and fever.  HENT:  Positive for congestion, postnasal drip and sore throat.   Respiratory:  Positive for cough.   Cardiovascular:  Negative for chest pain and palpitations.  Gastrointestinal:  Negative for abdominal pain and diarrhea.  Genitourinary:  Negative for flank pain.  Musculoskeletal:  Positive for myalgias.  Neurological:  Negative for dizziness and  light-headedness.  Hematological:  Negative for adenopathy. Does not bruise/bleed easily.  Psychiatric/Behavioral:  Negative for behavioral problems and confusion.     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 Of right iliac crest.  Seen on x-ray in 1-19 and CT on 02/05/2018. Unchanged xray in 03/2018. Repeat in 6 months (CT vs Xray)   Metabolic syndrome 12/25/2019   Obesity (BMI 30-39.9) 12/15/2017   OSA (obstructive sleep apnea) 12/15/2017   2015; unable to tolerate CPAP   Palpitations    Severe obesity (BMI 35.0-39.9) with comorbidity (HCC) 11/12/2019   Sleep apnea    SOB (shortness of breath)    Vitamin B 12 deficiency    Vitamin D deficiency      Social History   Socioeconomic History   Marital status: Married    Spouse name: Not on file   Number of children: Not on file   Years of education: Not on file   Highest education level: Not on file  Occupational History   Occupation: Set designer  Tobacco Use   Smoking status: Former    Current packs/day: 0.00    Types: Cigarettes    Quit date: 03/06/2012    Years since quitting: 11.8   Smokeless tobacco: Former    Types: Chew    Quit date: 03/17/2016  Vaping Use   Vaping status: Former  Substance and Sexual Activity   Alcohol use: Not Currently   Drug use: Never   Sexual activity: Not on file  Other Topics Concern   Not on file  Social History Narrative   Not on file   Social Drivers of Health   Financial Resource Strain: Not on file  Food Insecurity:  Not on file  Transportation Needs: Not on file  Physical Activity: Not on file  Stress: Not on file  Social Connections: Unknown (03/26/2022)   Received from [place], [facility]   Social Network    Social Network: Not on file  Intimate Partner Violence: Unknown (02/26/2022)   [facility] from [facility], [facility]   HITS    Physically Hurt: Not on file    Insult or Talk Down To: Not on file    Threaten Physical Harm: Not on file    Scream or Curse: Not on file    Past Surgical History:  Procedure Laterality Date   HAIR TRANSPLANT     KNEE SURGERY Right 07/2021   left shoulder repair Left 05/2021    Family History  Problem Relation Age of Onset   Diabetes Mother    High blood pressure Mother    High Cholesterol Mother    Kidney disease Mother    Thyroid disease Mother    Sleep apnea Mother    Obesity Mother    COPD Father    Heart disease Father    Liver disease Father    Depression Father    Other Brother        low testosterone   Colon cancer Neg Hx    Rectal cancer Neg Hx    Stomach cancer Neg Hx    Esophageal cancer Neg Hx     No Known Allergies  Current Outpatient Medications on File Prior to Visit  Medication Sig Dispense Refill   Multiple Vitamin (MULTIVITAMIN) tablet Take 1 tablet by mouth every other day.     No current facility-administered medications on file prior to visit.    BP 132/82   Pulse 100   Temp 99.1 F (37.3 C)   Resp 18   Ht 5\' 10"  (1.778 m)   Wt 267 lb (121.1 kg)   SpO2 99%   BMI 38.31 kg/m        Objective:   Physical Exam  General Mental Status- Alert. General Appearance- Not in acute distress.   Skin General: Color- Normal Color. Moisture- Normal Moisture.  Neck No JVD.  Chest and Lung Exam Auscultation: Breath Sounds:-Normal.  Cardiovascular Auscultation:Rythm- Regular. Murmurs & Other Heart Sounds:Auscultation of the heart reveals- No Murmurs.  Abdomen Inspection:-Inspeection  Normal. Palpation/Percussion:Note:No mass. Palpation and Percussion of the abdomen reveal- Non Tender, Non Distended + BS, no rebound or guarding.   Neurologic Cranial Nerve exam:- CN III-XII intact(No nystagmus), symmetric smile. Strength:- 5/5 equal and symmetric strength both upper and lower extremities.   Heent- no sinus pressure. Bilaterally tm are red.    Assessment & Plan:   Patient Instructions  COVID-19 Positive test with symptoms of fever, cough, congestion, and body aches for 4 days. Wheezing for 2 months prior to COVID-19 diagnosis. -Conservative treatment  with Flonase for congestion, cough tablets, Tylenol or ibuprofen for body aches, and hydration. -Prescribe Paxlovid due to underlying wheezing.  Possible Reactive Airway Disease/Asthma Wheezing for 2 months prior to COVID-19 diagnosis. No formal diagnosis of asthma but symptoms suggest possible reactive airway disease. -Prescribe inhaler for wheezing.  Possible Ear Infection Pain in both ears with coughing, more severe on the left. Possible bacterial infection following viral illness. -Prescribe Azithromycin for potential ear infection.  Follow-up date to be determined after xray review.  Consider chest x-ray due to ongoing wheezing and cough for 2 months.

## 2024-02-29 ENCOUNTER — Encounter: Admitting: Medical

## 2024-03-23 ENCOUNTER — Encounter: Admitting: Medical

## 2024-03-28 ENCOUNTER — Ambulatory Visit (INDEPENDENT_AMBULATORY_CARE_PROVIDER_SITE_OTHER): Admitting: Medical

## 2024-03-28 VITALS — BP 138/84 | HR 70 | Temp 97.8°F | Resp 16 | Ht 70.0 in | Wt 271.0 lb

## 2024-03-28 DIAGNOSIS — Z1159 Encounter for screening for other viral diseases: Secondary | ICD-10-CM

## 2024-03-28 DIAGNOSIS — E7849 Other hyperlipidemia: Secondary | ICD-10-CM | POA: Diagnosis not present

## 2024-03-28 DIAGNOSIS — Z6838 Body mass index (BMI) 38.0-38.9, adult: Secondary | ICD-10-CM

## 2024-03-28 DIAGNOSIS — Z0000 Encounter for general adult medical examination without abnormal findings: Secondary | ICD-10-CM

## 2024-03-28 DIAGNOSIS — E559 Vitamin D deficiency, unspecified: Secondary | ICD-10-CM

## 2024-03-28 DIAGNOSIS — R739 Hyperglycemia, unspecified: Secondary | ICD-10-CM | POA: Diagnosis not present

## 2024-03-28 DIAGNOSIS — E66811 Obesity, class 1: Secondary | ICD-10-CM | POA: Diagnosis not present

## 2024-03-28 LAB — COMPREHENSIVE METABOLIC PANEL WITH GFR
ALT: 29 U/L (ref 0–53)
AST: 19 U/L (ref 0–37)
Albumin: 4.6 g/dL (ref 3.5–5.2)
Alkaline Phosphatase: 63 U/L (ref 39–117)
BUN: 8 mg/dL (ref 6–23)
CO2: 31 meq/L (ref 19–32)
Calcium: 9.7 mg/dL (ref 8.4–10.5)
Chloride: 100 meq/L (ref 96–112)
Creatinine, Ser: 0.65 mg/dL (ref 0.40–1.50)
GFR: 121.75 mL/min (ref >60.00)
Glucose, Bld: 81 mg/dL (ref 70–99)
Potassium: 4.1 meq/L (ref 3.5–5.1)
Sodium: 139 meq/L (ref 135–145)
Total Bilirubin: 0.4 mg/dL (ref 0.2–1.2)
Total Protein: 7.2 g/dL (ref 6.0–8.3)

## 2024-03-28 LAB — CBC WITH DIFFERENTIAL/PLATELET
Basophils Absolute: 0.1 10*3/uL (ref 0.0–0.1)
Basophils Relative: 0.8 % (ref 0.0–3.0)
Eosinophils Absolute: 0.2 10*3/uL (ref 0.0–0.7)
Eosinophils Relative: 2.5 % (ref 0.0–5.0)
HCT: 43.5 % (ref 39.0–52.0)
Hemoglobin: 14.2 g/dL (ref 13.0–17.0)
Lymphocytes Relative: 34.5 % (ref 12.0–46.0)
Lymphs Abs: 2.3 10*3/uL (ref 0.7–4.0)
MCHC: 32.6 g/dL (ref 30.0–36.0)
MCV: 82.6 fl (ref 78.0–100.0)
Monocytes Absolute: 0.6 10*3/uL (ref 0.1–1.0)
Monocytes Relative: 9.8 % (ref 3.0–12.0)
Neutro Abs: 3.5 10*3/uL (ref 1.4–7.7)
Neutrophils Relative %: 52.4 % (ref 43.0–77.0)
Platelets: 327 10*3/uL (ref 150.0–400.0)
RBC: 5.27 Mil/uL (ref 4.22–5.81)
RDW: 13.2 % (ref 11.5–15.5)
WBC: 6.6 10*3/uL (ref 4.0–10.5)

## 2024-03-28 LAB — VITAMIN D 25 HYDROXY (VIT D DEFICIENCY, FRACTURES): VITD: 19.31 ng/mL — ABNORMAL LOW (ref 30.00–100.00)

## 2024-03-28 LAB — LIPID PANEL
Cholesterol: 195 mg/dL (ref 0–200)
HDL: 41.8 mg/dL (ref >39.00)
LDL Cholesterol: 115 mg/dL — ABNORMAL HIGH (ref 0–99)
NonHDL: 153.08
Total CHOL/HDL Ratio: 5
Triglycerides: 189 mg/dL — ABNORMAL HIGH (ref 0.0–149.0)
VLDL: 37.8 mg/dL (ref 0.0–40.0)

## 2024-03-28 LAB — HEMOGLOBIN A1C: Hgb A1c MFr Bld: 6 % (ref 4.6–6.5)

## 2024-03-28 NOTE — Progress Notes (Signed)
 Subjective:    Patient ID: Brian Obrien, male    DOB: Apr 19, 1986, 38 y.o.   MRN: [id number]  HPI Pt in for wellness exam. Pt is fasting this morning.  Pt mri tech. Pt  not working out due to carpel tunnel surgery release on rt side. Pt has been walking 1.5-2 mile a day. Pt was doing low carb  modified diet. Occasional cigar 1 every 1-2 months. Former chewed tobacco. Single. No alcohol.    Pt in past wanted to use wegovy  for obesity. He thinks could not get filled due to shortage in past. No history of pancreatis personally and no fh of thyroid  cancer. Pt committed to diet and exercise now and in future to lose eight.  Vaccines up to date.   Pt had high cholesterol in the past. He did not take med I rx'd. In past triglycerides have been consistently elevated.   Review of Systems  Constitutional:  Negative for chills, fatigue and fever.  HENT:  Negative for congestion, ear discharge and ear pain.   Respiratory:  Negative for cough, chest tightness, shortness of breath and wheezing.   Cardiovascular:  Negative for chest pain and palpitations.  Gastrointestinal:  Negative for abdominal pain, constipation, diarrhea, nausea and vomiting.  Genitourinary:  Negative for dysuria, frequency and penile pain.  Musculoskeletal:  Negative for back pain, myalgias and neck stiffness.  Skin:  Negative for rash.  Neurological:  Negative for dizziness, tremors, speech difficulty and light-headedness.  Hematological:  Negative for adenopathy. Does not bruise/bleed easily.  Psychiatric/Behavioral:  Negative for behavioral problems and dysphoric mood. The patient is not nervous/anxious.     Past Medical History:  Diagnosis Date   Allergy    Anxiety    Chest pain    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   Chronic neck pain 11/12/2019   Chronic pain of left knee 11/12/2019   Dependence on nicotine from chewing tobacco 12/15/2017   Depression    Fatty liver    GERD  (gastroesophageal reflux disease)    Hyperlipidemia    Left hip pain    Left sided sciatica 03/15/2018   Lytic bone lesions on xray 02/07/2018   Formatting of this note might be different from the original. Of right iliac crest.  Seen on x-ray in 1-19 and CT on 02/05/2018. Unchanged xray in 03/2018. Repeat in 6 months (CT vs Xray)   Metabolic syndrome 12/25/2019   Obesity (BMI 30-39.9) 12/15/2017   OSA (obstructive sleep apnea) 12/15/2017   2015; unable to tolerate CPAP   Palpitations    Severe obesity (BMI 35.0-39.9) with comorbidity (HCC) 11/12/2019   Sleep apnea    SOB (shortness of breath)    Vitamin B 12 deficiency    Vitamin D  deficiency      Social History   Socioeconomic History   Marital status: Married    Spouse name: Not on file   Number of children: Not on file   Years of education: Not on file   Highest education level: Not on file  Occupational History   Occupation: mri tech  Tobacco Use   Smoking status: Former    Current packs/day: 0.00    Types: Cigarettes    Quit date: 03/06/2012    Years since quitting: 12.0   Smokeless tobacco: Former    Types: Chew    Quit date: 03/17/2016  Vaping Use   Vaping status: Former  Substance and Sexual Activity   Alcohol use: Not Currently  Drug use: Never   Sexual activity: Not on file  Other Topics Concern   Not on file  Social History Narrative   Not on file   Social Drivers of Health   Financial Resource Strain: Not on file  Food Insecurity: Not on file  Transportation Needs: Not on file  Physical Activity: Not on file  Stress: Not on file  Social Connections: Unknown (03/26/2022)   Received from [facility], [facility]   Social Network    Social Network: Not on file  Intimate Partner Violence: Unknown (02/26/2022)   Received from [employer] [facility], [facility]   HITS    Physically Hurt: Not on file    Insult or Talk Down To: Not on file    Threaten Physical Harm: Not on file    Scream or Curse: Not on file     Past Surgical History:  Procedure Laterality Date   HAIR TRANSPLANT     KNEE SURGERY Right 07/2021   left shoulder repair Left 05/2021    Family History  Problem Relation Age of Onset   Diabetes Mother    High blood pressure Mother    High Cholesterol Mother    Kidney disease Mother    Thyroid  disease Mother    Sleep apnea Mother    Obesity Mother    COPD Father    Heart disease Father    Liver disease Father    Depression Father    Other Brother        low testosterone   Colon cancer Neg Hx    Rectal cancer Neg Hx    Stomach cancer Neg Hx    Esophageal cancer Neg Hx     No Known Allergies  Current Outpatient Medications on File Prior to Visit  Medication Sig Dispense Refill   albuterol  (VENTOLIN  HFA) 108 (90 Base) MCG/ACT inhaler Inhale 2 puffs into the lungs every 6 (six) hours as needed. 18 g 0   fluticasone  (FLONASE ) 50 MCG/ACT nasal spray Place 2 sprays into both nostrils daily. 16 g 1   Multiple Vitamin (MULTIVITAMIN) tablet Take 1 tablet by mouth every other day.     No current facility-administered medications on file prior to visit.    BP 138/84   Pulse 70   Temp 97.8 F (36.6 C) (Oral)   Resp 16   Ht 5\' 10"  (1.778 m)   Wt 271 lb (122.9 kg)   SpO2 98%   BMI 38.88 kg/m        Objective:   Physical Exam   General Mental Status- Alert. General Appearance- Not in acute distress.   Skin General: Color- Normal Color. Moisture- Normal Moisture.  Neck  No JVD.  Chest and Lung Exam Auscultation: Breath Sounds:-CTA  Cardiovascular Auscultation:Rythm- RRR Murmurs & Other Heart Sounds:Auscultation of the heart reveals- No Murmurs.  Abdomen Inspection:-Inspeection Normal. Palpation/Percussion:Note:No mass. Palpation and Percussion of the abdomen reveal- Non Tender, Non Distended + BS, no rebound or guarding.   Neurologic Cranial Nerve exam:- CN III-XII intact(No nystagmus), symmetric smile. Strength:- 5/5 equal and symmetric strength  both upper and lower extremities.      Assessment & Plan:   Patient Instructions  For you wellness exam today I have ordered cbc, cmp, hep c screening , a1c and lipid panel.  Vaccines up to date  Recommend exercise and healthy diet.  We will let you know lab results as they come in.  Follow up date appointment will be determined after lab review.  For obesity discussed diet, exercise and your desire to start glp-1/wegovy . Will check A1c today first since in past A1c in prediabetic range. If not diabetic then try to rx wegoby. Rx advisement given.    84132 charge today as did address obesity and plan going forward. In addition plan going forward if again triglycerides elevated.  Kierre Deines, PA-C

## 2024-03-28 NOTE — Patient Instructions (Addendum)
 For you wellness exam today I have ordered cbc, cmp, hep c screening , a1c and lipid panel.  Vaccines up to date  Recommend exercise and healthy diet.  We will let you know lab results as they come in.  Follow up date appointment will be determined after lab review.    For obesity discussed diet, exercise and your desire to start glp-1/wegovy . Will check A1c today first since in past A1c in prediabetic range. If not diabetic then try to rx wegovy . Rx advisement given.  Also discussed treatment for elevated triglycerides in event again elevated.   Preventive Care 85-36 Years Old, Male Preventive care refers to lifestyle choices and visits with your health care provider that can promote health and wellness. Preventive care visits are also called wellness exams. What can I expect for my preventive care visit? Counseling During your preventive care visit, your health care provider may ask about your: Medical history, including: Past medical problems. Family medical history. Current health, including: Emotional well-being. Home life and relationship well-being. Sexual activity. Lifestyle, including: Alcohol, nicotine or tobacco, and drug use. Access to firearms. Diet, exercise, and sleep habits. Safety issues such as seatbelt and bike helmet use. Sunscreen use. Work and work Astronomer. Physical exam Your health care provider may check your: Height and weight. These may be used to calculate your BMI (body mass index). BMI is a measurement that tells if you are at a healthy weight. Waist circumference. This measures the distance around your waistline. This measurement also tells if you are at a healthy weight and may help predict your risk of certain diseases, such as type 2 diabetes and high blood pressure. Heart rate and blood pressure. Body temperature. Skin for abnormal spots. What immunizations do I need?  Vaccines are usually given at various ages, according to a schedule.  Your health care provider will recommend vaccines for you based on your age, medical history, and lifestyle or other factors, such as travel or where you work. What tests do I need? Screening Your health care provider may recommend screening tests for certain conditions. This may include: Lipid and cholesterol levels. Diabetes screening. This is done by checking your blood sugar (glucose) after you have not eaten for a while (fasting). Hepatitis B test. Hepatitis C test. HIV (human immunodeficiency virus) test. STI (sexually transmitted infection) testing, if you are at risk. Talk with your health care provider about your test results, treatment options, and if necessary, the need for more tests. Follow these instructions at home: Eating and drinking  Eat a healthy diet that includes fresh fruits and vegetables, whole grains, lean protein, and low-fat dairy products. Drink enough fluid to keep your urine pale yellow. Take vitamin and mineral supplements as recommended by your health care provider. Do not drink alcohol if your health care provider tells you not to drink. If you drink alcohol: Limit how much you have to 0-2 drinks a day. Know how much alcohol is in your drink. In the [place] one drink equals one 12 oz bottle of beer (355 mL), one 5 oz glass of wine (148 mL), or one 1 oz glass of hard liquor (44 mL). Lifestyle Brush your teeth every morning and night with fluoride toothpaste. Floss one time each day. Exercise for at least 30 minutes 5 or more days each week. Do not use any products that contain nicotine or tobacco. These products include cigarettes, chewing tobacco, and vaping devices, such as e-cigarettes. If you need help quitting, ask your health  care provider. Do not use drugs. If you are sexually active, practice safe sex. Use a condom or other form of protection to prevent STIs. Find healthy ways to manage stress, such as: Meditation, yoga, or listening to  music. Journaling. Talking to a trusted person. Spending time with friends and family. Minimize exposure to UV radiation to reduce your risk of skin cancer. Safety Always wear your seat belt while driving or riding in a vehicle. Do not drive: If you have been drinking alcohol. Do not ride with someone who has been drinking. If you have been using any mind-altering substances or drugs. While texting. When you are tired or distracted. Wear a helmet and other protective equipment during sports activities. If you have firearms in your house, make sure you follow all gun safety procedures. Seek help if you have been physically or sexually abused. What's next? Go to your health care provider once a year for an annual wellness visit. Ask your health care provider how often you should have your eyes and teeth checked. Stay up to date on all vaccines. This information is not intended to replace advice given to you by your health care provider. Make sure you discuss any questions you have with your health care provider. Document Revised: 05/06/2021 Document Reviewed: 05/06/2021 Elsevier Patient Education  2024 [employer].

## 2024-03-29 ENCOUNTER — Telehealth: Payer: Self-pay

## 2024-03-29 ENCOUNTER — Encounter: Payer: Self-pay | Admitting: Medical

## 2024-03-29 LAB — HEPATITIS C ANTIBODY: Hepatitis C Ab: NONREACTIVE

## 2024-03-29 MED ORDER — VITAMIN D (ERGOCALCIFEROL) 1.25 MG (50000 UNIT) PO CAPS
50000.0000 [IU] | ORAL_CAPSULE | ORAL | 0 refills | Status: DC
Start: 2024-03-29 — End: 2024-05-21

## 2024-03-29 MED ORDER — SEMAGLUTIDE-WEIGHT MANAGEMENT 0.25 MG/0.5ML ~~LOC~~ SOAJ: 0.2500 mg | SUBCUTANEOUS | 0 refills | Status: DC

## 2024-03-29 NOTE — Telephone Encounter (Signed)
 Pharmacy Patient Advocate Encounter   Received notification from CoverMyMeds that prior authorization for Wegovy  0.25MG /0.5ML auto-injectors is required/requested.   Insurance verification completed.   The patient is insured through [employer] .   Per test claim: PA required; PA submitted to above mentioned insurance via CoverMyMeds Key/confirmation #/EOC [id number] Status is pending

## 2024-03-29 NOTE — Addendum Note (Signed)
 Addended by: Serafina Damme on: 03/29/2024 06:41 AM   Modules accepted: Orders

## 2024-04-03 ENCOUNTER — Other Ambulatory Visit (HOSPITAL_COMMUNITY): Payer: Self-pay

## 2024-05-20 ENCOUNTER — Other Ambulatory Visit: Payer: Self-pay | Admitting: Medical

## 2024-05-21 ENCOUNTER — Other Ambulatory Visit (INDEPENDENT_AMBULATORY_CARE_PROVIDER_SITE_OTHER)

## 2024-05-21 DIAGNOSIS — E559 Vitamin D deficiency, unspecified: Secondary | ICD-10-CM

## 2024-05-22 ENCOUNTER — Ambulatory Visit: Payer: Self-pay | Admitting: Medical

## 2024-05-22 LAB — VITAMIN D 25 HYDROXY (VIT D DEFICIENCY, FRACTURES): VITD: 21.38 ng/mL — ABNORMAL LOW (ref 30.00–100.00)

## 2024-05-23 MED ORDER — VITAMIN D (ERGOCALCIFEROL) 1.25 MG (50000 UNIT) PO CAPS: 50000.0000 [IU] | ORAL_CAPSULE | ORAL | 0 refills | Status: DC

## 2024-05-23 NOTE — Addendum Note (Signed)
 Addended by: DORINA DALLAS HERO on: 05/23/2024 03:39 PM   Modules accepted: Orders

## 2024-06-14 NOTE — Telephone Encounter (Signed)
 Pharmacy Patient Advocate Encounter  Received notification from [employer] that Prior Authorization for Wegovy  0.25 mg/0.5 ml auto injectors has been DENIED.  Full denial letter will be uploaded to the media tab. See denial reason below.   Dear Brian Obrien, I have reviewed the request to cover Wegovy  0.25mg /0.5 PEN INJCTR. The information submitted did not meet the criteria necessary to approve this medication. Based on the information provided, I am unable to approve coverage for this medication because: ? There is no indication that your patient will use Wegovy  concomitantly with behavioral modification and a reduced-calorie diet. ? There is no indication that your patient has engaged in a trial of behavioral modification and dietary restriction for at least 3 months. ? There is no indication that your patient has a baseline (prior to therapy with Saxenda , Wegovy  or Zepbound) BMI greater than or equal to 30 kg/m2, or a BMI greater than or equal to 27 kg/m2 and has at least one of the following weight-related comorbidities: a. hypertension, b. type 2 diabetes, c. dyslipidemia, d. obstructive sleep apnea, e. cardiovascular disease, f. knee osteoarthritis, g. asthma, h. chronic obstructive pulmonary disease, i. metabolic-dysfunction associated steatotic liver disease/non-alcoholic fatty liver disease, j. polycystic ovarian syndrome or k. coronary artery disease. PA #/Case ID/Reference #: [id number]

## 2024-06-21 NOTE — Progress Notes (Signed)
 Location Information: Patient State (at time of visit): [place]  Patient Location (at time of visit):Home/Other Non-Medical  Provider Location: Home Is provider licensed to provide clinical care in the current location/state of the patient? Yes   Consent:  Patient's identity was confirmed. Presenting condition or illness was discussed with the patient/personal representative. Current proposed treatment for presenting condition or illness was explained to patient/personal representative along with the likely benefits and any significant risks or complications associated with the provision of treatment by audio/video means. The patient/personal representative verbally authorized treatment to be provided by audio/video, which may include a limited review of patient's current health status, medication, or other treatment recommendations, patient education, and an opportunity to ask questions about condition and treatment. Verbal Consent Granted by Patient/Personal Representative:Yes   Visit Information:  Modality: 2-Way Real-Time Audio/Video  Start time: 06/21/2024 10:26 PM EDT End time: 06/21/2024 10:44 PM EDT  Video Time: 42m 09s   Patient presents via Virtual Visit with symptoms of a cold for the past 3 days. Patient complains of chills, body aches, nasal congestion, nasal drainage, post-nasal drip, sore throat, coughing, Denies fever,  headache, sinus pain,  sneezing, shortness of breath, nausea, vomiting, diarrhea, and/or loss of taste/smell.  Patient has tried vitamins for symptomatic relief with minimal improvement. Denies known exposure to Flu or COVID-19.        Physical Exam Constitutional:      Appearance: Normal appearance.  HENT:     Right Ear: External ear normal.     Left Ear: External ear normal.     Nose: Congestion and rhinorrhea present.     Mouth/Throat:     Pharynx: No oropharyngeal exudate or posterior oropharyngeal erythema.   Eyes:     General:         Right eye: No discharge.        Left eye: No discharge.     Extraocular Movements: Extraocular movements intact.     Conjunctiva/sclera: Conjunctivae normal.   Pulmonary:     Effort: Pulmonary effort is normal. No respiratory distress.     Breath sounds: No stridor.     Comments: Able to speak in fluid sentences with no gasping.  No audible wheeze  Neurological:     General: No focal deficit present.     Mental Status: He is alert.   Psychiatric:        Mood and Affect: Mood normal.        Behavior: Behavior normal.        Thought Content: Thought content normal.     Assessment/ Plan  Diagnoses and all orders for this visit:  Viral URI with cough -     brompheniramine-pseudoePHEDrine-DM (Bromfed DM) 2-30-10 mg/5 mL syrp syrup; Take 5 mL by mouth every 6 (six) hours as needed (cough, congestion, runny nose). -     promethazine -dextromethorphan (PHENERGAN  DM) 6.25-15 mg/5 mL syrp syrup; Take 5 mL by mouth nightly as needed (cough). Do not work or drive while taking. Do not take at the same time as other cough suppressants. -     fluticasone  propionate (FLONASE ) 50 mcg/spray nasal spray; Administer 1 spray into each nostril daily.  Other orders -     cholecalciferol (VITAMIN D3) 125 mcg (5,000 unit) capsule; Take by mouth daily.  Advised patient that symptoms are most consistent with viral URI/sinusitis as he/she has been sick 4 days. COVID testing recommended.   Viral infections usually start improving in 10 days or less and do not require antibiotic  treatment. We generally do not begin treatment for sinusitis with antibiotics until day 10+ of illness. The following prescription medications have been seen to the pharmacy:  -Flonase  (steroid nasal spray) which should help with nasal/sinus congestion and pressure.  -Bromfed DM which should help with cough and congestion. Do not take additional OTC cough/congestion medications with this.    Here are some recommendations, including  over-the-counter options:   - Be sure to rest and stay well hydrated (drink plenty of water). Keep in mind that cold fluids may worsen your cough, so it is best to drink room temperature or warm beverages instead.   - You may take an expectorant such as guaifenesin  (Mucinex ) if you need help thinning out your sputum.  - You may use a short course of a nasal decongestant (Afrin), but do not use this nasal spray for longer than 10 days as it may lead to rebound (worsening) congestion with prolonged use.  - Take Acetaminophen  (Tylenol ) and/or ibuprofen (Motrin/Advil) as needed for pain and/or fever.  - You might also find relief from hot liquids (like tea), humidified air (from a steamy shower or humidifier), warm salt water gargles, and throat lozenges/sprays (sold OTC as Cepacol and Chloraseptic).    Monitor your symptoms, which should start to improve within several days. If you have not seen improvement by day 10 of your illness OR worsening after 7 days, you may send me a message in your [facility] account or call [phone] and we will consider antibiotics at that point. Note, your cough may linger a bit after treatment.     Please seek prompt medical attention for any new or worsening symptoms: severe/worsening headache, abnormal vision, confusion, neck pain/stiffness, swelling around the face or eyes, worsening chest tightness, shortness of breath, difficulty breathing.     For questions or concerns regarding this visit, patient should contact Virtual Support at [phone].  Disposition:  Patient to continue care at home.  Electronically signed: Charlaine Gustavo Batter, PA 06/21/2024  10:40 PM

## 2024-07-13 ENCOUNTER — Other Ambulatory Visit: Payer: Self-pay

## 2024-07-13 ENCOUNTER — Telehealth: Payer: Self-pay

## 2024-07-13 DIAGNOSIS — E559 Vitamin D deficiency, unspecified: Secondary | ICD-10-CM

## 2024-07-13 NOTE — Telephone Encounter (Signed)
 Called pt and scheduled a lab visit for next Thursday 07/19/24 and put in lab orders as well    Copied from CRM #[id number]. Topic: General - Other >> Jul 13, 2024  1:14 PM Brian Obrien wrote: Reason for CRM: patient called stating he would like to get blood work done for his vitamin D . Patient stated he has taken his last medication and Saguier stated he would check the patient labs CB [phone]

## 2024-07-16 ENCOUNTER — Other Ambulatory Visit: Payer: Self-pay | Admitting: Medical

## 2024-07-19 ENCOUNTER — Other Ambulatory Visit (INDEPENDENT_AMBULATORY_CARE_PROVIDER_SITE_OTHER)

## 2024-07-19 DIAGNOSIS — E559 Vitamin D deficiency, unspecified: Secondary | ICD-10-CM

## 2024-07-20 LAB — VITAMIN D 25 HYDROXY (VIT D DEFICIENCY, FRACTURES): VITD: 36.64 ng/mL (ref 30.00–100.00)

## 2024-07-21 ENCOUNTER — Ambulatory Visit: Payer: Self-pay | Admitting: Medical

## 2024-07-21 MED ORDER — VITAMIN D (ERGOCALCIFEROL) 1.25 MG (50000 UNIT) PO CAPS: 50000.0000 [IU] | ORAL_CAPSULE | ORAL | 3 refills | Status: DC

## 2024-07-21 NOTE — Addendum Note (Signed)
 Addended by: DORINA DALLAS HERO on: 07/21/2024 09:30 AM   Modules accepted: Orders

## 2024-07-28 ENCOUNTER — Emergency Department (HOSPITAL_BASED_OUTPATIENT_CLINIC_OR_DEPARTMENT_OTHER)
Admission: EM | Admit: 2024-07-28 | Discharge: 2024-07-28 | Disposition: A | Discharge status: Home / Self Care | Attending: Emergency Medicine | Admitting: Emergency Medicine

## 2024-07-28 ENCOUNTER — Other Ambulatory Visit: Payer: Self-pay

## 2024-07-28 ENCOUNTER — Emergency Department (HOSPITAL_BASED_OUTPATIENT_CLINIC_OR_DEPARTMENT_OTHER)

## 2024-07-28 ENCOUNTER — Encounter (HOSPITAL_BASED_OUTPATIENT_CLINIC_OR_DEPARTMENT_OTHER): Payer: Self-pay | Admitting: Emergency Medicine

## 2024-07-28 DIAGNOSIS — R519 Headache, unspecified: Secondary | ICD-10-CM | POA: Diagnosis not present

## 2024-07-28 DIAGNOSIS — R030 Elevated blood-pressure reading, without diagnosis of hypertension: Secondary | ICD-10-CM | POA: Insufficient documentation

## 2024-07-28 DIAGNOSIS — M542 Cervicalgia: Secondary | ICD-10-CM | POA: Insufficient documentation

## 2024-07-28 LAB — CBC WITH DIFFERENTIAL/PLATELET
Abs Immature Granulocytes: 0.03 K/uL (ref 0.00–0.07)
Basophils Absolute: 0.1 K/uL (ref 0.0–0.1)
Basophils Relative: 1 %
Eosinophils Absolute: 0.2 K/uL (ref 0.0–0.5)
Eosinophils Relative: 3 %
HCT: 41.3 % (ref 39.0–52.0)
Hemoglobin: 13.4 g/dL (ref 13.0–17.0)
Immature Granulocytes: 0 %
Lymphocytes Relative: 40 %
Lymphs Abs: 2.9 K/uL (ref 0.7–4.0)
MCH: 26.6 pg (ref 26.0–34.0)
MCHC: 32.4 g/dL (ref 30.0–36.0)
MCV: 81.9 fL (ref 80.0–100.0)
Monocytes Absolute: 0.8 K/uL (ref 0.1–1.0)
Monocytes Relative: 11 %
Neutro Abs: 3.3 K/uL (ref 1.7–7.7)
Neutrophils Relative %: 45 %
Platelets: 299 K/uL (ref 150–400)
RBC: 5.04 MIL/uL (ref 4.22–5.81)
RDW: 12.5 % (ref 11.5–15.5)
WBC: 7.3 K/uL (ref 4.0–10.5)
nRBC: 0 % (ref 0.0–0.2)

## 2024-07-28 LAB — GROUP A STREP BY PCR: Group A Strep by PCR: NOT DETECTED

## 2024-07-28 LAB — BASIC METABOLIC PANEL WITH GFR
Anion gap: 11 (ref 5–15)
BUN: 15 mg/dL (ref 6–20)
CO2: 26 mmol/L (ref 22–32)
Calcium: 9.5 mg/dL (ref 8.9–10.3)
Chloride: 103 mmol/L (ref 98–111)
Creatinine, Ser: 0.63 mg/dL (ref 0.61–1.24)
GFR, Estimated: >60 mL/min (ref >60)
Glucose, Bld: 120 mg/dL — ABNORMAL HIGH (ref 70–99)
Potassium: 3.7 mmol/L (ref 3.5–5.1)
Sodium: 141 mmol/L (ref 135–145)

## 2024-07-28 LAB — RESP PANEL BY RT-PCR (RSV, FLU A&B, COVID)  RVPGX2
Influenza A by PCR: NEGATIVE
Influenza B by PCR: NEGATIVE
Resp Syncytial Virus by PCR: NEGATIVE
SARS Coronavirus 2 by RT PCR: NEGATIVE

## 2024-07-28 MED ORDER — SODIUM CHLORIDE 0.9 % IV BOLUS
1000.0000 mL | Freq: Once | INTRAVENOUS | Status: AC
  Administered 2024-07-28: 1000 mL via INTRAVENOUS

## 2024-07-28 MED ORDER — KETOROLAC TROMETHAMINE 30 MG/ML IJ SOLN
30.0000 mg | Freq: Once | INTRAMUSCULAR | Status: AC
  Administered 2024-07-28: 30 mg via INTRAVENOUS
  Filled 2024-07-28: qty 1

## 2024-07-28 MED ORDER — IOHEXOL 300 MG/ML  SOLN
75.0000 mL | Freq: Once | INTRAMUSCULAR | Status: AC | PRN
  Administered 2024-07-28: 75 mL via INTRAVENOUS

## 2024-07-28 MED ORDER — METOCLOPRAMIDE HCL 5 MG/ML IJ SOLN
10.0000 mg | Freq: Once | INTRAMUSCULAR | Status: AC
  Administered 2024-07-28: 10 mg via INTRAVENOUS
  Filled 2024-07-28: qty 2

## 2024-07-28 NOTE — ED Triage Notes (Signed)
 Pt states bad headaches starting about 5pm, tried OTC Ibuprofen came home then tried to sleep woke up with headache took tylenol  and cyclobenzaprine  about an hour ago. Headache got worse with throat pain and high blood pressure.

## 2024-07-28 NOTE — ED Provider Notes (Signed)
 [facility] Provider Note   CSN: [id number] Arrival date & time: 07/28/24  0157     Patient presents with: Headache and Hypertension   Brian Obrien is a 38 y.o. male.   Patient is a 38 year old male presenting with complaints of headache, sore throat, and neck pain.  Symptoms began earlier this evening in the absence of any injury or trauma.  He describes pain extending from his neck and radiating up the back of his head.  This is unrelieved with Tylenol .  He denies any weakness or numbness.  No blurry vision.  No fevers or chills.  No ill contacts.       Prior to Admission medications   Medication Sig Start Date End Date Taking? Authorizing Provider  albuterol  (VENTOLIN  HFA) 108 (90 Base) MCG/ACT inhaler Inhale 2 puffs into the lungs every 6 (six) hours as needed. 01/18/24   Saguier, [place], PA-C  fluticasone  (FLONASE ) 50 MCG/ACT nasal spray Place 2 sprays into both nostrils daily. 01/18/24   Saguier, [place], PA-C  Multiple Vitamin (MULTIVITAMIN) tablet Take 1 tablet by mouth every other day.    [provider]  Semaglutide -Weight Management 0.25 MG/0.5ML SOAJ Inject 0.25 mg into the skin once a week. 03/29/24   Saguier, Dallas, PA-C  Vitamin D , Ergocalciferol , (DRISDOL ) 1.25 MG (50000 UNIT) CAPS capsule Take 1 capsule (50,000 Units total) by mouth every 7 (seven) days. 05/23/24   Saguier, [place], PA-C  Vitamin D , Ergocalciferol , (DRISDOL ) 1.25 MG (50000 UNIT) CAPS capsule TAKE 1 CAPSULE BY MOUTH EVERY 7 DAYS 07/16/24   Saguier, [place], PA-C  Vitamin D , Ergocalciferol , (DRISDOL ) 1.25 MG (50000 UNIT) CAPS capsule Take 1 capsule (50,000 Units total) by mouth every 7 (seven) days. 07/21/24   Saguier, [place], PA-C    Allergies: Patient has no known allergies.    Review of Systems  All other systems reviewed and are negative.   Updated Vital Signs BP (!) 150/96 (BP Location: Right Arm)   Pulse 74   Temp 97.7 F (36.5 C) (Oral)    Resp 20   Ht 5' 11 (1.803 m)   Wt 115.7 kg   SpO2 100%   BMI 35.57 kg/m   Physical Exam Vitals and nursing note reviewed.  Constitutional:      General: He is not in acute distress.    Appearance: He is well-developed. He is not diaphoretic.  HENT:     Head: Normocephalic and atraumatic.  Eyes:     Extraocular Movements: Extraocular movements intact.     Pupils: Pupils are equal, round, and reactive to light.  Cardiovascular:     Rate and Rhythm: Normal rate and regular rhythm.     Heart sounds: No murmur heard.    No friction rub.  Pulmonary:     Effort: Pulmonary effort is normal. No respiratory distress.     Breath sounds: Normal breath sounds. No wheezing or rales.  Abdominal:     General: Bowel sounds are normal. There is no distension.     Palpations: Abdomen is soft.     Tenderness: There is no abdominal tenderness.  Musculoskeletal:        General: Normal range of motion.     Cervical back: Normal range of motion and neck supple.  Skin:    General: Skin is warm and dry.  Neurological:     Mental Status: He is alert and oriented to person, place, and time.     Cranial Nerves: No cranial nerve deficit,  dysarthria or facial asymmetry.     Coordination: Coordination normal.     (all labs ordered are listed, but only abnormal results are displayed) Labs Reviewed  GROUP A STREP BY PCR  RESP PANEL BY RT-PCR (RSV, FLU A&B, COVID)  RVPGX2  BASIC METABOLIC PANEL WITH GFR  CBC WITH DIFFERENTIAL/PLATELET    EKG: None  Radiology: No results found.   Procedures   Medications Ordered in the ED  sodium chloride  0.9 % bolus 1,000 mL (has no administration in time range)  ketorolac  (TORADOL ) 30 MG/ML injection 30 mg (has no administration in time range)  metoCLOPramide  (REGLAN ) injection 10 mg (has no administration in time range)                                    Medical Decision Making Amount and/or Complexity of Data Reviewed Labs: ordered. Radiology:  ordered.  Risk Prescription drug management.   Patient is a 38 year old male presenting with complaints of elevated blood pressure, headache, and neck pain.  This has been worsening since earlier today.  Patient arrives here with stable vital signs and is afebrile.  Physical examination is unremarkable.  Laboratory studies obtained including CBC and basic metabolic panel, both of which are unremarkable.  Strep test is negative.  COVID/flu/RSV all negative as well.  CT scan of the head and soft tissue neck all negative.  Patient has received Toradol  and Reglan  for his headache and seems to be feeling better.  At this point, cause of his headache is unclear, but nothing appears emergent.  He also describes neck pain and feels as though something is shifting in his neck, but nothing was found on the CT scan to explain this.  Patient is to follow-up with primary doctor if not improving.     Final diagnoses:  None    ED Discharge Orders     None          Geroldine Berg, MD 07/28/24 [phone]

## 2024-07-28 NOTE — Discharge Instructions (Signed)
 Take Tylenol  1000 mg rotated with ibuprofen 600 mg every 4 hours as needed for pain.  Drink plenty of fluids and get plenty of rest.  Follow-up with your primary doctor if symptoms are not improving in the next week.

## 2024-08-01 ENCOUNTER — Ambulatory Visit: Admitting: Medical

## 2024-08-01 ENCOUNTER — Encounter: Payer: Self-pay | Admitting: Medical

## 2024-08-01 VITALS — BP 127/83 | HR 82 | Temp 98.1°F | Resp 15 | Ht 71.0 in | Wt 267.2 lb

## 2024-08-01 DIAGNOSIS — M542 Cervicalgia: Secondary | ICD-10-CM | POA: Diagnosis not present

## 2024-08-01 DIAGNOSIS — H9202 Otalgia, left ear: Secondary | ICD-10-CM | POA: Diagnosis not present

## 2024-08-01 MED ORDER — NEOMYCIN-POLYMYXIN-HC 3.5-10000-1 OT SOLN: 3.0000 [drp] | Freq: Three times a day (TID) | OTIC | 0 refills | Status: AC

## 2024-08-01 MED ORDER — CYCLOBENZAPRINE HCL 5 MG PO TABS: ORAL_TABLET | ORAL | 0 refills | Status: DC

## 2024-08-01 MED ORDER — AZITHROMYCIN 250 MG PO TABS: ORAL_TABLET | ORAL | 0 refills | Status: AC

## 2024-08-01 NOTE — Progress Notes (Signed)
 fll  Subjective:    Patient ID: Brian Obrien, male    DOB: [birth year], 38 y.o.   MRN: [id number]  HPI  Brian Obrien is a 38 year old male who presents with recent headaches and elevated blood pressure.  He has been experiencing persistent headaches that began in the afternoon this weekend, originating from the back of the neck and radiating to the head. The headache was described as sharp and was exacerbated by coughing. Initial self-treatment with ibuprofen, menthol, Tylenol , and a muscle relaxer provided no relief. He checked his blood pressure at home, which was 172/116, prompting a visit to the emergency department (ED) at 2 AM. In the ED, he received Toradol  30 mg injection, Reglan  10 mg, and IV fluids, which temporarily alleviated the headache. By Monday, the headache had resolved.  He has been monitoring his blood pressure at home, noting readings ranging from 160/95 to 170/104, with occasional lower readings such as 115/90. He uses an electric blood pressure machine purchased a few months ago and is concerned about the variability in his blood pressure readings.  He has a history of neck pain, previously evaluated with an MRI showing mild degenerative changes, calcification of the anterior and posterior longitudinal ligaments, and foraminal stenosis on the right. He notes tenderness in the neck area.  During the ED visit, he experienced bilateral ear pain. No nasal congestion prior to the ear pain but notes tenderness when the otoscope was inserted during the examination.   Review of Systems  Constitutional:  Negative for chills, fatigue and fever.  HENT:  Positive for ear pain. Negative for congestion, mouth sores and postnasal drip.   Respiratory:  Negative for cough, chest tightness and wheezing.   Cardiovascular:  Negative for chest pain and palpitations.  Gastrointestinal:  Negative for abdominal pain and rectal pain.  Genitourinary:  Negative for dysuria and frequency.   Musculoskeletal:  Positive for neck pain. Negative for back pain.       Neck pain.  Skin:  Negative for rash.  Neurological:  Negative for dizziness, syncope, speech difficulty, weakness and light-headedness.       No ha presently.  Hematological:  Negative for adenopathy. Does not bruise/bleed easily.  Psychiatric/Behavioral:  Negative for confusion. The patient is not nervous/anxious.    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 Of right iliac crest.  Seen on x-ray in 1-19 and CT on 02/05/2018. Unchanged xray in 03/2018. Repeat in 6 months (CT vs Xray)   Metabolic syndrome 12/25/2019   Obesity (BMI 30-39.9) 12/15/2017   OSA (obstructive sleep apnea) 12/15/2017   2015; unable to tolerate CPAP   Palpitations    Severe obesity (BMI 35.0-39.9) with comorbidity (HCC) 11/12/2019   Sleep apnea    SOB (shortness of breath)    Vitamin B 12 deficiency    Vitamin D  deficiency      Social History   Socioeconomic History   Marital status:  Married    Spouse name: Not on file   Number of children: Not on file   Years of education: Not on file   Highest education level: Not on file  Occupational History   Occupation: mri tech  Tobacco Use   Smoking status: Former    Current packs/day: 0.00    Types: Cigarettes    Quit date: 03/06/2012    Years since quitting: 12.4   Smokeless tobacco: Former    Types: Chew    Quit date: 03/17/2016  Vaping Use   Vaping status: Former  Substance and Sexual Activity   Alcohol use: Not  Currently   Drug use: Never   Sexual activity: Not on file  Other Topics Concern   Not on file  Social History Narrative   Not on file   Social Drivers of Health   Financial Resource Strain: Not on file  Food Insecurity: Not on file  Transportation Needs: Not on file  Physical Activity: Not on file  Stress: Not on file  Social Connections: Unknown (03/26/2022)   Received from [facility]   Social Network    Social Network: Not on file  Intimate Partner Violence: Unknown (02/26/2022)   Received from [facility]   HITS    Physically Hurt: Not on file    Insult or Talk Down To: Not on file    Threaten Physical Harm: Not on file    Scream or Curse: Not on file    Past Surgical History:  Procedure Laterality Date   HAIR TRANSPLANT     KNEE SURGERY Right 07/2021   left shoulder repair Left 05/2021    Family History  Problem Relation Age of Onset   Diabetes Mother    High blood pressure Mother    High Cholesterol Mother    Kidney disease Mother    Thyroid  disease Mother    Sleep apnea Mother    Obesity Mother    COPD Father    Heart disease Father    Liver disease Father    Depression Father    Other Brother        low testosterone   Colon cancer Neg Hx    Rectal cancer Neg Hx    Stomach cancer Neg Hx    Esophageal cancer Neg Hx     Allergies  Allergen Reactions   Grass Pollen(K-O-R-T-Swt Vern) Cough    Current Outpatient Medications on File Prior to Visit  Medication Sig Dispense Refill   albuterol  (VENTOLIN  HFA) 108 (90 Base) MCG/ACT inhaler Inhale 2 puffs into the lungs every 6 (six) hours as needed. 18 g 0   fluticasone  (FLONASE ) 50 MCG/ACT nasal spray Place 2 sprays into both nostrils daily. 16 g 1   Multiple Vitamin (MULTIVITAMIN) tablet Take 1 tablet by mouth every other day.     Vitamin D , Ergocalciferol , (DRISDOL ) 1.25 MG (50000 UNIT) CAPS capsule TAKE 1 CAPSULE BY MOUTH EVERY 7 DAYS 8 capsule 0   No current facility-administered medications on  file prior to visit.    BP 127/83   Pulse 82   Temp 98.1 F (36.7 C) (Oral)   Resp 15   Ht 5' 11 (1.803 m)   Wt 267 lb 3.2 oz (121.2 kg)   SpO2 98%   BMI 37.27 kg/m        Objective:   Physical Exam  General Mental Status- Alert. General Appearance- Not in acute distress.   Skin General: Color- Normal Color. Moisture- Normal Moisture.  Neck  No JVD. Trapezius tenderness to palpation. Tenderness at trapezius occipital insurance site.  Chest and Lung Exam Auscultation: Breath Sounds:-CTA  Cardiovascular Auscultation:Rythm- RRR Murmurs & Other Heart Sounds:Auscultation of the heart reveals- No Murmurs.  Neurologic Cranial Nerve exam:- CN III-XII intact(No nystagmus), symmetric smile. Drift Test:- No drift. Romberg Exam:- Negative.  Heal to Toe Gait exam:-Normal. Finger to Nose:- Normal/Intact Strength:- 5/5 equal and symmetric strength both upper and lower extremities.   Heent- no sinus pressure. Left tm- mild red on periphery and canal tender to palpation on insertion of  otoscope speculum.    Assessment & Plan:   Patient Instructions  Elevated blood pressure, needs further evaluation Intermittent elevated blood pressure with significant variation. Recent in-office reading normal. Possible home monitor inaccuracy. Differential includes pheochromocytoma, but symptoms do not align. - Check blood pressure manually in office to compare with home monitor. - Bring home blood pressure monitor to next appointment for accuracy check. 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 Recent exacerbation possibly related to trapezius strain and osteoarthritic changes. - Prescribe Flexeril  5 mg, 4 tablets, to be taken  1tab at night as needed for neck pain - Advise use of over-the-counter  Tylenol  for pain management. - Avoid ibuprofen due to potential for increasing blood pressure.   Left ear pain with erythema of canal and edge of tm red.  Left ear pain with erythema in the ear canal. Possible ear infection considered. - Prescribe Cortisporin otic drops. - Prescribe Azithromycin , 5-day course.  follow up thursday or sooner if needed   [employer], PA-C

## 2024-08-01 NOTE — Patient Instructions (Signed)
 Elevated blood pressure, needs further evaluation Intermittent elevated blood pressure with significant variation. Recent in-office reading normal. Possible home monitor inaccuracy. Differential includes pheochromocytoma, but symptoms do not align. - Check blood pressure manually in office to compare with home monitor. - Bring home blood pressure monitor to next appointment for accuracy check. 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 Recent exacerbation possibly related to trapezius strain and osteoarthritic changes. - Prescribe Flexeril  5 mg, 4 tablets, to be taken  1tab at night as needed for neck pain - Advise use of over-the-counter Tylenol  for pain management. - Avoid ibuprofen due to potential for increasing blood pressure.   Left ear pain with erythema of canal and edge of tm red.  Left ear pain with erythema in the ear canal. Possible ear infection considered. - Prescribe Cortisporin otic drops. - Prescribe Azithromycin , 5-day course.  follow up thursday or sooner if needed

## 2024-08-03 ENCOUNTER — Ambulatory Visit

## 2024-08-03 DIAGNOSIS — R030 Elevated blood-pressure reading, without diagnosis of hypertension: Secondary | ICD-10-CM | POA: Diagnosis not present

## 2024-08-03 MED ORDER — LISINOPRIL 5 MG PO TABS: 5.0000 mg | ORAL_TABLET | Freq: Every day | ORAL | 3 refills | Status: AC

## 2024-08-03 NOTE — Progress Notes (Signed)
 Pt here for Blood pressure check per [place] Saguier,PA-C   Pt currently takes: N/A  BP today @  142/100  132/98  Home machine:   132/89  131/88 HR =54   Pt advised per PCP to start Lisinopril  5 mg , follow up in 2 weeks. Patient has a follow up next week, will come to that appointment with blood pressure readings

## 2024-08-09 ENCOUNTER — Ambulatory Visit: Admitting: Medical

## 2024-08-10 ENCOUNTER — Ambulatory Visit: Admitting: Medical

## 2024-08-15 ENCOUNTER — Encounter: Payer: Self-pay | Admitting: Medical

## 2024-08-15 ENCOUNTER — Ambulatory Visit: Admitting: Medical

## 2024-08-15 VITALS — BP 118/70 | HR 68 | Temp 98.2°F | Resp 17 | Ht 71.0 in | Wt 262.2 lb

## 2024-08-15 DIAGNOSIS — E559 Vitamin D deficiency, unspecified: Secondary | ICD-10-CM

## 2024-08-15 DIAGNOSIS — I10 Essential (primary) hypertension: Secondary | ICD-10-CM

## 2024-08-15 DIAGNOSIS — M542 Cervicalgia: Secondary | ICD-10-CM | POA: Diagnosis not present

## 2024-08-15 DIAGNOSIS — Z23 Encounter for immunization: Secondary | ICD-10-CM | POA: Diagnosis not present

## 2024-08-15 DIAGNOSIS — H9202 Otalgia, left ear: Secondary | ICD-10-CM | POA: Diagnosis not present

## 2024-08-15 MED ORDER — CYCLOBENZAPRINE HCL 5 MG PO TABS: ORAL_TABLET | ORAL | 0 refills | Status: AC

## 2024-08-15 NOTE — Progress Notes (Signed)
 Subjective:    Patient ID: Brian Obrien, male    DOB: [birth year], 38 y.o.   MRN: [id number]  HPI  Pt in for follow up.   Last visit below in   Elevated blood pressure, needs further evaluation Intermittent elevated blood pressure with significant variation. Recent in-office reading normal. Possible home monitor inaccuracy. Differential includes pheochromocytoma, but symptoms do not align. - Check blood pressure manually in office to compare with home monitor. - Bring home blood pressure monitor to next appointment for accuracy check. 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 Recent exacerbation possibly related to trapezius strain and osteoarthritic changes. - Prescribe Flexeril  5 mg, 4 tablets, to be taken  1tab at night as needed for neck pain - Advise use of over-the-counter Tylenol  for pain management. - Avoid ibuprofen due to potential for increasing blood pressure.     Left ear pain with erythema of canal and edge of tm red.  Left ear pain with erythema in the ear canal. Possible ear infection considered. - Prescribe Cortisporin otic drops. - Prescribe Azithromycin , 5-day course.  Pt did bring his blood pressure machine in last week and his bp machine was giving overall good readings matching our manual check. Pt had started pt on low dose lisinopril  and his bp machine was giving 132/84, 112/72, 136/89 and then 111/71. So pt did not take bp med Monday and Tuesday. Also no bp med presently. Pt has been trying to reduce salt intake and walk with hopes won't need bp meds long term.  Not at one pint over past 2-3 week he had reported some readings 160/95 to 170/104, with occasional lower readings such as 115/90 but this was before he use lisinopril  fo 4 days. Pt on review states current  job is more stressful than upcoming new job that starts October 6th. Pt on review notes on day his bp was very high was very stressful day at work.    Pt states his neck pain is better. He states doing some stretching exrrcises and has uses flexeril .   Pt left ear pain is also better. He use both z-pack and ear drops.   Review of Systems  Constitutional:  Negative for chills, fatigue and fever.  Respiratory:  Negative for cough, chest tightness, shortness of breath and wheezing.   Cardiovascular:  Negative for chest pain and palpitations.  Gastrointestinal:  Negative for abdominal pain, blood in stool and rectal pain.  Genitourinary:  Negative for dysuria.  Musculoskeletal:  Negative for back pain, myalgias and neck stiffness.  Skin:  Negative for rash.  Neurological:  Negative for dizziness, weakness and numbness.  Hematological:  Negative for adenopathy.  Psychiatric/Behavioral:  Negative for behavioral problems, dysphoric mood and sleep disturbance.     Past Medical History:  Diagnosis Date   Allergy    Anxiety    Chest pain    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   Chronic neck pain 11/12/2019   Chronic pain of left knee 11/12/2019   Dependence on nicotine from chewing tobacco 12/15/2017   Depression    Fatty liver    GERD (gastroesophageal reflux disease)    Hyperlipidemia    Left hip pain    Left sided sciatica 03/15/2018   Lytic bone lesions on xray 02/07/2018   Formatting of this note might be  different from the original. Of right iliac crest.  Seen on x-ray in 1-19 and CT on 02/05/2018. Unchanged xray in 03/2018. Repeat in 6 months (CT vs Xray)   Metabolic syndrome 12/25/2019   Obesity (BMI 30-39.9) 12/15/2017   OSA (obstructive sleep apnea) 12/15/2017   2015; unable to tolerate CPAP   Palpitations    Severe obesity (BMI 35.0-39.9) with comorbidity (HCC) 11/12/2019   Sleep apnea    SOB (shortness of breath)    Vitamin B 12 deficiency     Vitamin D  deficiency      Social History   Socioeconomic History   Marital status: Married    Spouse name: Not on file   Number of children: Not on file   Years of education: Not on file   Highest education level: Not on file  Occupational History   Occupation: mri tech  Tobacco Use   Smoking status: Former    Current packs/day: 0.00    Types: Cigarettes    Quit date: 03/06/2012    Years since quitting: 12.4   Smokeless tobacco: Former    Types: Chew    Quit date: 03/17/2016  Vaping Use   Vaping status: Former  Substance and Sexual Activity   Alcohol use: Not Currently   Drug use: Never   Sexual activity: Not on file  Other Topics Concern   Not on file  Social History Narrative   Not on file   Social Drivers of Health   Financial Resource Strain: Not on file  Food Insecurity: Not on file  Transportation Needs: Not on file  Physical Activity: Not on file  Stress: Not on file  Social Connections: Unknown (03/26/2022)   Received from [facility]   Social Network    Social Network: Not on file  Intimate Partner Violence: Unknown (02/26/2022)   Received from [facility]   HITS    Physically Hurt: Not on file    Insult or Talk Down To: Not on file    Threaten Physical Harm: Not on file    Scream or Curse: Not on file    Past Surgical History:  Procedure Laterality Date   HAIR TRANSPLANT     KNEE SURGERY Right 07/2021   left shoulder repair Left 05/2021    Family History  Problem Relation Age of Onset   Diabetes Mother    High blood pressure Mother    High Cholesterol Mother    Kidney disease Mother    Thyroid  disease Mother    Sleep apnea Mother    Obesity Mother    COPD Father    Heart disease Father    Liver disease Father    Depression Father    Other Brother        low testosterone   Colon cancer Neg Hx    Rectal cancer Neg Hx    Stomach cancer Neg Hx    Esophageal cancer Neg Hx     Allergies  Allergen Reactions   Grass Pollen(K-O-R-T-Swt  Vern) Cough    Current Outpatient Medications on File Prior to Visit  Medication Sig Dispense Refill   albuterol  (VENTOLIN  HFA) 108 (90 Base) MCG/ACT inhaler Inhale 2 puffs into the lungs every 6 (six) hours as needed. 18 g 0   cyclobenzaprine  (FLEXERIL ) 5 MG tablet 1 tab po q hs prn neck pain 4 tablet 0   fluticasone  (FLONASE ) 50 MCG/ACT nasal spray Place 2 sprays into both nostrils daily. 16 g 1   lisinopril  (ZESTRIL ) 5 MG tablet  Take 1 tablet (5 mg total) by mouth daily. 90 tablet 3   Multiple Vitamin (MULTIVITAMIN) tablet Take 1 tablet by mouth every other day.     neomycin -polymyxin-hydrocortisone (CORTISPORIN) OTIC solution Place 3 drops into the right ear 3 (three) times daily. 10 mL 0   Vitamin D , Ergocalciferol , (DRISDOL ) 1.25 MG (50000 UNIT) CAPS capsule TAKE 1 CAPSULE BY MOUTH EVERY 7 DAYS 8 capsule 0   No current facility-administered medications on file prior to visit.    BP 118/70   Pulse 68   Temp 98.2 F (36.8 C) (Oral)   Resp 17   Ht 5' 11 (1.803 m)   Wt 262 lb 3.2 oz (118.9 kg)   SpO2 96%   BMI 36.57 kg/m        Objective:   Physical Exam   General Mental Status- Alert. General Appearance- Not in acute distress.   Skin General: Color- Normal Color. Moisture- Normal Moisture.  Neck Carotid Arteries- Normal color. Moisture- Normal Moisture. No carotid bruits. No JVD.  Chest and Lung Exam Auscultation: Breath Sounds:-CTA  Cardiovascular Auscultation:Rythm- RRR Murmurs & Other Heart Sounds:Auscultation of the heart reveals- No Murmurs.  Abdomen Inspection:-Inspeection Normal. Palpation/Percussion:Note:No mass. Palpation and Percussion of the abdomen reveal- Non Tender, Non Distended + BS, no rebound or guarding.   Neurologic Cranial Nerve exam:- CN III-XII intact(No nystagmus), symmetric smile. Strength:- 5/5 equal and symmetric strength both upper and lower extremities.    Heent- Canals clear and normal tms.     Assessment & Plan:    Htn- bp is well controlled today and no bp med for 2 days. On review prior severe elevation may have been stress and maybe nsaids related use on discussion. Check bp daily over next week. If bp is trending over 140/90 then need to restart back on lisinopril  5 mg daily.  Ear pain resolved- post ear drops and antibiotics ear better and appearance improved.  Neck pain much improved with known known degenerative changes. - can refill your flexeril  5 mg #5 tab to use sparingly on occasion in combination with tylenol .  Vit d def -continue weekly 50,000 international units weekly and take additioal 5,000 international units daily otc.  -future vit d level place to be done in 5-6 weeks.  Follow up date to be determined after blood pressure review  update in 7-10 days and vit d level review.   Aiana Nordquist, PA-C

## 2024-08-15 NOTE — Patient Instructions (Addendum)
 Htn- bp is well controlled today and no bp med for 2 days. On review prior severe elevation may have been stress and maybe nsaids related use on discussion. Check bp daily over next week. If bp is trending over 140/90 then need to restart back on lisinopril  5 mg daily.  Ear pain resolved- post ear drops and antibiotics ear better and appearance improved.  Neck pain much improved with known known degenerative changes. - can refill your flexeril  5 mg #5 tab to use sparingly on occasion in combination with tylenol .  Vit d def -continue weekly 50,000 international units weekly and take additioal 5,000 international units daily otc.  -future vit d level place to be done in 5-6 weeks.  Follow up date to be determined after blood pressure review  update in 7-10 days and vit d level review.

## 2024-08-26 ENCOUNTER — Encounter: Payer: Self-pay | Admitting: Medical

## 2024-08-26 ENCOUNTER — Other Ambulatory Visit: Payer: Self-pay | Admitting: Medical Genetics

## 2024-09-21 ENCOUNTER — Other Ambulatory Visit (INDEPENDENT_AMBULATORY_CARE_PROVIDER_SITE_OTHER)

## 2024-09-21 DIAGNOSIS — E559 Vitamin D deficiency, unspecified: Secondary | ICD-10-CM | POA: Diagnosis not present

## 2024-09-21 NOTE — Addendum Note (Signed)
 Addended by: ROSEBUD NEST A on: 09/21/2024 02:48 PM   Modules accepted: Orders

## 2024-09-22 ENCOUNTER — Ambulatory Visit: Payer: Self-pay | Admitting: Medical

## 2024-09-22 LAB — VITAMIN D 25 HYDROXY (VIT D DEFICIENCY, FRACTURES): Vit D, 25-Hydroxy: 49 ng/mL (ref 30–100)

## 2024-09-22 MED ORDER — VITAMIN D (ERGOCALCIFEROL) 1.25 MG (50000 UNIT) PO CAPS: 50000.0000 [IU] | ORAL_CAPSULE | ORAL | 3 refills | Status: AC

## 2024-09-22 NOTE — Addendum Note (Signed)
 Addended by: DORINA DALLAS HERO on: 09/22/2024 09:22 AM   Modules accepted: Orders

## 2024-10-07 ENCOUNTER — Telehealth: Payer: Self-pay | Admitting: Family

## 2024-10-07 DIAGNOSIS — B9689 Other specified bacterial agents as the cause of diseases classified elsewhere: Secondary | ICD-10-CM

## 2024-10-07 DIAGNOSIS — J208 Acute bronchitis due to other specified organisms: Secondary | ICD-10-CM

## 2024-10-07 MED ORDER — BENZONATATE 200 MG PO CAPS: 200.0000 mg | ORAL_CAPSULE | Freq: Two times a day (BID) | ORAL | 0 refills | Status: AC | PRN

## 2024-10-07 MED ORDER — DOXYCYCLINE HYCLATE 100 MG PO TABS: 100.0000 mg | ORAL_TABLET | Freq: Two times a day (BID) | ORAL | 0 refills | Status: DC

## 2024-10-07 MED ORDER — PROMETHAZINE-DM 6.25-15 MG/5ML PO SYRP: 5.0000 mL | ORAL_SOLUTION | Freq: Three times a day (TID) | ORAL | 0 refills | Status: AC | PRN

## 2024-10-07 NOTE — Progress Notes (Signed)
 Virtual Visit Consent   Brian Obrien, you are scheduled for a virtual visit with a [facility] provider today. Just as with appointments in the office, your consent must be obtained to participate. Your consent will be active for this visit and any virtual visit you may have with one of our providers in the next 365 days. If you have a MyChart account, a copy of this consent can be sent to you electronically.  As this is a virtual visit, video technology does not allow for your provider to perform a traditional examination. This may limit your provider's ability to fully assess your condition. If your provider identifies any concerns that need to be evaluated in person or the need to arrange testing (such as labs, EKG, etc.), we will make arrangements to do so. Although advances in technology are sophisticated, we cannot ensure that it will always work on either your end or our end. If the connection with a video visit is poor, the visit may have to be switched to a telephone visit. With either a video or telephone visit, we are not always able to ensure that we have a secure connection.  By engaging in this virtual visit, you consent to the provision of healthcare and authorize for your insurance to be billed (if applicable) for the services provided during this visit. Depending on your insurance coverage, you may receive a charge related to this service.  I need to obtain your verbal consent now. Are you willing to proceed with your visit today? Brian Obrien has provided verbal consent on 10/07/2024 for a virtual visit (video or telephone). Bari Learn, FNP  Date: 10/07/2024 5:07 PM   Virtual Visit via Video Note   I, Bari Learn, connected with  Brian Obrien  ([id number], 12-14-1985) on 10/07/24 at  5:00 PM EST by a video-enabled telemedicine application and verified that I am speaking with the correct person using two identifiers.  Location: Patient: Virtual Visit Location Patient:  Home Provider: Virtual Visit Location Provider: Home Office   I discussed the limitations of evaluation and management by telemedicine and the availability of in person appointments. The patient expressed understanding and agreed to proceed.    History of Present Illness: Brian Obrien is a 38 y.o. who identifies as a male who was assigned male at birth, and is being seen today for cough for the last week that is worsening.  HPI: Cough This is a new problem. The current episode started 1 to 4 weeks ago. The problem has been gradually worsening. The problem occurs every few minutes. The cough is Productive of brown sputum. Associated symptoms include chills, a fever, myalgias, nasal congestion and postnasal drip. Pertinent negatives include no ear congestion, ear pain, shortness of breath or wheezing. He has tried rest for the symptoms. The treatment provided mild relief.    Problems:  Patient Active Problem List   Diagnosis Date Noted   Gastroesophageal reflux disease with esophagitis without hemorrhage 07/07/2022   Nasal sinus congestion 07/07/2022   Tinnitus of both ears 07/07/2022   Capsulitis of right shoulder 06/30/2022   Carpal tunnel syndrome, right 06/30/2022   Closed traumatic dislocation of acromioclavicular joint 01/13/2022   Tear of PCL (posterior cruciate ligament) of knee, left, subsequent encounter 07/08/2021   Rupture of anterior cruciate ligament of right knee 07/08/2021   Tear of LCL (lateral collateral ligament) of knee, right, initial encounter 07/08/2021   Anxiety 07/08/2021   Acromioclavicular joint injury, left, initial encounter 05/14/2021   Low testosterone  04/02/2021   Polyphagia 03/24/2021   Seasonal allergies 11/27/2020   Other hyperlipidemia 11/04/2020   Depression 09/01/2020   Vitamin D  deficiency 08/14/2020   Insulin  resistance 07/07/2020   Pain of joint of left ankle and foot 05/09/2020   Joint pain 04/03/2020   Metabolic syndrome 12/25/2019    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   Cervical radiculopathy 11/12/2019   Chronic pain of left knee 11/12/2019   Class 2 severe obesity with serious comorbidity and body mass index (BMI) of 36.0 to 36.9 in adult 11/12/2019   Left sided sciatica 03/15/2018   Lytic bone lesions on xray 02/07/2018   Dependence on nicotine from chewing tobacco 12/15/2017   Obesity (BMI 30-39.9) 12/15/2017   OSA (obstructive sleep apnea) 12/15/2017    Allergies:  Allergies  Allergen Reactions   Grass Pollen(K-O-R-T-Swt Vern) Cough   Medications:  Current Outpatient Medications:    benzonatate  (TESSALON ) 200 MG capsule, Take 1 capsule (200 mg total) by mouth 2 (two) times daily as needed for cough., Disp: 20 capsule, Rfl: 0   doxycycline  (VIBRA -TABS) 100 MG tablet, Take 1 tablet (100 mg total) by mouth 2 (two) times daily., Disp: 20 tablet, Rfl: 0   promethazine -dextromethorphan (PROMETHAZINE -DM) 6.25-15 MG/5ML syrup, Take 5 mLs by mouth 3 (three) times daily as needed for cough., Disp: 118 mL, Rfl: 0   albuterol  (VENTOLIN  HFA) 108 (90 Base) MCG/ACT inhaler, Inhale 2 puffs into the lungs every 6 (six) hours as needed., Disp: 18 g, Rfl: 0   cyclobenzaprine  (FLEXERIL ) 5 MG tablet, 1 tab po q hs prn neck pain, Disp: 5 tablet, Rfl: 0   fluticasone  (FLONASE ) 50 MCG/ACT nasal spray, Place 2 sprays into both nostrils daily., Disp: 16 g, Rfl: 1   lisinopril  (ZESTRIL ) 5 MG tablet, Take 1 tablet (5 mg total) by mouth daily., Disp: 90 tablet, Rfl: 3   Multiple Vitamin (MULTIVITAMIN) tablet, Take 1 tablet by mouth every other day., Disp: , Rfl:    neomycin -polymyxin-hydrocortisone (CORTISPORIN) OTIC solution, Place 3 drops into the right ear 3 (three) times daily., Disp: 10 mL, Rfl: 0   Vitamin D , Ergocalciferol , (DRISDOL ) 1.25 MG (50000 UNIT) CAPS capsule, TAKE 1 CAPSULE BY MOUTH EVERY 7 DAYS, Disp: 8 capsule, Rfl: 0   Vitamin D , Ergocalciferol , (DRISDOL ) 1.25 MG (50000 UNIT) CAPS capsule,  Take 1 capsule (50,000 Units total) by mouth every 7 (seven) days., Disp: 12 capsule, Rfl: 3  Observations/Objective: Patient is well-developed, well-nourished in no acute distress.  Resting comfortably  at home.  Head is normocephalic, atraumatic.  No labored breathing.  Speech is clear and coherent with logical content.  Patient is alert and oriented at baseline.  Coarse cough Nasal congestion  Assessment and Plan: 1. Acute bacterial bronchitis (Primary) - doxycycline  (VIBRA -TABS) 100 MG tablet; Take 1 tablet (100 mg total) by mouth 2 (two) times daily.  Dispense: 20 tablet; Refill: 0 - promethazine -dextromethorphan (PROMETHAZINE -DM) 6.25-15 MG/5ML syrup; Take 5 mLs by mouth 3 (three) times daily as needed for cough.  Dispense: 118 mL; Refill: 0 - benzonatate  (TESSALON ) 200 MG capsule; Take 1 capsule (200 mg total) by mouth 2 (two) times daily as needed for cough.  Dispense: 20 capsule; Refill: 0  - Take meds as prescribed - Use a cool mist humidifier  -Use saline nose sprays frequently -Force fluids -For any cough or congestion  Use plain Mucinex - regular strength or max strength is fine -For fever or aces or pains- take tylenol  or ibuprofen. -Throat lozenges if help -Follow  up if symptoms worsen or do not improve   Follow Up Instructions: I discussed the assessment and treatment plan with the patient. The patient was provided an opportunity to ask questions and all were answered. The patient agreed with the plan and demonstrated an understanding of the instructions.  A copy of instructions were sent to the patient via MyChart unless otherwise noted below.     The patient was advised to call back or seek an in-person evaluation if the symptoms worsen or if the condition fails to improve as anticipated.    Bari Learn, FNP

## 2024-10-12 ENCOUNTER — Ambulatory Visit (INDEPENDENT_AMBULATORY_CARE_PROVIDER_SITE_OTHER): Payer: Self-pay | Admitting: Medical

## 2024-10-12 ENCOUNTER — Ambulatory Visit (HOSPITAL_BASED_OUTPATIENT_CLINIC_OR_DEPARTMENT_OTHER)
Admission: RE | Admit: 2024-10-12 | Discharge: 2024-10-12 | Disposition: A | Discharge status: Home / Self Care | Source: Ambulatory Visit | Attending: Medical | Admitting: Medical

## 2024-10-12 ENCOUNTER — Ambulatory Visit: Payer: Self-pay | Admitting: Medical

## 2024-10-12 VITALS — BP 118/78 | HR 86 | Temp 98.0°F | Resp 15 | Ht 71.0 in | Wt 256.0 lb

## 2024-10-12 DIAGNOSIS — R051 Acute cough: Secondary | ICD-10-CM

## 2024-10-12 DIAGNOSIS — H6690 Otitis media, unspecified, unspecified ear: Secondary | ICD-10-CM | POA: Diagnosis not present

## 2024-10-12 DIAGNOSIS — J40 Bronchitis, not specified as acute or chronic: Secondary | ICD-10-CM

## 2024-10-12 DIAGNOSIS — J029 Acute pharyngitis, unspecified: Secondary | ICD-10-CM

## 2024-10-12 LAB — POCT INFLUENZA A/B
Influenza A, POC: NEGATIVE
Influenza B, POC: NEGATIVE

## 2024-10-12 LAB — POC COVID19 BINAXNOW: SARS Coronavirus 2 Ag: NEGATIVE

## 2024-10-12 LAB — POCT RAPID STREP A (OFFICE): Rapid Strep A Screen: NEGATIVE

## 2024-10-12 MED ORDER — AMOXICILLIN-POT CLAVULANATE 875-125 MG PO TABS: 1.0000 | ORAL_TABLET | Freq: Two times a day (BID) | ORAL | 0 refills | Status: AC

## 2024-10-12 NOTE — Patient Instructions (Signed)
 Acute productive cough, pharyngitis, and bronchitis Symptoms suggest viral syndrome with possible secondary bacterial infection. Doxycycline  ineffective. Negative COVID, strep, and influenza tests. Throat shows moderate redness. Possible ear infection bilaterally as well. - Ordered chest x-ray to evaluate lung infection./pneumonia - Continue benzonatate  for cough. - Use Flonase  as needed. - Use albuterol  if wheezing occurs. - Consider azithromycin  or Augmentin  based on x-ray results.  Follow up in 7-10 days or sooner if needed

## 2024-10-12 NOTE — Progress Notes (Signed)
 Subjective:    Patient ID: Brian Obrien, male    DOB: 1986-10-15, 38 y.o.   MRN: [id number]  HPI Taron Drost is a 38 year old male who presents with a persistent cough and recent history of fever.  Symptoms began ten to eleven days ago with a mild throat itch, progressing to a fever by last Thursday night, which lasted until Saturday night and was managed with Tylenol . A productive cough has been present for six to seven days, with mucus occasionally containing blood. Currently, there is no fever, chills, or sweats. He denies ear pain, sinus pain, wheezing, or shortness of breath.  He had a video visit with urgent care on Sunday and was prescribed doxycycline , benzonatate , and Phenergan  cough syrup. He has been taking doxycycline  for six days without  improvement. He is currently taking benzonatate  twice daily.  COVID, strep, and influenza tests are negative. He uses Flonase  and albuterol  as needed.  Note patient describes for 2 or 3 days he had acute onset body aches fevers chills and sweats then other symptoms develop as those subsided.   Review of Systems  Constitutional:  Positive for fatigue. Negative for chills and fever.  HENT:  Positive for congestion. Negative for ear pain.   Respiratory:  Positive for cough. Negative for shortness of breath and wheezing.   Cardiovascular:  Negative for chest pain and palpitations.  Gastrointestinal:  Negative for abdominal pain.  Genitourinary:  Negative for frequency.  Musculoskeletal:  Negative for back pain.  Skin:  Negative for rash.  Neurological:  Negative for dizziness.  Hematological:  Negative for adenopathy. Does not bruise/bleed easily.  Psychiatric/Behavioral:  Negative for behavioral problems.     Past Medical History:  Diagnosis Date   Allergy    Anxiety    Chest pain    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   Chronic neck pain 11/12/2019   Chronic pain of left knee 11/12/2019    Dependence on nicotine from chewing tobacco 12/15/2017   Depression    Fatty liver    GERD (gastroesophageal reflux disease)    Hyperlipidemia    Left hip pain    Left sided sciatica 03/15/2018   Lytic bone lesions on xray 02/07/2018   Formatting of this note might be different from the original. Of right iliac crest.  Seen on x-ray in 1-19 and CT on 02/05/2018. Unchanged xray in 03/2018. Repeat in 6 months (CT vs Xray)   Metabolic syndrome 12/25/2019   Obesity (BMI 30-39.9) 12/15/2017   OSA (obstructive sleep apnea) 12/15/2017   2015; unable to tolerate CPAP   Palpitations    Severe obesity (BMI 35.0-39.9) with comorbidity (HCC) 11/12/2019   Sleep apnea    SOB (shortness of breath)    Vitamin B 12 deficiency    Vitamin D  deficiency      Social History   Socioeconomic History   Marital status: Married    Spouse name: Not on file   Number of children: Not on file   Years of education: Not on file   Highest education level: Not on file  Occupational History   Occupation: mri tech  Tobacco Use   Smoking status: Former    Current packs/day: 0.00    Types: Cigarettes    Quit date: 03/06/2012    Years since quitting: 12.6   Smokeless tobacco: Former    Types: Chew    Quit date: 03/17/2016  Vaping Use   Vaping status: Former  Substance and Sexual  Activity   Alcohol use: Not Currently   Drug use: Never   Sexual activity: Not on file  Other Topics Concern   Not on file  Social History Narrative   Not on file   Social Drivers of Health   Financial Resource Strain: Not on file  Food Insecurity: Not on file  Transportation Needs: Not on file  Physical Activity: Not on file  Stress: Not on file  Social Connections: Unknown (03/26/2022)   Received from [facility]   Social Network    Social Network: Not on file  Intimate Partner Violence: Unknown (02/26/2022)   Received from [facility]   HITS    Physically Hurt: Not on file    Insult or Talk Down To: Not on file    Threaten  Physical Harm: Not on file    Scream or Curse: Not on file    Past Surgical History:  Procedure Laterality Date   HAIR TRANSPLANT     KNEE SURGERY Right 07/2021   left shoulder repair Left 05/2021    Family History  Problem Relation Age of Onset   Diabetes Mother    High blood pressure Mother    High Cholesterol Mother    Kidney disease Mother    Thyroid  disease Mother    Sleep apnea Mother    Obesity Mother    COPD Father    Heart disease Father    Liver disease Father    Depression Father    Other Brother        low testosterone   Colon cancer Neg Hx    Rectal cancer Neg Hx    Stomach cancer Neg Hx    Esophageal cancer Neg Hx     Allergies  Allergen Reactions   Grass Pollen(K-O-R-T-Swt Vern) Cough    Current Outpatient Medications on File Prior to Visit  Medication Sig Dispense Refill   albuterol  (VENTOLIN  HFA) 108 (90 Base) MCG/ACT inhaler Inhale 2 puffs into the lungs every 6 (six) hours as needed. 18 g 0   benzonatate  (TESSALON ) 200 MG capsule Take 1 capsule (200 mg total) by mouth 2 (two) times daily as needed for cough. 20 capsule 0   cyclobenzaprine  (FLEXERIL ) 5 MG tablet 1 tab po q hs prn neck pain 5 tablet 0   fluticasone  (FLONASE ) 50 MCG/ACT nasal spray Place 2 sprays into both nostrils daily. 16 g 1   lisinopril  (ZESTRIL ) 5 MG tablet Take 1 tablet (5 mg total) by mouth daily. 90 tablet 3   Multiple Vitamin (MULTIVITAMIN) tablet Take 1 tablet by mouth every other day.     neomycin -polymyxin-hydrocortisone (CORTISPORIN) OTIC solution Place 3 drops into the right ear 3 (three) times daily. 10 mL 0   promethazine -dextromethorphan (PROMETHAZINE -DM) 6.25-15 MG/5ML syrup Take 5 mLs by mouth 3 (three) times daily as needed for cough. 118 mL 0   Vitamin D , Ergocalciferol , (DRISDOL ) 1.25 MG (50000 UNIT) CAPS capsule TAKE 1 CAPSULE BY MOUTH EVERY 7 DAYS 8 capsule 0   Vitamin D , Ergocalciferol , (DRISDOL ) 1.25 MG (50000 UNIT) CAPS capsule Take 1 capsule (50,000 Units  total) by mouth every 7 (seven) days. 12 capsule 3   No current facility-administered medications on file prior to visit.    BP 118/78   Pulse 86   Temp 98 F (36.7 C) (Oral)   Resp 15   Ht 5' 11 (1.803 m)   Wt 256 lb (116.1 kg)   SpO2 95%   BMI 35.70 kg/m  Objective:   Physical Exam  General- No acute distress. Pleasant patient. Neck- Full range of motion, no jvd Lungs- Clear, even and unlabored. Heart- regular rate and rhythm. Neurologic- CNII- XII grossly intact.  Heent-send of nasal congestion but no sinus pressure on palpation.  Both ears canals are clear but TMs bilaterally moderate red.  Posterior pharynx moderate red but no tonsil hypertrophy.  No lymphadenopathy.      Assessment & Plan:   Acute productive cough, pharyngitis, and bronchitis Symptoms suggest viral syndrome with possible secondary bacterial infection. Doxycycline  ineffective. Negative COVID, strep, and influenza tests. Throat shows moderate redness. Possible ear infection bilaterally as well. - Ordered chest x-ray to evaluate lung infection./pneumonia - Continue benzonatate  for cough. - Use Flonase  as needed. - Use albuterol  if wheezing occurs. - Consider azithromycin  or Augmentin  based on x-ray results.  Follow up in 7-10 days or sooner if needed

## 2024-11-21 ENCOUNTER — Other Ambulatory Visit (HOSPITAL_COMMUNITY)

## 2024-12-06 ENCOUNTER — Other Ambulatory Visit (HOSPITAL_COMMUNITY)

## 2025-01-15 ENCOUNTER — Other Ambulatory Visit (HOSPITAL_COMMUNITY)
